# Patient Record
Sex: Female | Born: 1945
Health system: Southern US, Community
[De-identification: ages and names within clinical notes are randomized; demographics above are authoritative.]

## PROBLEM LIST (undated history)

## (undated) DIAGNOSIS — T7840XA Allergy, unspecified, initial encounter: Secondary | ICD-10-CM

## (undated) DIAGNOSIS — E785 Hyperlipidemia, unspecified: Secondary | ICD-10-CM

## (undated) DIAGNOSIS — M1990 Unspecified osteoarthritis, unspecified site: Secondary | ICD-10-CM

## (undated) DIAGNOSIS — R7303 Prediabetes: Secondary | ICD-10-CM

## (undated) DIAGNOSIS — I10 Essential (primary) hypertension: Secondary | ICD-10-CM

## (undated) HISTORY — DX: Hyperlipidemia, unspecified: E78.5

## (undated) HISTORY — DX: Prediabetes: R73.03

## (undated) HISTORY — DX: Allergy, unspecified, initial encounter: T78.40XA

## (undated) HISTORY — PX: NO PAST SURGERIES: SHX2092

## (undated) HISTORY — PX: JOINT REPLACEMENT: SHX530

## (undated) HISTORY — PX: POLYPECTOMY: SHX149

## (undated) HISTORY — PX: COLONOSCOPY: SHX174

## (undated) HISTORY — DX: Essential (primary) hypertension: I10

## (undated) HISTORY — DX: Unspecified osteoarthritis, unspecified site: M19.90

---

## 1997-12-05 ENCOUNTER — Other Ambulatory Visit: Admission: RE | Admit: 1997-12-05 | Discharge: 1997-12-05 | Payer: Self-pay | Admitting: Obstetrics & Gynecology

## 1998-08-17 ENCOUNTER — Encounter: Admission: RE | Admit: 1998-08-17 | Discharge: 1998-10-23 | Payer: Self-pay | Admitting: Family Medicine

## 1999-02-15 ENCOUNTER — Other Ambulatory Visit: Admission: RE | Admit: 1999-02-15 | Discharge: 1999-02-15 | Payer: Self-pay | Admitting: Obstetrics and Gynecology

## 1999-02-15 ENCOUNTER — Encounter (INDEPENDENT_AMBULATORY_CARE_PROVIDER_SITE_OTHER): Payer: Self-pay

## 2000-02-25 ENCOUNTER — Other Ambulatory Visit: Admission: RE | Admit: 2000-02-25 | Discharge: 2000-02-25 | Payer: Self-pay | Admitting: Obstetrics and Gynecology

## 2000-04-10 ENCOUNTER — Encounter (INDEPENDENT_AMBULATORY_CARE_PROVIDER_SITE_OTHER): Payer: Self-pay | Admitting: Specialist

## 2000-04-10 ENCOUNTER — Other Ambulatory Visit: Admission: RE | Admit: 2000-04-10 | Discharge: 2000-04-10 | Payer: Self-pay | Admitting: Internal Medicine

## 2001-02-27 ENCOUNTER — Other Ambulatory Visit: Admission: RE | Admit: 2001-02-27 | Discharge: 2001-02-27 | Payer: Self-pay

## 2002-03-15 ENCOUNTER — Other Ambulatory Visit: Admission: RE | Admit: 2002-03-15 | Discharge: 2002-03-15 | Payer: Self-pay | Admitting: Obstetrics and Gynecology

## 2003-04-13 ENCOUNTER — Other Ambulatory Visit: Admission: RE | Admit: 2003-04-13 | Discharge: 2003-04-13 | Payer: Self-pay | Admitting: Obstetrics and Gynecology

## 2004-06-13 ENCOUNTER — Other Ambulatory Visit: Admission: RE | Admit: 2004-06-13 | Discharge: 2004-06-13 | Payer: Self-pay | Admitting: Obstetrics and Gynecology

## 2004-06-26 ENCOUNTER — Ambulatory Visit: Payer: Self-pay | Admitting: Family Medicine

## 2004-07-31 ENCOUNTER — Ambulatory Visit: Payer: Self-pay | Admitting: Family Medicine

## 2004-08-07 ENCOUNTER — Ambulatory Visit: Payer: Self-pay | Admitting: Family Medicine

## 2005-04-05 ENCOUNTER — Ambulatory Visit: Payer: Self-pay | Admitting: Internal Medicine

## 2005-04-19 ENCOUNTER — Ambulatory Visit: Payer: Self-pay | Admitting: Internal Medicine

## 2005-06-26 ENCOUNTER — Ambulatory Visit: Payer: Self-pay | Admitting: Family Medicine

## 2005-07-02 ENCOUNTER — Other Ambulatory Visit: Admission: RE | Admit: 2005-07-02 | Discharge: 2005-07-02 | Payer: Self-pay | Admitting: Obstetrics and Gynecology

## 2005-07-04 ENCOUNTER — Ambulatory Visit: Payer: Self-pay | Admitting: Family Medicine

## 2006-03-24 ENCOUNTER — Ambulatory Visit: Payer: Self-pay | Admitting: Family Medicine

## 2006-06-19 ENCOUNTER — Ambulatory Visit: Payer: Self-pay | Admitting: Family Medicine

## 2006-06-19 LAB — CONVERTED CEMR LAB
ALT: 13 units/L (ref 0–40)
Albumin: 3.8 g/dL (ref 3.5–5.2)
Alkaline Phosphatase: 70 units/L (ref 39–117)
BUN: 16 mg/dL (ref 6–23)
Basophils Absolute: 0 10*3/uL (ref 0.0–0.1)
Basophils Relative: 0.8 % (ref 0.0–1.0)
CO2: 32 meq/L (ref 19–32)
Chol/HDL Ratio, serum: 3.9
GFR calc non Af Amer: 54 mL/min
Glomerular Filtration Rate, Af Am: 65 mL/min/{1.73_m2}
Glucose, Bld: 95 mg/dL (ref 70–99)
LDL DIRECT: 154.5 mg/dL
Monocytes Relative: 11.1 % — ABNORMAL HIGH (ref 3.0–11.0)
Platelets: 236 10*3/uL (ref 150–400)
Potassium: 4.3 meq/L (ref 3.5–5.1)
RBC: 4.13 M/uL (ref 3.87–5.11)
RDW: 13.2 % (ref 11.5–14.6)
Total Bilirubin: 1.3 mg/dL — ABNORMAL HIGH (ref 0.3–1.2)
Total Protein: 7.4 g/dL (ref 6.0–8.3)
Triglyceride fasting, serum: 88 mg/dL (ref 0–149)
WBC: 5.1 10*3/uL (ref 4.5–10.5)

## 2006-06-26 ENCOUNTER — Ambulatory Visit: Payer: Self-pay | Admitting: Family Medicine

## 2006-07-10 ENCOUNTER — Ambulatory Visit: Payer: Self-pay | Admitting: Family Medicine

## 2007-01-13 ENCOUNTER — Encounter: Payer: Self-pay | Admitting: Family Medicine

## 2007-08-19 ENCOUNTER — Ambulatory Visit: Payer: Self-pay | Admitting: Family Medicine

## 2007-08-19 LAB — CONVERTED CEMR LAB
Albumin: 4 g/dL (ref 3.5–5.2)
Basophils Absolute: 0 10*3/uL (ref 0.0–0.1)
Bilirubin Urine: NEGATIVE
CO2: 31 meq/L (ref 19–32)
Creatinine, Ser: 0.9 mg/dL (ref 0.4–1.2)
Direct LDL: 136.6 mg/dL
Glucose, Urine, Semiquant: NEGATIVE
HCT: 36.5 % (ref 36.0–46.0)
Hemoglobin: 12.1 g/dL (ref 12.0–15.0)
Lymphocytes Relative: 28 % (ref 12.0–46.0)
MCHC: 33.2 g/dL (ref 30.0–36.0)
Monocytes Absolute: 0.5 10*3/uL (ref 0.2–0.7)
Neutro Abs: 2.7 10*3/uL (ref 1.4–7.7)
Neutrophils Relative %: 56.5 % (ref 43.0–77.0)
Potassium: 4.4 meq/L (ref 3.5–5.1)
Protein, U semiquant: NEGATIVE
RDW: 12.7 % (ref 11.5–14.6)
Sodium: 142 meq/L (ref 135–145)
Specific Gravity, Urine: 1.015
TSH: 0.98 microintl units/mL (ref 0.35–5.50)
Total Bilirubin: 1 mg/dL (ref 0.3–1.2)
Total CHOL/HDL Ratio: 3.7
Total Protein: 7 g/dL (ref 6.0–8.3)
WBC Urine, dipstick: NEGATIVE
pH: 7

## 2007-09-10 ENCOUNTER — Ambulatory Visit: Payer: Self-pay | Admitting: Family Medicine

## 2007-09-10 DIAGNOSIS — I10 Essential (primary) hypertension: Secondary | ICD-10-CM | POA: Insufficient documentation

## 2008-10-28 ENCOUNTER — Ambulatory Visit: Payer: Self-pay | Admitting: Family Medicine

## 2008-10-28 LAB — CONVERTED CEMR LAB
ALT: 15 units/L (ref 0–35)
AST: 21 units/L (ref 0–37)
Albumin: 4 g/dL (ref 3.5–5.2)
Alkaline Phosphatase: 81 units/L (ref 39–117)
BUN: 19 mg/dL (ref 6–23)
Basophils Absolute: 0 10*3/uL (ref 0.0–0.1)
Basophils Relative: 0.7 % (ref 0.0–3.0)
Bilirubin, Direct: 0.1 mg/dL (ref 0.0–0.3)
CO2: 31 meq/L (ref 19–32)
Calcium: 9.6 mg/dL (ref 8.4–10.5)
Chloride: 108 meq/L (ref 96–112)
Cholesterol: 253 mg/dL — ABNORMAL HIGH (ref 0–200)
Creatinine, Ser: 0.9 mg/dL (ref 0.4–1.2)
Direct LDL: 146.5 mg/dL
Eosinophils Absolute: 0.2 10*3/uL (ref 0.0–0.7)
Eosinophils Relative: 3.6 % (ref 0.0–5.0)
GFR calc non Af Amer: 67.15 mL/min (ref >60)
Glucose, Bld: 96 mg/dL (ref 70–99)
HCT: 37.4 % (ref 36.0–46.0)
HDL: 50.9 mg/dL (ref >39.00)
Hemoglobin: 12.9 g/dL (ref 12.0–15.0)
Lymphocytes Relative: 30.3 % (ref 12.0–46.0)
Lymphs Abs: 1.3 10*3/uL (ref 0.7–4.0)
MCHC: 34.6 g/dL (ref 30.0–36.0)
MCV: 91.5 fL (ref 78.0–100.0)
Monocytes Absolute: 0.5 10*3/uL (ref 0.1–1.0)
Monocytes Relative: 11.9 % (ref 3.0–12.0)
Neutro Abs: 2.4 10*3/uL (ref 1.4–7.7)
Neutrophils Relative %: 53.5 % (ref 43.0–77.0)
Nitrite: NEGATIVE
Platelets: 222 10*3/uL (ref 150.0–400.0)
Potassium: 4 meq/L (ref 3.5–5.1)
RBC: 4.09 M/uL (ref 3.87–5.11)
RDW: 12.6 % (ref 11.5–14.6)
Sodium: 142 meq/L (ref 135–145)
Specific Gravity, Urine: 1.015
TSH: 1.01 microintl units/mL (ref 0.35–5.50)
Total Bilirubin: 1 mg/dL (ref 0.3–1.2)
Total CHOL/HDL Ratio: 5
Total Protein: 7.7 g/dL (ref 6.0–8.3)
Triglycerides: 203 mg/dL — ABNORMAL HIGH (ref 0.0–149.0)
VLDL: 40.6 mg/dL — ABNORMAL HIGH (ref 0.0–40.0)
WBC: 4.4 10*3/uL — ABNORMAL LOW (ref 4.5–10.5)

## 2008-11-17 ENCOUNTER — Ambulatory Visit: Payer: Self-pay | Admitting: Family Medicine

## 2008-11-17 DIAGNOSIS — I4949 Other premature depolarization: Secondary | ICD-10-CM

## 2008-11-27 DIAGNOSIS — B029 Zoster without complications: Secondary | ICD-10-CM | POA: Insufficient documentation

## 2008-11-27 HISTORY — DX: Zoster without complications: B02.9

## 2008-11-30 ENCOUNTER — Telehealth: Payer: Self-pay | Admitting: *Deleted

## 2008-11-30 ENCOUNTER — Ambulatory Visit: Payer: Self-pay | Admitting: Family Medicine

## 2008-12-02 ENCOUNTER — Ambulatory Visit: Payer: Self-pay | Admitting: Family Medicine

## 2009-03-03 ENCOUNTER — Encounter: Payer: Self-pay | Admitting: Family Medicine

## 2010-02-19 ENCOUNTER — Ambulatory Visit: Payer: Self-pay | Admitting: Family Medicine

## 2010-02-19 LAB — CONVERTED CEMR LAB
ALT: 13 units/L (ref 0–35)
Albumin: 3.9 g/dL (ref 3.5–5.2)
Basophils Relative: 0.6 % (ref 0.0–3.0)
Bilirubin Urine: NEGATIVE
CO2: 30 meq/L (ref 19–32)
Chloride: 102 meq/L (ref 96–112)
Cholesterol: 261 mg/dL — ABNORMAL HIGH (ref 0–200)
Direct LDL: 178.2 mg/dL
Eosinophils Absolute: 0.2 10*3/uL (ref 0.0–0.7)
Eosinophils Relative: 3.3 % (ref 0.0–5.0)
HCT: 37.3 % (ref 36.0–46.0)
Hemoglobin: 12.9 g/dL (ref 12.0–15.0)
MCHC: 34.4 g/dL (ref 30.0–36.0)
MCV: 92.6 fL (ref 78.0–100.0)
Monocytes Absolute: 0.7 10*3/uL (ref 0.1–1.0)
Neutro Abs: 2.6 10*3/uL (ref 1.4–7.7)
Nitrite: NEGATIVE
Potassium: 5 meq/L (ref 3.5–5.1)
RBC: 4.03 M/uL (ref 3.87–5.11)
Sodium: 140 meq/L (ref 135–145)
Total CHOL/HDL Ratio: 5
Total Protein: 6.9 g/dL (ref 6.0–8.3)
Urobilinogen, UA: 0.2
WBC: 4.9 10*3/uL (ref 4.5–10.5)

## 2010-02-26 ENCOUNTER — Encounter (INDEPENDENT_AMBULATORY_CARE_PROVIDER_SITE_OTHER): Payer: Self-pay | Admitting: *Deleted

## 2010-02-26 ENCOUNTER — Ambulatory Visit: Payer: Self-pay | Admitting: Family Medicine

## 2010-04-24 ENCOUNTER — Encounter: Payer: Self-pay | Admitting: Family Medicine

## 2010-04-27 ENCOUNTER — Encounter: Payer: Self-pay | Admitting: Family Medicine

## 2010-07-10 NOTE — Miscellaneous (Signed)
Summary: mammogram update   Clinical Lists Changes

## 2010-07-10 NOTE — Miscellaneous (Signed)
Summary: Waiver of Liability for Zostavax  Waiver of Liability for Zostavax   Imported By: Maryln Gottron 02/28/2010 10:45:23  _____________________________________________________________________  External Attachment:    Type:   Image     Comment:   External Document

## 2010-07-10 NOTE — Assessment & Plan Note (Signed)
Summary: CPX // RS   Vital Signs:  Patient profile:   65 year old female Menstrual status:  postmenopausal Height:      66.5 inches Weight:      178 pounds BMI:     28.40 Temp:     98.0 degrees F oral BP sitting:   180 / 90  (left arm) Cuff size:   regular  Vitals Entered By: Kern Reap CMA Duncan Dull) (February 26, 2010 9:08 AM) CC: cpx Is Patient Diabetic? No Pain Assessment Patient in pain? no        CC:  cpx.  History of Present Illness: Patricia Lee is a 65 year old, married female, nonsmoker, who comes in today for physical evaluation because of a history of underlying hypertension.  She takes Tenoretic 5025 dose one half tablet daily.  BP at home 130/80.  She gets routine eye care, dental care, BSE monthly, annual mammography, colonoscopy x 2 in GI normal,  tetanus this to 2007, Pneumovax 2010, patient requesting shingles.  Vaccine will be given today.  Her 3 issues were the concern about the shingles.  Vaccine.  Her blood pressure and her concern about family history of colon cancer.  She said to normal colonoscopies.  They recommend now 10-year screening.  We reviewed, why that's okay to be done every 10 years now  Preventive Screening-Counseling & Management  Alcohol-Tobacco     Smoking Status: quit     Year Quit: 20  Allergies: 1)  ! Codeine 2)  ! Prednisone 3)  ! Erythromycin  Past History:  Past medical, surgical, family and social histories (including risk factors) reviewed, and no changes noted (except as noted below).  Past Medical History: Reviewed history from 09/10/2007 and no changes required. childbirth x 2 Hypertension  Family History: Reviewed history from 09/10/2007 and no changes required. father died at 11 from colon cancer mother 61 died from stomach cancer and had bypass surgery.  Pernicious anemia, hypertension no sibs  Social History: Reviewed history from 09/10/2007 and no changes required. Retired Married Never Smoked Alcohol  use-no Drug use-no Regular exercise-yes Smoking Status:  quit  Review of Systems      See HPI       Flu Vaccine Consent Questions     Do you have a history of severe allergic reactions to this vaccine? no    Any prior history of allergic reactions to egg and/or gelatin? no    Do you have a sensitivity to the preservative Thimersol? no    Do you have a past history of Guillan-Barre Syndrome? no    Do you currently have an acute febrile illness? no    Have you ever had a severe reaction to latex? no    Vaccine information given and explained to patient? yes    Are you currently pregnant? no    Lot Number:[id number]   Exp Date:12/07/2009   Site Given  Left Deltoid IM   Physical Exam  General:  Well-developed,well-nourished,in no acute distress; alert,appropriate and cooperative throughout examination Head:  Normocephalic and atraumatic without obvious abnormalities. No apparent alopecia or balding. Eyes:  No corneal or conjunctival inflammation noted. EOMI. Perrla. Funduscopic exam benign, without hemorrhages, exudates or papilledema. Vision grossly normal. Ears:  External ear exam shows no significant lesions or deformities.  Otoscopic examination reveals clear canals, tympanic membranes are intact bilaterally without bulging, retraction, inflammation or discharge. Hearing is grossly normal bilaterally. Nose:  External nasal examination shows no deformity or inflammation. Nasal mucosa are pink and moist without  lesions or exudates. Mouth:  Oral mucosa and oropharynx without lesions or exudates.  Teeth in good repair. Neck:  No deformities, masses, or tenderness noted. Chest Wall:  No deformities, masses, or tenderness noted. Breasts:  No mass, nodules, thickening, tenderness, bulging, retraction, inflamation, nipple discharge or skin changes noted.   Lungs:  Normal respiratory effort, chest expands symmetrically. Lungs are clear to auscultation, no crackles or wheezes. Heart:  Normal  rate and regular rhythm. S1 and S2 normal without gallop, murmur, click, rub or other extra sounds. Abdomen:  Bowel sounds positive,abdomen soft and non-tender without masses, organomegaly or hernias noted. Msk:  No deformity or scoliosis noted of thoracic or lumbar spine.   Pulses:  R and L carotid,radial,femoral,dorsalis pedis and posterior tibial pulses are full and equal bilaterally Extremities:  No clubbing, cyanosis, edema, or deformity noted with normal full range of motion of all joints.   Neurologic:  No cranial nerve deficits noted. Station and gait are normal. Plantar reflexes are down-going bilaterally. DTRs are symmetrical throughout. Sensory, motor and coordinative functions appear intact. Skin:  Intact without suspicious lesions or rashes Cervical Nodes:  No lymphadenopathy noted Axillary Nodes:  No palpable lymphadenopathy Inguinal Nodes:  No significant adenopathy Psych:  Cognition and judgment appear intact. Alert and cooperative with normal attention span and concentration. No apparent delusions, illusions, hallucinations   Impression & Recommendations:  Problem # 1:  HYPERTENSION (ICD-401.9) Assessment Deteriorated  Her updated medication list for this problem includes:    Tenoretic 50 50-25 Mg Tabs (Atenolol-chlorthalidone) .Marland Kitchen... Take one half tablet daliy  Orders: Prescription Created Electronically [phone]) EKG w/ Interpretation (93000)  Problem # 2:  PHYSICAL EXAMINATION (ICD-V70.0) Assessment: Unchanged  Orders: Prescription Created Electronically [phone]) EKG w/ Interpretation (93000)  Complete Medication List: 1)  Tenoretic 50 50-25 Mg Tabs (Atenolol-chlorthalidone) .... Take one half tablet daliy 2)  Fish Oil Oil (Fish oil) .... Take one tab by mouth once daily 3)  Calcium 500 Mg Tabs (Calcium) .... Take two tabs once daily 4)  Vitamin D 1000 Unit Caps (Cholecalciferol) .... Take 2 tabs once daily 5)  Fiber Therapy 0.52 Gm Caps (Psyllium) .... Take one  tab by mouth once daily  Other Orders: Admin 1st Vaccine (10272) Flu Vaccine 63yrs + (53664) Zoster (Shingles) Vaccine Live (40347) Admin of Any Addtl Vaccine (42595)  Patient Instructions: 1)  Please schedule a follow-up appointment in 1 year. 2)  It is important that you exercise regularly at least 20 minutes 5 times a week. If you develop chest pain, have severe difficulty breathing, or feel very tired , stop exercising immediately and seek medical attention. 3)  Schedule your mammogram. 4)  Take calcium +Vitamin D daily. 5)  Take an Aspirin every day. 6)  check your blood pressure daily in the morning at home.  Return in one week with the data and the device Prescriptions: TENORETIC 50 50-25 MG  TABS (ATENOLOL-CHLORTHALIDONE) take one half tablet daliy  #100 x 4   Entered and Authorized by:   Roderick Pee MD   Signed by:   Roderick Pee MD on 02/26/2010   Method used:   Electronically to        [facility] [phone]* (retail)       [address]       [place], [place]  [address]       Ph: [phone]       Fax: [phone]   RxID:   [id number]    Immunizations  Administered:  Zostavax # 1:    Vaccine Type: Zostavax    Site: right deltoid    Mfr: Merck    Dose: 0.65    Route: [place]    Given by: Kern Reap CMA (AAMA)    Exp. Date: 01/26/2011    Lot #: [id number]    VIS given: 03/22/05 given February 26, 2010.    Physician counseled: yes

## 2010-07-10 NOTE — Miscellaneous (Signed)
Summary: bone density   Clinical Lists Changes  Observations: Added new observation of BONE DENSITY: normal (03/16/2010 12:13)      Preventive Care Screening  Bone Density:    Date:  03/16/2010    Results:  normal

## 2010-07-10 NOTE — Letter (Signed)
Summary: Colonoscopy Letter  [facility]  [address] [place], [place] [address]   Phone: [phone]  Fax: [phone]      February 26, 2010 MRN: [id number]   Patricia Lee [address] [place], [place]  [address]   Dear [name],   According to your medical record, it is time for you to schedule a Colonoscopy. The American Cancer Society recommends this procedure as a method to detect early colon cancer. Patients with a family history of colon cancer, or a personal history of colon polyps or inflammatory bowel disease are at increased risk.  This letter has beeen generated based on the recommendations made at the time of your procedure. If you feel that in your particular situation this may no longer apply, please contact our office.  Please call our office at [phone] to schedule this appointment or to update your records at your earliest convenience.  Thank you for cooperating with [place] to provide you with the very best care possible.   Sincerely,  Hedwig Morton. Juanda Chance, M.D.  [facility] [phone]

## 2010-12-03 ENCOUNTER — Encounter: Payer: Self-pay | Admitting: Internal Medicine

## 2010-12-27 ENCOUNTER — Ambulatory Visit (AMBULATORY_SURGERY_CENTER): Payer: Medicare Other | Admitting: *Deleted

## 2010-12-27 DIAGNOSIS — Z800 Family history of malignant neoplasm of digestive organs: Secondary | ICD-10-CM

## 2010-12-27 DIAGNOSIS — Z86010 Personal history of colonic polyps: Secondary | ICD-10-CM

## 2010-12-27 MED ORDER — PEG-KCL-NACL-NASULF-NA ASC-C 100 G PO SOLR: ORAL | Status: DC

## 2010-12-28 ENCOUNTER — Telehealth: Payer: Self-pay | Admitting: Internal Medicine

## 2010-12-28 NOTE — Telephone Encounter (Signed)
Moviprep called in to [facility] in [place].  Pt notified. Patricia Lee

## 2010-12-28 NOTE — Telephone Encounter (Signed)
Offered pt rebate for MoviPrep.  Will put in mail today. Patricia Lee

## 2011-01-10 ENCOUNTER — Encounter: Payer: Self-pay | Admitting: Internal Medicine

## 2011-01-10 ENCOUNTER — Ambulatory Visit (AMBULATORY_SURGERY_CENTER): Payer: Medicare Other | Admitting: Internal Medicine

## 2011-01-10 DIAGNOSIS — Z800 Family history of malignant neoplasm of digestive organs: Secondary | ICD-10-CM

## 2011-01-10 DIAGNOSIS — Z1211 Encounter for screening for malignant neoplasm of colon: Secondary | ICD-10-CM

## 2011-01-10 DIAGNOSIS — Z86010 Personal history of colonic polyps: Secondary | ICD-10-CM

## 2011-01-10 MED ORDER — SODIUM CHLORIDE 0.9 % IV SOLN: 500.0000 mL | INTRAVENOUS | Status: DC

## 2011-01-10 NOTE — Patient Instructions (Signed)
Discharge instructions given with verbal understanding. Handouts on diverticulosis and hemorrhoids given. Resume previous medications. 

## 2011-01-11 ENCOUNTER — Telehealth: Payer: Self-pay | Admitting: *Deleted

## 2011-01-11 NOTE — Telephone Encounter (Signed)

## 2011-06-19 ENCOUNTER — Encounter: Payer: Self-pay | Admitting: Family Medicine

## 2011-06-19 ENCOUNTER — Ambulatory Visit (INDEPENDENT_AMBULATORY_CARE_PROVIDER_SITE_OTHER): Payer: Medicare Other | Admitting: Family Medicine

## 2011-06-19 VITALS — BP 110/74 | Temp 98.2°F | Ht 66.5 in | Wt 182.0 lb

## 2011-06-19 DIAGNOSIS — I4949 Other premature depolarization: Secondary | ICD-10-CM

## 2011-06-19 DIAGNOSIS — I10 Essential (primary) hypertension: Secondary | ICD-10-CM

## 2011-06-19 DIAGNOSIS — Z0000 Encounter for general adult medical examination without abnormal findings: Secondary | ICD-10-CM

## 2011-06-19 LAB — HEPATIC FUNCTION PANEL
ALT: 14 U/L (ref 0–35)
Alkaline Phosphatase: 78 U/L (ref 39–117)
Bilirubin, Direct: 0 mg/dL (ref 0.0–0.3)
Total Protein: 7.6 g/dL (ref 6.0–8.3)

## 2011-06-19 LAB — POCT URINALYSIS DIPSTICK
Blood, UA: NEGATIVE
Glucose, UA: NEGATIVE
Nitrite, UA: NEGATIVE
Protein, UA: NEGATIVE
Urobilinogen, UA: 0.2

## 2011-06-19 LAB — BASIC METABOLIC PANEL
BUN: 21 mg/dL (ref 6–23)
Calcium: 9.5 mg/dL (ref 8.4–10.5)
Creatinine, Ser: 0.9 mg/dL (ref 0.4–1.2)
GFR: 69.25 mL/min (ref >60.00)
Glucose, Bld: 96 mg/dL (ref 70–99)
Sodium: 142 mEq/L (ref 135–145)

## 2011-06-19 LAB — CBC WITH DIFFERENTIAL/PLATELET
Basophils Absolute: 0 10*3/uL (ref 0.0–0.1)
Eosinophils Absolute: 0.1 10*3/uL (ref 0.0–0.7)
Hemoglobin: 12.6 g/dL (ref 12.0–15.0)
Lymphocytes Relative: 22.8 % (ref 12.0–46.0)
MCHC: 33.3 g/dL (ref 30.0–36.0)
Monocytes Absolute: 0.6 10*3/uL (ref 0.1–1.0)
Neutro Abs: 3.6 10*3/uL (ref 1.4–7.7)
Neutrophils Relative %: 64.3 % (ref 43.0–77.0)
RDW: 14.1 % (ref 11.5–14.6)

## 2011-06-19 MED ORDER — ATENOLOL-CHLORTHALIDONE 50-25 MG PO TABS: 1.0000 | ORAL_TABLET | Freq: Every day | ORAL | Status: DC

## 2011-06-19 NOTE — Progress Notes (Signed)
  Subjective:    Patient ID: Patricia Lee, female    DOB: Nov 17, 1945, 66 y.o.   MRN: [id number]  HPI   Patricia Lee s married female Nonsmoker comes in today for her first Medicare wellness examination because of a history of underlying hypertension.  She takes Tenormin 50 to 25 dose one half tab q.a.m. For high blood pressure.  BP 110/74.  She gets routine eye care, hearing normal, regular dental care, BSE monthly, and you mammography, colonoscopy, 2012 normal, seasonal flu shot 2012, Pneumovax 2010, tetanus, 2007, shingles.  Vaccine 2011.  Cognitive function, normal.  She walks on a daily basis, home health safety reviewed.  No lesion is identified, she does have guns in the house, however, the locked up.  She does not have a health care power-of-attorney, nor a living will.  It was recommended she do that this year.  She gets a pelvic examination from her gynecologist on a yearly basis, although she's had no GYN problems.    Review of Systems  Constitutional: Negative.   HENT: Negative.   Eyes: Negative.   Respiratory: Negative.   Cardiovascular: Negative.   Gastrointestinal: Negative.   Genitourinary: Negative.   Musculoskeletal: Negative.   Neurological: Negative.   Hematological: Negative.   Psychiatric/Behavioral: Negative.        Objective:   Physical Exam  Constitutional: She appears well-developed and well-nourished.  HENT:  Head: Normocephalic and atraumatic.  Right Ear: External ear normal.  Left Ear: External ear normal.  Nose: Nose normal.  Mouth/Throat: Oropharynx is clear and moist.  Eyes: EOM are normal. Pupils are equal, round, and reactive to light.  Neck: Normal range of motion. Neck supple. No thyromegaly present.  Cardiovascular: Normal rate, regular rhythm, normal heart sounds and intact distal pulses.  Exam reveals no gallop and no friction rub.   No murmur heard. Pulmonary/Chest: Effort normal and breath sounds normal.  Abdominal: Soft. Bowel sounds are  normal. She exhibits no distension and no mass. There is no tenderness. There is no rebound.  Genitourinary:       Bilateral breast exam normal  Musculoskeletal: Normal range of motion.  Lymphadenopathy:    She has no cervical adenopathy.  Neurological: She is alert. She has normal reflexes. No cranial nerve deficit. She exhibits normal muscle tone. Coordination normal.  Skin: Skin is warm and dry.       Total body skin exam normal.  She has numerous seborrheic keratoses  Psychiatric: She has a normal mood and affect. Her behavior is normal. Judgment and thought content normal.          Assessment & Plan:  Healthy female.  Hypertension.  Continue Tenoretic one half tab daily.  Return in one year, sooner for any problems

## 2011-06-19 NOTE — Patient Instructions (Signed)
Continue your current medications.  Remember to take a baby aspirin daily.  Continued your walking and swimming.  Return in one year, sooner if any problems

## 2011-07-11 ENCOUNTER — Telehealth: Payer: Self-pay | Admitting: Family Medicine

## 2011-07-11 NOTE — Telephone Encounter (Signed)
Pt need blood work result from 06-19-2011

## 2011-07-11 NOTE — Telephone Encounter (Signed)
Please call her about her labs.......... add Zocor 20 mg each bedtime followup fasting lipid panel in 2 months office visit one week after lab draw dispense 100  tabs 3 refills

## 2011-07-12 MED ORDER — SIMVASTATIN 20 MG PO TABS: 20.0000 mg | ORAL_TABLET | Freq: Every day | ORAL | Status: DC

## 2011-07-12 NOTE — Telephone Encounter (Signed)
Left message on machine for patient with lab results 

## 2012-04-23 ENCOUNTER — Telehealth: Payer: Self-pay | Admitting: Family Medicine

## 2012-04-23 ENCOUNTER — Ambulatory Visit (INDEPENDENT_AMBULATORY_CARE_PROVIDER_SITE_OTHER): Payer: Medicare Other | Admitting: Family Medicine

## 2012-04-23 ENCOUNTER — Encounter: Payer: Self-pay | Admitting: Family Medicine

## 2012-04-23 VITALS — BP 160/90 | Temp 97.9°F | Wt 180.0 lb

## 2012-04-23 DIAGNOSIS — N3090 Cystitis, unspecified without hematuria: Secondary | ICD-10-CM

## 2012-04-23 DIAGNOSIS — R300 Dysuria: Secondary | ICD-10-CM

## 2012-04-23 DIAGNOSIS — E785 Hyperlipidemia, unspecified: Secondary | ICD-10-CM

## 2012-04-23 LAB — POCT URINALYSIS DIPSTICK
Ketones, UA: NEGATIVE
Protein, UA: NEGATIVE
Urobilinogen, UA: 0.2

## 2012-04-23 MED ORDER — SULFAMETHOXAZOLE-TRIMETHOPRIM 800-160 MG PO TABS: 1.0000 | ORAL_TABLET | Freq: Two times a day (BID) | ORAL | Status: DC

## 2012-04-23 NOTE — Patient Instructions (Addendum)
Drink lots of water  Avoid hot tubs  Septra 1 twice daily

## 2012-04-23 NOTE — Progress Notes (Signed)
  Subjective:    Patient ID: Patricia Lee, female    DOB: 01-13-1946, 66 y.o.   MRN: [id number]  HPI  Patricia Lee is a 66 year old married female nonsmoker who comes in with a two-day history of dysuria and frequency  She's never had a urinary tract infection in the past  She recently went on a cruise and was in a hot tub every day  Review of Systems    general and UA tract review of systems otherwise negative no fever chills or back pain Objective:   Physical Exam  Well-developed well-nourished female no acute distress abdominal exam was negative urinalysis shows large blood moderate white cells      Assessment & Plan:  Urinary tract infection plan Septra DS twice a day  Hyperlipidemia,,,,,,,,,, we gave her prescription for Zocor last year because her LDL is in the 180 range. She never took it she's been trying to manage it with diet and exercise

## 2012-04-23 NOTE — Telephone Encounter (Signed)
Caller: [name]/Patient; Patient Name: Patricia Lee; PCP: Kelle Darting [facility]); Best Callback Phone Number: [phone] Onset: 04/21/12 started with urinary frequency and pressure. 04/23/12 Afebrile Urinary frequency, urgency, pressure/ pain at end of urination.  Urgent symptom of "Has one or more urinary tract symptom and has not been previously evaluated" per Urinary Symptoms - Female.  Appointment scheduled for 04/23/12 at 12:00 with Dr. Tawanna Cooler for evaluation.  Patient questioned as to whether or not she needs to go to lab prior to appointment for specimen collection of urine. OFFICE NOTE PLEASE FOLLOW UP WITH PATIENT AS TO WHETHER OR NOT SHE NEEDS TO GO TO LAB PRIOR TO APPOINTMENT.  PATIENT HAS ANOTHER APPT AT 2:00 TODAY THAT SHE CANNOT MISS.

## 2012-07-02 ENCOUNTER — Encounter: Payer: Medicare Other | Admitting: Family Medicine

## 2012-07-21 ENCOUNTER — Other Ambulatory Visit (INDEPENDENT_AMBULATORY_CARE_PROVIDER_SITE_OTHER): Payer: Medicare Other

## 2012-07-21 DIAGNOSIS — I10 Essential (primary) hypertension: Secondary | ICD-10-CM

## 2012-07-21 DIAGNOSIS — E785 Hyperlipidemia, unspecified: Secondary | ICD-10-CM

## 2012-07-21 DIAGNOSIS — Z0000 Encounter for general adult medical examination without abnormal findings: Secondary | ICD-10-CM

## 2012-07-21 LAB — CBC WITH DIFFERENTIAL/PLATELET
Basophils Relative: 0.7 % (ref 0.0–3.0)
Eosinophils Relative: 3.5 % (ref 0.0–5.0)
Hemoglobin: 12.4 g/dL (ref 12.0–15.0)
Lymphocytes Relative: 25 % (ref 12.0–46.0)
Monocytes Relative: 17.5 % — ABNORMAL HIGH (ref 3.0–12.0)
Neutro Abs: 2.9 10*3/uL (ref 1.4–7.7)
RBC: 4.16 Mil/uL (ref 3.87–5.11)

## 2012-07-21 LAB — LDL CHOLESTEROL, DIRECT: Direct LDL: 136.2 mg/dL

## 2012-07-21 LAB — BASIC METABOLIC PANEL
CO2: 29 mEq/L (ref 19–32)
Calcium: 9.4 mg/dL (ref 8.4–10.5)
GFR: 62.36 mL/min (ref >60.00)
Sodium: 136 mEq/L (ref 135–145)

## 2012-07-21 LAB — LIPID PANEL
Total CHOL/HDL Ratio: 4
Triglycerides: 129 mg/dL (ref 0.0–149.0)

## 2012-07-21 LAB — POCT URINALYSIS DIPSTICK
Blood, UA: NEGATIVE
Glucose, UA: NEGATIVE
Protein, UA: NEGATIVE
Spec Grav, UA: 1.015
Urobilinogen, UA: 0.2

## 2012-07-21 LAB — HEPATIC FUNCTION PANEL
AST: 19 U/L (ref 0–37)
Albumin: 3.7 g/dL (ref 3.5–5.2)
Alkaline Phosphatase: 80 U/L (ref 39–117)
Total Protein: 7.6 g/dL (ref 6.0–8.3)

## 2012-07-25 ENCOUNTER — Other Ambulatory Visit: Payer: Self-pay

## 2012-07-28 ENCOUNTER — Encounter: Payer: Self-pay | Admitting: Family Medicine

## 2012-07-28 ENCOUNTER — Ambulatory Visit (INDEPENDENT_AMBULATORY_CARE_PROVIDER_SITE_OTHER): Payer: Medicare Other | Admitting: Family Medicine

## 2012-07-28 VITALS — BP 140/90 | Temp 98.6°F | Ht 66.0 in | Wt 180.0 lb

## 2012-07-28 DIAGNOSIS — E785 Hyperlipidemia, unspecified: Secondary | ICD-10-CM

## 2012-07-28 DIAGNOSIS — I4949 Other premature depolarization: Secondary | ICD-10-CM

## 2012-07-28 DIAGNOSIS — I10 Essential (primary) hypertension: Secondary | ICD-10-CM

## 2012-07-28 DIAGNOSIS — Z85828 Personal history of other malignant neoplasm of skin: Secondary | ICD-10-CM

## 2012-07-28 MED ORDER — ATENOLOL-CHLORTHALIDONE 50-25 MG PO TABS: ORAL_TABLET | ORAL | Status: DC

## 2012-07-28 NOTE — Patient Instructions (Signed)
Continue the Tenoretic daily  Motrin 400 twice daily with food and for your a sore thumb  Continue your diet and exercise program  Return in one year sooner if any problems  Remember to do a thorough breast and skin exam a monthly

## 2012-07-28 NOTE — Progress Notes (Signed)
  Subjective:    Patient ID: Patricia Lee, female    DOB: 01-07-1946, 67 y.o.   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 a couple years ago by me  She states she feels well and has no complaints  She's going to the [facility] 3 times weekly and the days that she doesn't she walks.she never took the Zocor she's normalized her lipids with diet exercise  She gets routine eye care, dental care, BSE monthly, and you mammography, colonoscopy 2013,  Cognitive function normal she exercises on a regular basis home health safety reviewed no issues identified, no guns in the house, she does have a health care power of attorney and living well  Mood good no history of depression   Review of Systems  Constitutional: Negative.   HENT: Negative.   Eyes: Negative.   Respiratory: Negative.   Cardiovascular: Negative.   Gastrointestinal: Negative.   Genitourinary: Negative.   Musculoskeletal: Negative.   Neurological: Negative.   Psychiatric/Behavioral: Negative.        Objective:   Physical Exam  Constitutional: She appears well-developed and well-nourished.  HENT:  Head: Normocephalic and atraumatic.  Right Ear: External ear normal.  Left Ear: External ear normal.  Nose: Nose normal.  Mouth/Throat: Oropharynx is clear and moist.  Eyes: EOM are normal. Pupils are equal, round, and reactive to light.  Neck: Normal range of motion. Neck supple. No thyromegaly present.  Cardiovascular: Normal rate, regular rhythm, normal heart sounds and intact distal pulses.  Exam reveals no gallop and no friction rub.   No murmur heard. Pulmonary/Chest: Effort normal and breath sounds normal.  Abdominal: Soft. Bowel sounds are normal. She exhibits no distension and no mass. There is no tenderness. There is  no rebound.  Genitourinary:  Bilateral breast exam normal Pap smear every 3 years  Musculoskeletal: Normal range of motion.  Lymphadenopathy:    She has no cervical adenopathy.  Neurological: She is alert. She has normal reflexes. No cranial nerve deficit. She exhibits normal muscle tone. Coordination normal.  Skin: Skin is warm and dry.  Psychiatric: She has a normal mood and affect. Her behavior is normal. Judgment and thought content normal.          Assessment & Plan:  Healthy female  Hypertension at goal continue current medication  Osteoarthritis right thumb Motrin 400 twice a day  Hyperlipidemia continue ,,,,,,,continue diet exercise he's normalized her lipids with that and never took the medication  History of skin cancer continue sunscreens

## 2013-01-13 ENCOUNTER — Other Ambulatory Visit: Payer: Self-pay

## 2013-04-15 ENCOUNTER — Other Ambulatory Visit: Payer: Self-pay

## 2013-08-12 ENCOUNTER — Other Ambulatory Visit (INDEPENDENT_AMBULATORY_CARE_PROVIDER_SITE_OTHER): Payer: Medicare Other

## 2013-08-12 DIAGNOSIS — E785 Hyperlipidemia, unspecified: Secondary | ICD-10-CM

## 2013-08-12 DIAGNOSIS — I10 Essential (primary) hypertension: Secondary | ICD-10-CM

## 2013-08-12 DIAGNOSIS — Z0000 Encounter for general adult medical examination without abnormal findings: Secondary | ICD-10-CM

## 2013-08-12 LAB — HEPATIC FUNCTION PANEL
ALBUMIN: 3.2 g/dL — AB (ref 3.5–5.2)
ALK PHOS: 66 U/L (ref 39–117)
ALT: 12 U/L (ref 0–35)
AST: 17 U/L (ref 0–37)
Bilirubin, Direct: 0.1 mg/dL (ref 0.0–0.3)
TOTAL PROTEIN: 7.2 g/dL (ref 6.0–8.3)
Total Bilirubin: 0.7 mg/dL (ref 0.3–1.2)

## 2013-08-12 LAB — CBC WITH DIFFERENTIAL/PLATELET
BASOS ABS: 0 10*3/uL (ref 0.0–0.1)
BASOS PCT: 0.3 % (ref 0.0–3.0)
EOS PCT: 3.8 % (ref 0.0–5.0)
Eosinophils Absolute: 0.2 10*3/uL (ref 0.0–0.7)
HEMATOCRIT: 34 % — AB (ref 36.0–46.0)
HEMOGLOBIN: 11.1 g/dL — AB (ref 12.0–15.0)
LYMPHS ABS: 1.5 10*3/uL (ref 0.7–4.0)
LYMPHS PCT: 27.7 % (ref 12.0–46.0)
MCHC: 32.8 g/dL (ref 30.0–36.0)
MCV: 88.9 fl (ref 78.0–100.0)
MONOS PCT: 8.8 % (ref 3.0–12.0)
Monocytes Absolute: 0.5 10*3/uL (ref 0.1–1.0)
NEUTROS ABS: 3.3 10*3/uL (ref 1.4–7.7)
Neutrophils Relative %: 59.4 % (ref 43.0–77.0)
Platelets: 372 10*3/uL (ref 150.0–400.0)
RBC: 3.82 Mil/uL — AB (ref 3.87–5.11)
RDW: 14.6 % (ref 11.5–14.6)
WBC: 5.6 10*3/uL (ref 4.5–10.5)

## 2013-08-12 LAB — LIPID PANEL
CHOL/HDL RATIO: 4
Cholesterol: 199 mg/dL (ref 0–200)
HDL: 56.8 mg/dL (ref >39.00)
LDL CALC: 125 mg/dL — AB (ref 0–99)
Triglycerides: 85 mg/dL (ref 0.0–149.0)
VLDL: 17 mg/dL (ref 0.0–40.0)

## 2013-08-12 LAB — POCT URINALYSIS DIPSTICK
Bilirubin, UA: NEGATIVE
Blood, UA: NEGATIVE
Glucose, UA: NEGATIVE
Ketones, UA: NEGATIVE
NITRITE UA: NEGATIVE
PH UA: 5.5
PROTEIN UA: NEGATIVE
Spec Grav, UA: 1.025
UROBILINOGEN UA: 0.2

## 2013-08-12 LAB — BASIC METABOLIC PANEL
BUN: 15 mg/dL (ref 6–23)
CO2: 26 mEq/L (ref 19–32)
Calcium: 9.3 mg/dL (ref 8.4–10.5)
Chloride: 105 mEq/L (ref 96–112)
Creatinine, Ser: 0.8 mg/dL (ref 0.4–1.2)
GFR: 71.64 mL/min (ref >60.00)
GLUCOSE: 84 mg/dL (ref 70–99)
POTASSIUM: 4.7 meq/L (ref 3.5–5.1)
SODIUM: 138 meq/L (ref 135–145)

## 2013-08-12 LAB — TSH: TSH: 1.59 u[IU]/mL (ref 0.35–5.50)

## 2013-08-19 ENCOUNTER — Ambulatory Visit (INDEPENDENT_AMBULATORY_CARE_PROVIDER_SITE_OTHER): Payer: Medicare Other | Admitting: Family Medicine

## 2013-08-19 ENCOUNTER — Encounter: Payer: Self-pay | Admitting: Family Medicine

## 2013-08-19 VITALS — BP 158/84 | HR 78 | Temp 98.6°F | Resp 20 | Ht 66.0 in | Wt 174.0 lb

## 2013-08-19 DIAGNOSIS — I4949 Other premature depolarization: Secondary | ICD-10-CM

## 2013-08-19 DIAGNOSIS — Z23 Encounter for immunization: Secondary | ICD-10-CM

## 2013-08-19 DIAGNOSIS — I10 Essential (primary) hypertension: Secondary | ICD-10-CM

## 2013-08-19 DIAGNOSIS — Z85828 Personal history of other malignant neoplasm of skin: Secondary | ICD-10-CM

## 2013-08-19 DIAGNOSIS — E785 Hyperlipidemia, unspecified: Secondary | ICD-10-CM

## 2013-08-19 MED ORDER — LOSARTAN POTASSIUM 50 MG PO TABS: 50.0000 mg | ORAL_TABLET | Freq: Every day | ORAL | Status: DC

## 2013-08-19 NOTE — Addendum Note (Signed)
Addended by: Marian Sorrow on: 08/19/2013 04:52 PM   Modules accepted: Orders

## 2013-08-19 NOTE — Patient Instructions (Addendum)
Cozaar 50 mg........ one half tab daily in the morning  Check your blood pressure daily in the morning  Return in one month for followup  Remember to walk 30 minutes daily

## 2013-08-19 NOTE — Progress Notes (Signed)
Pre-visit discussion using our clinic review tool. No additional management support is needed unless otherwise documented below in the visit note.  

## 2013-08-19 NOTE — Progress Notes (Signed)
   Subjective:    Patient ID: Patricia Lee, female    DOB: 1945/12/11, 68 y.o.   MRN: [id number]  HPI Patricia Lee is a 68 year old recently widowed female,,,,,,, her husband Dominica Severin died in the fall following sinus surgery. He was on Coumadin because of A. fib. The Coumadin was discontinued he did well however postop he flipped to clots. He was readmitted started on IV heparin and discharged on Lovenox. The next day had a massive stroke and subsequently died,,,,,,,,, who comes in today for a Medicare wellness examination  She takes Motrin 400 mg daily for joint pain. She's been quartering her Tenoretic because of side effects. BP 150/84.  She gets routine eye care, dental care, BSE monthly, and you mammography, colonoscopy over 5 years because her father had colon cancer. Cognitive function normal she walks on a regular basis home health safety reviewed no issues identified, no guns in the house, she does have a health care power of attorney and living well  Vaccinations up-to-date with Pneumovax 13   Review of Systems  Constitutional: Negative.   HENT: Negative.   Eyes: Negative.   Respiratory: Negative.   Cardiovascular: Negative.   Gastrointestinal: Negative.   Genitourinary: Negative.   Musculoskeletal: Negative.   Neurological: Negative.   Psychiatric/Behavioral: Negative.        Objective:   Physical Exam  Nursing note and vitals reviewed. Constitutional: She appears well-developed and well-nourished.  HENT:  Head: Normocephalic and atraumatic.  Right Ear: External ear normal.  Left Ear: External ear normal.  Nose: Nose normal.  Mouth/Throat: Oropharynx is clear and moist.  Eyes: EOM are normal. Pupils are equal, round, and reactive to light.  Neck: Normal range of motion. Neck supple. No thyromegaly present.  Cardiovascular: Normal rate, regular rhythm, normal heart sounds and intact distal pulses.  Exam reveals no gallop and no friction rub.   No murmur heard. Pulmonary/Chest:  Effort normal and breath sounds normal.  Abdominal: Soft. Bowel sounds are normal. She exhibits no distension and no mass. There is no tenderness. There is no rebound.  Genitourinary:  Bilateral breast exam shows multiple small fibrocystic changes. She's aware of these changes and does BSE monthly.  She went through menopause uneventfully Pap smears have been normal she's on a 3-5 years cycle of check ups  Musculoskeletal: Normal range of motion.  Lymphadenopathy:    She has no cervical adenopathy.  Neurological: She is alert. She has normal reflexes. No cranial nerve deficit. She exhibits normal muscle tone. Coordination normal.  Skin: Skin is warm and dry.  Total body skin exam normal  Psychiatric: She has a normal mood and affect. Her behavior is normal. Judgment and thought content normal.          Assessment & Plan:

## 2013-08-20 ENCOUNTER — Telehealth: Payer: Self-pay | Admitting: Family Medicine

## 2013-08-20 NOTE — Telephone Encounter (Signed)
Relevant patient education assigned to patient using Emmi. ° °

## 2013-09-20 ENCOUNTER — Encounter: Payer: Self-pay | Admitting: Family Medicine

## 2013-09-20 ENCOUNTER — Ambulatory Visit (INDEPENDENT_AMBULATORY_CARE_PROVIDER_SITE_OTHER): Payer: Medicare Other | Admitting: Family Medicine

## 2013-09-20 VITALS — BP 140/80 | Temp 98.8°F | Wt 174.0 lb

## 2013-09-20 DIAGNOSIS — I10 Essential (primary) hypertension: Secondary | ICD-10-CM

## 2013-09-20 NOTE — Patient Instructions (Signed)
Continue medication daily  Walk 30 minutes daily and avoid salt  Check your BP weekly  Followup in March 2015 for your annual physical exam

## 2013-09-20 NOTE — Progress Notes (Signed)
Pre visit review using our clinic review tool, if applicable. No additional management support is needed unless otherwise documented below in the visit note. 

## 2013-09-20 NOTE — Progress Notes (Signed)
   Subjective:    Patient ID: Renato Shin, female    DOB: [birth year], 68 y.o.   MRN: [id number]  HPI  Rital is a 68 year old nonsmoking female ,,,,,,, recently widowed her husband died after surgery from a clot ,,,,,, who comes in today for followup of hypertension her BP on losartan 50 mg daily for 140/80  Her blood pressures at home are indeed a little bit lower  No side effects from medication    Review of Systems     review of systems otherwise negative Objective:   Physical Exam  well-developed well-nourished female no acute distress vital signs stable she's afebrile BP 140/80       Assessment & Plan:  Hypertension adult continue current therapy followup in March 2015

## 2014-03-25 ENCOUNTER — Ambulatory Visit (INDEPENDENT_AMBULATORY_CARE_PROVIDER_SITE_OTHER): Payer: Medicare Other | Admitting: Family Medicine

## 2014-03-25 ENCOUNTER — Telehealth: Payer: Self-pay | Admitting: Family Medicine

## 2014-03-25 ENCOUNTER — Encounter: Payer: Self-pay | Admitting: Family Medicine

## 2014-03-25 VITALS — BP 169/103 | HR 88 | Temp 98.5°F | Ht 66.0 in | Wt 178.0 lb

## 2014-03-25 DIAGNOSIS — M549 Dorsalgia, unspecified: Secondary | ICD-10-CM

## 2014-03-25 DIAGNOSIS — G629 Polyneuropathy, unspecified: Secondary | ICD-10-CM

## 2014-03-25 LAB — POCT URINALYSIS DIPSTICK
BILIRUBIN UA: NEGATIVE
Glucose, UA: NEGATIVE
Ketones, UA: NEGATIVE
NITRITE UA: NEGATIVE
Protein, UA: NEGATIVE
RBC UA: NEGATIVE
Spec Grav, UA: <=1.005
UROBILINOGEN UA: 0.2
pH, UA: 6

## 2014-03-25 MED ORDER — GABAPENTIN 300 MG PO CAPS
300.0000 mg | ORAL_CAPSULE | Freq: Three times a day (TID) | ORAL | Status: DC
Start: 2014-03-25 — End: 2014-08-23

## 2014-03-25 NOTE — Progress Notes (Signed)
   Subjective:    Patient ID: Patricia Lee, female    DOB: May 13, 1946, 68 y.o.   MRN: [id number]  HPI Here for a possible reaction to the flu shot she received on 03-22-14. She has had these for years and has done well. However the day after her shot she developed burning type pains all over her body, especially in the back. This has been on both sides of the body and even involves the arms and legs. She also has some intermittent numbness and tingling in the legs. No weakness. No fever or any infectious symptoms. Using Ibuprofen with no relief. Hot or cold both make her feel worse.    Review of Systems  Constitutional: Negative.   Respiratory: Negative.   Cardiovascular: Negative.   Musculoskeletal: Positive for myalgias.  Neurological: Positive for numbness. Negative for dizziness, tremors, seizures, syncope, facial asymmetry, speech difficulty, weakness, light-headedness and headaches.       Objective:   Physical Exam  Constitutional: She appears well-developed and well-nourished. No distress.  Cardiovascular: Normal rate, regular rhythm, normal heart sounds and intact distal pulses.   Pulmonary/Chest: Effort normal and breath sounds normal.  Musculoskeletal:  Mildly tender throughout the entire back, full ROM of spine, the extremities are normal           Assessment & Plan:  These symptoms are quite unusual but they do seem be neurologic in nature. Most likely this does represent a reaction to the flu shot. She will continue to use Ibuprofen prn and we will add Gabapentin 300 mg tid. Recheck next week

## 2014-03-25 NOTE — Addendum Note (Signed)
Addended by: Aggie Hacker A on: 03/25/2014 02:03 PM   Modules accepted: Orders

## 2014-03-25 NOTE — Progress Notes (Signed)
Pre visit review using our clinic review tool, if applicable. No additional management support is needed unless otherwise documented below in the visit note. 

## 2014-03-25 NOTE — Telephone Encounter (Signed)
Patient Information:  Caller Name: Floyd  Phone: [phone]  Patient: Patricia Lee, Patricia Lee  Gender: Female  DOB: 25-Oct-1945  Age: 68 Years  PCP: Stevie Kern [facility])  Office Follow Up:  Does the office need to follow up with this patient?: No  Instructions For The Office: N/A  RN Note:  Pt has an appt at 11am. She was transferred to the triage to make sure that this was an appropriate time.  Symptoms  Reason For Call & Symptoms: Pt received a flu shot on Tuesday 03/22/14 and by Tuesday evening she had back pain. By Wed am (03/23/14) it had settled on the right back kidney area and then settled in her spine. Pt states it is intermittent. No fever. She is convinced this is from the flu shot. She describes the pain as severe. Has a hx of multiple reactions to medications "my body doesn't handle medication very well". The pain is minimized when she is up walking which is what she is doing. This am she feels she is shaking a little as if she has fever but she is afebrile. Pt has an appt scheduled at 11 am with Dr. Sarajane Jews. RN advised pt maintain that appt time.   Reviewed Health History In EMR: Yes  Reviewed Medications In EMR: Yes  Reviewed Allergies In EMR: Yes  Reviewed Surgeries / Procedures: Yes  Date of Onset of Symptoms: 03/22/2014  Treatments Tried: Pt took Tylenol 1000mg  this am at 07:30 (she has been taking this QID).  Treatments Tried Worked: No  Guideline(s) Used:  Back Pain  Disposition Per Guideline:   Go to Office Now  Reason For Disposition Reached:   Severe back pain  Advice Given:  N/A  Patient Will Follow Care Advice:  YES  Appointment Scheduled:  03/25/2014 11:00:00 Appointment Scheduled Provider:  Alysia Penna [facility])

## 2014-04-02 ENCOUNTER — Encounter: Payer: Self-pay | Admitting: Family Medicine

## 2014-04-04 MED ORDER — HYDROCHLOROTHIAZIDE 25 MG PO TABS: 25.0000 mg | ORAL_TABLET | Freq: Every day | ORAL | Status: DC

## 2014-07-07 DIAGNOSIS — Z1231 Encounter for screening mammogram for malignant neoplasm of breast: Secondary | ICD-10-CM | POA: Diagnosis not present

## 2014-07-07 LAB — HM MAMMOGRAPHY

## 2014-07-14 ENCOUNTER — Encounter: Payer: Self-pay | Admitting: Family Medicine

## 2014-07-26 ENCOUNTER — Other Ambulatory Visit: Payer: Self-pay | Admitting: Family Medicine

## 2014-08-16 ENCOUNTER — Other Ambulatory Visit (INDEPENDENT_AMBULATORY_CARE_PROVIDER_SITE_OTHER): Payer: Medicare Other

## 2014-08-16 DIAGNOSIS — I10 Essential (primary) hypertension: Secondary | ICD-10-CM | POA: Diagnosis not present

## 2014-08-16 LAB — CBC WITH DIFFERENTIAL/PLATELET
BASOS ABS: 0 10*3/uL (ref 0.0–0.1)
Basophils Relative: 0.6 % (ref 0.0–3.0)
EOS ABS: 0.2 10*3/uL (ref 0.0–0.7)
Eosinophils Relative: 3.4 % (ref 0.0–5.0)
HCT: 35 % — ABNORMAL LOW (ref 36.0–46.0)
Hemoglobin: 11.8 g/dL — ABNORMAL LOW (ref 12.0–15.0)
LYMPHS ABS: 1.3 10*3/uL (ref 0.7–4.0)
Lymphocytes Relative: 27.1 % (ref 12.0–46.0)
MCHC: 33.6 g/dL (ref 30.0–36.0)
MCV: 88.8 fl (ref 78.0–100.0)
MONO ABS: 0.5 10*3/uL (ref 0.1–1.0)
Monocytes Relative: 11.2 % (ref 3.0–12.0)
NEUTROS PCT: 57.7 % (ref 43.0–77.0)
Neutro Abs: 2.8 10*3/uL (ref 1.4–7.7)
PLATELETS: 259 10*3/uL (ref 150.0–400.0)
RBC: 3.95 Mil/uL (ref 3.87–5.11)
RDW: 14.1 % (ref 11.5–15.5)
WBC: 4.8 10*3/uL (ref 4.0–10.5)

## 2014-08-16 LAB — LIPID PANEL
Cholesterol: 208 mg/dL — ABNORMAL HIGH (ref 0–200)
HDL: 59.6 mg/dL (ref >39.00)
LDL Cholesterol: 124 mg/dL — ABNORMAL HIGH (ref 0–99)
NonHDL: 148.4
TRIGLYCERIDES: 120 mg/dL (ref 0.0–149.0)
Total CHOL/HDL Ratio: 3
VLDL: 24 mg/dL (ref 0.0–40.0)

## 2014-08-16 LAB — BASIC METABOLIC PANEL
BUN: 25 mg/dL — ABNORMAL HIGH (ref 6–23)
CALCIUM: 9.6 mg/dL (ref 8.4–10.5)
CO2: 31 mEq/L (ref 19–32)
Chloride: 102 mEq/L (ref 96–112)
Creatinine, Ser: 1.06 mg/dL (ref 0.40–1.20)
GFR: 54.61 mL/min — ABNORMAL LOW (ref >60.00)
Glucose, Bld: 90 mg/dL (ref 70–99)
POTASSIUM: 4.6 meq/L (ref 3.5–5.1)
SODIUM: 137 meq/L (ref 135–145)

## 2014-08-16 LAB — POCT URINALYSIS DIPSTICK
Bilirubin, UA: NEGATIVE
Blood, UA: NEGATIVE
GLUCOSE UA: NEGATIVE
Ketones, UA: NEGATIVE
NITRITE UA: NEGATIVE
PROTEIN UA: NEGATIVE
Spec Grav, UA: 1.01
Urobilinogen, UA: 0.2
pH, UA: 5.5

## 2014-08-16 LAB — HEPATIC FUNCTION PANEL
ALK PHOS: 83 U/L (ref 39–117)
ALT: 10 U/L (ref 0–35)
AST: 16 U/L (ref 0–37)
Albumin: 4 g/dL (ref 3.5–5.2)
Bilirubin, Direct: 0.2 mg/dL (ref 0.0–0.3)
TOTAL PROTEIN: 7.5 g/dL (ref 6.0–8.3)
Total Bilirubin: 0.7 mg/dL (ref 0.2–1.2)

## 2014-08-16 LAB — TSH: TSH: 1.51 u[IU]/mL (ref 0.35–4.50)

## 2014-08-18 ENCOUNTER — Other Ambulatory Visit: Payer: Self-pay | Admitting: Family Medicine

## 2014-08-23 ENCOUNTER — Ambulatory Visit (INDEPENDENT_AMBULATORY_CARE_PROVIDER_SITE_OTHER): Payer: Medicare Other | Admitting: Family Medicine

## 2014-08-23 ENCOUNTER — Encounter: Payer: Self-pay | Admitting: Family Medicine

## 2014-08-23 VITALS — BP 140/90 | Temp 97.8°F | Ht 66.5 in | Wt 179.0 lb

## 2014-08-23 DIAGNOSIS — E785 Hyperlipidemia, unspecified: Secondary | ICD-10-CM

## 2014-08-23 DIAGNOSIS — Z0000 Encounter for general adult medical examination without abnormal findings: Secondary | ICD-10-CM | POA: Diagnosis not present

## 2014-08-23 DIAGNOSIS — I10 Essential (primary) hypertension: Secondary | ICD-10-CM | POA: Diagnosis not present

## 2014-08-23 MED ORDER — HYDROCHLOROTHIAZIDE 25 MG PO TABS: 25.0000 mg | ORAL_TABLET | Freq: Every day | ORAL | Status: DC

## 2014-08-23 MED ORDER — LOSARTAN POTASSIUM 50 MG PO TABS: 50.0000 mg | ORAL_TABLET | Freq: Every day | ORAL | Status: DC

## 2014-08-23 NOTE — Progress Notes (Signed)
Pre visit review using our clinic review tool, if applicable. No additional management support is needed unless otherwise documented below in the visit note. 

## 2014-08-23 NOTE — Progress Notes (Signed)
   Subjective:    Patient ID: Patricia Lee, female    DOB: 06/23/1945, 69 y.o.   MRN: [id number]  HPI [name] is a 69 year old female nonsmoker who comes in today for general physical examination because of a history of hypertension   Her blood pressure is 140/90 on hydrochlorothiazide 25 mg daily and losartan 50 mg daily.   She tells me after she had the flu shot last fall within 24 hours she had some back pain. She came in and saw Dr. Sarajane Jews. He told her was related to the shot. He gave her Neurontin went away.   She gets routine eye care, dental care, BSE monthly, annual mammography, colonoscopy 2012   Vaccinations up-to-date  Cognitive function normal she exercises on a daily basis home health safety reviewed no issues identified, no guns in the house, she does have a healthcare power of attorney and living will.    Review of Systems  Constitutional: Negative.   HENT: Negative.   Eyes: Negative.   Respiratory: Negative.   Cardiovascular: Negative.   Gastrointestinal: Negative.   Endocrine: Negative.   Genitourinary: Negative.   Musculoskeletal: Negative.   Skin: Negative.   Allergic/Immunologic: Negative.   Neurological: Negative.   Hematological: Negative.   Psychiatric/Behavioral: Negative.        Objective:   Physical Exam  Constitutional: She appears well-developed and well-nourished.  HENT:  Head: Normocephalic and atraumatic.  Right Ear: External ear normal.  Left Ear: External ear normal.  Nose: Nose normal.  Mouth/Throat: Oropharynx is clear and moist.  Eyes: EOM are normal. Pupils are equal, round, and reactive to light.  Neck: Normal range of motion. Neck supple. No JVD present. No tracheal deviation present. No thyromegaly present.  Cardiovascular: Normal rate, regular rhythm, normal heart sounds and intact distal pulses.  Exam reveals no gallop and no friction rub.   No murmur heard. Pulmonary/Chest: Effort normal and breath sounds normal. No stridor. No  respiratory distress. She has no wheezes. She has no rales. She exhibits no tenderness.  Abdominal: Soft. Bowel sounds are normal. She exhibits no distension and no mass. There is no tenderness. There is no rebound and no guarding.  Genitourinary:   Bilateral breast exam normal   Pelvic and Pap done by GYN every 3 years  Musculoskeletal: Normal range of motion.  Lymphadenopathy:    She has no cervical adenopathy.  Neurological: She is alert. She has normal reflexes. No cranial nerve deficit. She exhibits normal muscle tone. Coordination normal.  Skin: Skin is warm and dry. No rash noted. No erythema. No pallor.  Psychiatric: She has a normal mood and affect. Her behavior is normal. Judgment and thought content normal.  Nursing note and vitals reviewed.         Assessment & Plan:   healthy female  Hypertension ago continue current therapy   Venous insufficiency left leg....... No salt diet.... Thigh-high stocking

## 2014-08-23 NOTE — Patient Instructions (Signed)
Cozaar 50 mg,,,,,,,, 1 daily in the morning  Hydrocort thiazide 25 mg,,,,, 1 daily in the morning,,,,,,,,,,, you may double up on Monday Wednesday Friday to help with the swelling of your left leg  Forelegs thigh-high stockings for your left leg,,,,,,,, [facility]   Return in one year sooner if any problem

## 2014-09-14 ENCOUNTER — Other Ambulatory Visit: Payer: Medicare Other

## 2014-09-21 ENCOUNTER — Encounter: Payer: Medicare Other | Admitting: Family Medicine

## 2014-12-05 ENCOUNTER — Other Ambulatory Visit: Payer: Self-pay

## 2015-04-28 ENCOUNTER — Encounter: Payer: Self-pay | Admitting: Internal Medicine

## 2015-05-09 DIAGNOSIS — L814 Other melanin hyperpigmentation: Secondary | ICD-10-CM | POA: Diagnosis not present

## 2015-05-09 DIAGNOSIS — L821 Other seborrheic keratosis: Secondary | ICD-10-CM | POA: Diagnosis not present

## 2015-05-30 ENCOUNTER — Encounter: Payer: Self-pay | Admitting: Family Medicine

## 2015-05-30 ENCOUNTER — Ambulatory Visit (INDEPENDENT_AMBULATORY_CARE_PROVIDER_SITE_OTHER): Payer: Medicare Other | Admitting: Family Medicine

## 2015-05-30 VITALS — BP 134/78 | HR 75 | Temp 97.7°F | Ht 66.5 in | Wt 183.0 lb

## 2015-05-30 DIAGNOSIS — R300 Dysuria: Secondary | ICD-10-CM | POA: Diagnosis not present

## 2015-05-30 DIAGNOSIS — N390 Urinary tract infection, site not specified: Secondary | ICD-10-CM

## 2015-05-30 LAB — POCT URINALYSIS DIPSTICK
Bilirubin, UA: NEGATIVE
Glucose, UA: NEGATIVE
Ketones, UA: NEGATIVE
NITRITE UA: NEGATIVE
PH UA: 7
Protein, UA: NEGATIVE
RBC UA: NEGATIVE
Spec Grav, UA: 1.015
UROBILINOGEN UA: 0.2

## 2015-05-30 MED ORDER — CIPROFLOXACIN HCL 500 MG PO TABS: 500.0000 mg | ORAL_TABLET | Freq: Two times a day (BID) | ORAL | Status: DC

## 2015-05-30 NOTE — Progress Notes (Signed)
   Subjective:    Patient ID: Patricia Lee, female    DOB: 10-31-1945, 69 y.o.   MRN: [id number]  HPI Here for 10 days of urinary urgency and burning. Sometimes the urine has a foul odor. No fever or back pain. She drinks plenty of water.    Review of Systems  Constitutional: Negative.   Gastrointestinal: Negative.   Genitourinary: Positive for dysuria, urgency and frequency. Negative for hematuria, flank pain and pelvic pain.       Objective:   Physical Exam  Constitutional: She appears well-developed and well-nourished.  Cardiovascular: Normal rate, regular rhythm, normal heart sounds and intact distal pulses.   Pulmonary/Chest: Effort normal and breath sounds normal.  Abdominal: Soft. Bowel sounds are normal. She exhibits no distension and no mass. There is no tenderness. There is no rebound and no guarding.          Assessment & Plan:  UTI, treat with Cipro. Culture the sample.

## 2015-05-30 NOTE — Progress Notes (Signed)
Pre visit review using our clinic review tool, if applicable. No additional management support is needed unless otherwise documented below in the visit note. 

## 2015-06-02 LAB — URINE CULTURE: Colony Count: >=100000

## 2015-06-27 ENCOUNTER — Telehealth: Payer: Self-pay | Admitting: Family Medicine

## 2015-06-27 DIAGNOSIS — H539 Unspecified visual disturbance: Secondary | ICD-10-CM

## 2015-06-27 NOTE — Telephone Encounter (Signed)
Pt has an appt on 07-03-15 with dr Rober Minion and needs a referral for eye exam. Dr Julien Girt phone # [phone] and fax [phone]. Pt has [employer] medicare ins

## 2015-06-27 NOTE — Telephone Encounter (Signed)
Referral placed.

## 2015-07-03 DIAGNOSIS — H527 Unspecified disorder of refraction: Secondary | ICD-10-CM | POA: Diagnosis not present

## 2015-07-03 DIAGNOSIS — H43393 Other vitreous opacities, bilateral: Secondary | ICD-10-CM | POA: Diagnosis not present

## 2015-07-04 DIAGNOSIS — Z01419 Encounter for gynecological examination (general) (routine) without abnormal findings: Secondary | ICD-10-CM | POA: Diagnosis not present

## 2015-07-04 DIAGNOSIS — Z130 Encounter for screening for diseases of the blood and blood-forming organs and certain disorders involving the immune mechanism: Secondary | ICD-10-CM | POA: Diagnosis not present

## 2015-07-04 DIAGNOSIS — Z1389 Encounter for screening for other disorder: Secondary | ICD-10-CM | POA: Diagnosis not present

## 2015-07-04 DIAGNOSIS — Z124 Encounter for screening for malignant neoplasm of cervix: Secondary | ICD-10-CM | POA: Diagnosis not present

## 2015-07-04 LAB — HM PAP SMEAR: HM PAP: NEGATIVE

## 2015-07-10 DIAGNOSIS — Z1231 Encounter for screening mammogram for malignant neoplasm of breast: Secondary | ICD-10-CM | POA: Diagnosis not present

## 2015-07-10 LAB — HM MAMMOGRAPHY

## 2015-07-11 ENCOUNTER — Encounter: Payer: Self-pay | Admitting: Family Medicine

## 2015-07-12 ENCOUNTER — Encounter: Payer: Self-pay | Admitting: Family Medicine

## 2015-08-22 ENCOUNTER — Other Ambulatory Visit: Payer: Medicare Other

## 2015-08-29 ENCOUNTER — Encounter: Payer: Medicare Other | Admitting: Family Medicine

## 2015-10-12 ENCOUNTER — Other Ambulatory Visit (INDEPENDENT_AMBULATORY_CARE_PROVIDER_SITE_OTHER): Payer: Medicare Other

## 2015-10-12 DIAGNOSIS — Z0000 Encounter for general adult medical examination without abnormal findings: Secondary | ICD-10-CM | POA: Diagnosis not present

## 2015-10-12 LAB — CBC WITH DIFFERENTIAL/PLATELET
BASOS ABS: 0 10*3/uL (ref 0.0–0.1)
Basophils Relative: 0.7 % (ref 0.0–3.0)
EOS ABS: 0.1 10*3/uL (ref 0.0–0.7)
Eosinophils Relative: 2.5 % (ref 0.0–5.0)
HEMATOCRIT: 37.2 % (ref 36.0–46.0)
Hemoglobin: 12.5 g/dL (ref 12.0–15.0)
LYMPHS PCT: 22.1 % (ref 12.0–46.0)
Lymphs Abs: 1 10*3/uL (ref 0.7–4.0)
MCHC: 33.6 g/dL (ref 30.0–36.0)
MCV: 89.2 fl (ref 78.0–100.0)
MONOS PCT: 9.8 % (ref 3.0–12.0)
Monocytes Absolute: 0.5 10*3/uL (ref 0.1–1.0)
NEUTROS ABS: 3.1 10*3/uL (ref 1.4–7.7)
Neutrophils Relative %: 64.9 % (ref 43.0–77.0)
PLATELETS: 256 10*3/uL (ref 150.0–400.0)
RBC: 4.17 Mil/uL (ref 3.87–5.11)
RDW: 14.4 % (ref 11.5–15.5)
WBC: 4.7 10*3/uL (ref 4.0–10.5)

## 2015-10-12 LAB — POC URINALSYSI DIPSTICK (AUTOMATED)
BILIRUBIN UA: NEGATIVE
GLUCOSE UA: NEGATIVE
Ketones, UA: NEGATIVE
Nitrite, UA: NEGATIVE
Protein, UA: NEGATIVE
RBC UA: NEGATIVE
SPEC GRAV UA: 1.01
UROBILINOGEN UA: 0.2
pH, UA: 6

## 2015-10-12 LAB — LIPID PANEL
CHOL/HDL RATIO: 4
Cholesterol: 247 mg/dL — ABNORMAL HIGH (ref 0–200)
HDL: 60.8 mg/dL (ref >39.00)
LDL Cholesterol: 154 mg/dL — ABNORMAL HIGH (ref 0–99)
NONHDL: 186.56
Triglycerides: 165 mg/dL — ABNORMAL HIGH (ref 0.0–149.0)
VLDL: 33 mg/dL (ref 0.0–40.0)

## 2015-10-12 LAB — BASIC METABOLIC PANEL
BUN: 28 mg/dL — ABNORMAL HIGH (ref 6–23)
CALCIUM: 9.9 mg/dL (ref 8.4–10.5)
CO2: 28 mEq/L (ref 19–32)
CREATININE: 1.12 mg/dL (ref 0.40–1.20)
Chloride: 103 mEq/L (ref 96–112)
GFR: 51.08 mL/min — AB (ref >60.00)
Glucose, Bld: 103 mg/dL — ABNORMAL HIGH (ref 70–99)
Potassium: 4.8 mEq/L (ref 3.5–5.1)
Sodium: 139 mEq/L (ref 135–145)

## 2015-10-12 LAB — HEPATIC FUNCTION PANEL
ALK PHOS: 77 U/L (ref 39–117)
ALT: 10 U/L (ref 0–35)
AST: 15 U/L (ref 0–37)
Albumin: 4.2 g/dL (ref 3.5–5.2)
BILIRUBIN DIRECT: 0.1 mg/dL (ref 0.0–0.3)
TOTAL PROTEIN: 7.2 g/dL (ref 6.0–8.3)
Total Bilirubin: 0.8 mg/dL (ref 0.2–1.2)

## 2015-10-12 LAB — TSH: TSH: 1.59 u[IU]/mL (ref 0.35–4.50)

## 2015-10-17 ENCOUNTER — Ambulatory Visit (INDEPENDENT_AMBULATORY_CARE_PROVIDER_SITE_OTHER): Payer: Medicare Other | Admitting: Family Medicine

## 2015-10-17 ENCOUNTER — Encounter: Payer: Self-pay | Admitting: Family Medicine

## 2015-10-17 VITALS — BP 120/84 | Temp 98.2°F | Ht 66.5 in | Wt 179.0 lb

## 2015-10-17 DIAGNOSIS — Z0000 Encounter for general adult medical examination without abnormal findings: Secondary | ICD-10-CM | POA: Diagnosis not present

## 2015-10-17 DIAGNOSIS — I10 Essential (primary) hypertension: Secondary | ICD-10-CM

## 2015-10-17 DIAGNOSIS — M25551 Pain in right hip: Secondary | ICD-10-CM

## 2015-10-17 DIAGNOSIS — Z85828 Personal history of other malignant neoplasm of skin: Secondary | ICD-10-CM | POA: Diagnosis not present

## 2015-10-17 MED ORDER — HYDROCHLOROTHIAZIDE 25 MG PO TABS: 25.0000 mg | ORAL_TABLET | Freq: Every day | ORAL | Status: DC

## 2015-10-17 MED ORDER — AMLODIPINE BESYLATE 5 MG PO TABS: 5.0000 mg | ORAL_TABLET | Freq: Every day | ORAL | Status: DC

## 2015-10-17 MED ORDER — LOSARTAN POTASSIUM 50 MG PO TABS: 50.0000 mg | ORAL_TABLET | Freq: Every day | ORAL | Status: DC

## 2015-10-17 NOTE — Progress Notes (Signed)
Pre visit review using our clinic review tool, if applicable. No additional management support is needed unless otherwise documented below in the visit note. 

## 2015-10-17 NOTE — Patient Instructions (Signed)
Continue the Motrin 200-400 mg twice daily with food  Continue the hydrochlorothiazide  Stop the Cozaar  Start Norvasc 5 mg.......... one tablet daily in the morning  Check your blood pressure daily in the morning to be sure it stays normal.........Marland Kitchen 140/90 or less  Call [facility] and asked to see Dr. Paralee Cancel for evaluation of your right hip  Return in one year sooner if any problems,,,,,,,,, Tommi Rumps or Almyra Free 2 new adult nurse practitioner to Dr. Martinique

## 2015-10-17 NOTE — Progress Notes (Signed)
Subjective:    Patient ID: Patricia Lee, female    DOB: 1946/02/22, 70 y.o.   MRN: [id number]  HPI Patricia Lee is a 70 year old widowed female nonsmoker,,,,,,,,, her husband Patricia Lee died 3 years ago from a stroke,,,,, who comes in today for general physical examination  She has a history of underlying hypertension on Hydrocort thiazide 25 mg daily and Cozaar 50 mg daily BP 124/84. She's complaining of leg pain. She looked up on the Internet and found Cozaar can cause leg pain. She would like to try another product. We discussed various options. She declined a beta blocker. Because she's heard bad things about it. I recommend we try some Norvasc and continue hydrochlorothiazide. Her potassium levels normal  She gets routine eye care, dental care, mammogram yearly colonoscopy 2012 she's due for repeat next September. Positive family history of colon cancer. Pelvic and Pap December 2016 by GYN normal therefore not repeated  Her BMI is 29.20 which is prediabetes. She has actually lost 4 pounds since December.  She's having difficulty with her right hip. Started about a year ago she can walk a quarter to half a mile and has to stop because of hip pain. Left hip is normal. She takes Motrin 200 mg twice a day but would like an evaluation. I recommended ..mother   Vaccinations up-to-date  Social history she is a widow she lives in [place]. She goes a while 3-4 days a week and does her walking.  Cognitive function normal she walks daily home health safety reviewed no issues identified, no guns in the house, she does have a healthcare power of attorney and living well. She has 2 sons   Review of Systems  Constitutional: Negative.   HENT: Negative.   Eyes: Negative.   Respiratory: Negative.   Cardiovascular: Negative.   Gastrointestinal: Negative.   Endocrine: Negative.   Genitourinary: Negative.   Musculoskeletal: Negative.   Skin: Negative.   Allergic/Immunologic: Negative.   Neurological:  Negative.   Hematological: Negative.   Psychiatric/Behavioral: Negative.        Objective:   Physical Exam  Constitutional: She appears well-developed and well-nourished.  HENT:  Head: Normocephalic and atraumatic.  Right Ear: External ear normal.  Left Ear: External ear normal.  Nose: Nose normal.  Mouth/Throat: Oropharynx is clear and moist.  Eyes: EOM are normal. Pupils are equal, round, and reactive to light.  Neck: Normal range of motion. Neck supple. No JVD present. No tracheal deviation present. No thyromegaly present.  Cardiovascular: Normal rate, regular rhythm, normal heart sounds and intact distal pulses.  Exam reveals no gallop and no friction rub.   No murmur heard. No carotid nor aortic bruits peripheral pulses 2+ and symmetrical  Pulmonary/Chest: Effort normal and breath sounds normal. No stridor. No respiratory distress. She has no wheezes. She has no rales. She exhibits no tenderness.  Abdominal: Soft. Bowel sounds are normal. She exhibits no distension and no mass. There is no tenderness. There is no rebound and no guarding.  Genitourinary:  Bilateral breast exam normal  Pelvic and rectal view GYN therefore not repeated  Musculoskeletal: Normal range of motion.  Lymphadenopathy:    She has no cervical adenopathy.  Neurological: She is alert. She has normal reflexes. No cranial nerve deficit. She exhibits normal muscle tone. Coordination normal.  Skin: Skin is warm and dry. No rash noted. No erythema. No pallor.  Total body skin exam normal except for scars from previous lesions that were removed. She has numerous seborrheic  keratoses  She has numerous cystic type lesions on her face recommend she see a dermatologist and consider chemical peel  Psychiatric: She has a normal mood and affect. Her behavior is normal. Judgment and thought content normal.  Nursing note and vitals reviewed.         Assessment & Plan:  Healthy female  Hypertension at goal  question side effects from the Cozaar............ leg pain........ switch to Norvasc 5 mg daily....... continue the diuretic........ BP check daily......... call if blood pressure does not stay normal 140/90 or less would be my goal for her  Degenerative joint disease right hip,,,,,,, or throat consult with Dr. Farrel Conners  History of skin cancer,,,,,,,,, normal skin exam today,  Slightly overweight..........Marland Kitchen recommend continue weight loss carbohydrate free diet

## 2015-11-22 ENCOUNTER — Encounter: Payer: Self-pay | Admitting: Gastroenterology

## 2015-12-06 DIAGNOSIS — M1611 Unilateral primary osteoarthritis, right hip: Secondary | ICD-10-CM | POA: Diagnosis not present

## 2015-12-06 DIAGNOSIS — M25551 Pain in right hip: Secondary | ICD-10-CM | POA: Diagnosis not present

## 2015-12-18 ENCOUNTER — Encounter: Payer: Self-pay | Admitting: Gastroenterology

## 2016-02-13 ENCOUNTER — Ambulatory Visit (AMBULATORY_SURGERY_CENTER): Payer: Self-pay | Admitting: *Deleted

## 2016-02-13 VITALS — Ht 67.0 in | Wt 194.0 lb

## 2016-02-13 DIAGNOSIS — Z800 Family history of malignant neoplasm of digestive organs: Secondary | ICD-10-CM

## 2016-02-13 MED ORDER — NA SULFATE-K SULFATE-MG SULF 17.5-3.13-1.6 GM/177ML PO SOLN: 1.0000 | Freq: Once | ORAL | 0 refills | Status: AC

## 2016-02-13 NOTE — Progress Notes (Signed)
No egg or soy allergy known to patient  No issues with past sedation with any surgeries  or procedures, no intubation problems  No diet pills per patient No home 02 use per patient  No blood thinners per patient  Pt denies issues with constipation  No A fib or A flutter   

## 2016-02-14 ENCOUNTER — Encounter: Payer: Self-pay | Admitting: Gastroenterology

## 2016-02-26 ENCOUNTER — Encounter: Payer: Self-pay | Admitting: Gastroenterology

## 2016-02-26 ENCOUNTER — Ambulatory Visit (AMBULATORY_SURGERY_CENTER): Payer: Medicare Other | Admitting: Gastroenterology

## 2016-02-26 VITALS — BP 144/59 | HR 66 | Temp 98.4°F | Resp 9 | Ht 67.0 in | Wt 194.0 lb

## 2016-02-26 DIAGNOSIS — Z86010 Personal history of colonic polyps: Secondary | ICD-10-CM

## 2016-02-26 DIAGNOSIS — Z800 Family history of malignant neoplasm of digestive organs: Secondary | ICD-10-CM | POA: Diagnosis present

## 2016-02-26 DIAGNOSIS — Z1211 Encounter for screening for malignant neoplasm of colon: Secondary | ICD-10-CM | POA: Diagnosis not present

## 2016-02-26 MED ORDER — SODIUM CHLORIDE 0.9 % IV SOLN: 500.0000 mL | INTRAVENOUS | Status: DC

## 2016-02-26 NOTE — Progress Notes (Signed)
Report given to PACU RN, vss 

## 2016-02-26 NOTE — Patient Instructions (Signed)
Impression/recommendations:  Diverticulosis (handout given) High Fiber Diet (handout given) Hemorrhoids (handout given)  YOU HAD AN ENDOSCOPIC PROCEDURE TODAY AT THE [facility]:   Refer to the procedure report that was given to you for any specific questions about what was found during the examination.  If the procedure report does not answer your questions, please call your gastroenterologist to clarify.  If you requested that your care partner not be given the details of your procedure findings, then the procedure report has been included in a sealed envelope for you to review at your convenience later.  YOU SHOULD EXPECT: Some feelings of bloating in the abdomen. Passage of more gas than usual.  Walking can help get rid of the air that was put into your GI tract during the procedure and reduce the bloating. If you had a lower endoscopy (such as a colonoscopy or flexible sigmoidoscopy) you may notice spotting of blood in your stool or on the toilet paper. If you underwent a bowel prep for your procedure, you may not have a normal bowel movement for a few days.  Please Note:  You might notice some irritation and congestion in your nose or some drainage.  This is from the oxygen used during your procedure.  There is no need for concern and it should clear up in a day or so.  SYMPTOMS TO REPORT IMMEDIATELY:   Following lower endoscopy (colonoscopy or flexible sigmoidoscopy):  Excessive amounts of blood in the stool  Significant tenderness or worsening of abdominal pains  Swelling of the abdomen that is new, acute  Fever of 100F or higher  For urgent or emergent issues, a gastroenterologist can be reached at any hour by calling [phone].   DIET:  We do recommend a small meal at first, but then you may proceed to your regular diet.  Drink plenty of fluids but you should avoid alcoholic beverages for 24 hours.  ACTIVITY:  You should plan to take it easy for the rest of  today and you should NOT DRIVE or use heavy machinery until tomorrow (because of the sedation medicines used during the test).    FOLLOW UP: Our staff will call the number listed on your records the next business day following your procedure to check on you and address any questions or concerns that you may have regarding the information given to you following your procedure. If we do not reach you, we will leave a message.  However, if you are feeling well and you are not experiencing any problems, there is no need to return our call.  We will assume that you have returned to your regular daily activities without incident.  If any biopsies were taken you will be contacted by phone or by letter within the next 1-3 weeks.  Please call us at [phone] if you have not heard about the biopsies in 3 weeks.    SIGNATURES/CONFIDENTIALITY: You and/or your care partner have signed paperwork which will be entered into your electronic medical record.  These signatures attest to the fact that that the information above on your After Visit Summary has been reviewed and is understood.  Full responsibility of the confidentiality of this discharge information lies with you and/or your care-partner. 

## 2016-02-26 NOTE — Op Note (Signed)
[facility] Patient Name: Patricia Lee Procedure Date: 02/26/2016 9:31 AM MRN: [id number] Endoscopist: Mauri Pole , MD Age: 70 Referring MD:  Date of Birth: [birth year] Gender: Female Account #: [id number] Procedure:                Colonoscopy Indications:              High risk colon cancer surveillance: Personal                            history of colonic polyps, Family history of colon                            cancer in a first-degree relative Medicines:                Monitored Anesthesia Care Procedure:                Pre-Anesthesia Assessment:                           - Prior to the procedure, a History and Physical                            was performed, and patient medications and                            allergies were reviewed. The patient's tolerance of                            previous anesthesia was also reviewed. The risks                            and benefits of the procedure and the sedation                            options and risks were discussed with the patient.                            All questions were answered, and informed consent                            was obtained. Prior Anticoagulants: The patient has                            taken no previous anticoagulant or antiplatelet                            agents. ASA Grade Assessment: II - A patient with                            mild systemic disease. After reviewing the risks                            and benefits, the patient was deemed in  satisfactory condition to undergo the procedure.                           After obtaining informed consent, the colonoscope                            was passed under direct vision. Throughout the                            procedure, the patient's blood pressure, pulse, and                            oxygen saturations were monitored continuously. The                            Model CF-HQ190L  [id number]) scope was introduced                            through the anus and advanced to the the terminal                            ileum, with identification of the appendiceal                            orifice and IC valve. The Model PCF-H190DL                            [id number]) scope was introduced through the and                            advanced to the. The colonoscopy was technically                            difficult and complex due to restricted mobility of                            the colon. Successful completion of the procedure                            was aided by withdrawing the scope and replacing                            with the pediatric colonoscope. The patient                            tolerated the procedure well. The quality of the                            bowel preparation was good. The ileocecal valve,                            appendiceal orifice, and rectum were photographed. Scope In: 9:42:56 AM Scope Out: 10:02:20 AM Scope Withdrawal Time: 0 hours 6 minutes 54 seconds  Total Procedure Duration: 0 hours 19 minutes 24 seconds  Findings:                 The perianal and digital rectal examinations were                            normal.                           Multiple small and large-mouthed diverticula were                            found in the sigmoid colon. There was narrowing of                            the colon in association with the diverticular                            opening. There was evidence of diverticular spasm.                            Peri-diverticular erythema was seen.                           Non-bleeding internal hemorrhoids were found during                            retroflexion. The hemorrhoids were large.                           The exam was otherwise without abnormality. Complications:            No immediate complications. Estimated Blood Loss:     Estimated blood loss: none. Impression:                - Severe diverticulosis in the sigmoid colon. There                            was narrowing of the colon in association with the                            diverticular opening. There was evidence of                            diverticular spasm. Peri-diverticular erythema was                            seen.                           - Non-bleeding internal hemorrhoids.                           - The examination was otherwise normal.                           - No specimens collected. Recommendation:           -  Patient has a contact number available for                            emergencies. The signs and symptoms of potential                            delayed complications were discussed with the                            patient. Return to normal activities tomorrow.                            Written discharge instructions were provided to the                            patient.                           - Resume previous diet.                           - Continue present medications.                           - Repeat colonoscopy in 5 years for surveillance.                           - Return to GI clinic PRN. Mauri Pole, MD 02/26/2016 10:13:27 AM This report has been signed electronically.

## 2016-02-27 ENCOUNTER — Telehealth: Payer: Self-pay

## 2016-02-27 NOTE — Telephone Encounter (Signed)
  Follow up Call-  Call back number 02/26/2016  Post procedure Call Back phone  # [phone]  Permission to leave phone message Yes  Some recent data might be hidden     Patient questions:  Do you have a fever, pain , or abdominal swelling? No. Pain Score  0 *  Have you tolerated food without any problems? Yes.    Have you been able to return to your normal activities? Yes.    Do you have any questions about your discharge instructions: Diet   Yes.   Medications  No. Follow up visit  No.  Do you have questions or concerns about your Care? No.  Actions: * If pain score is 4 or above: No action needed, pain <4.

## 2016-02-28 ENCOUNTER — Ambulatory Visit (INDEPENDENT_AMBULATORY_CARE_PROVIDER_SITE_OTHER): Payer: Medicare Other | Admitting: Family Medicine

## 2016-02-28 ENCOUNTER — Encounter: Payer: Self-pay | Admitting: Family Medicine

## 2016-02-28 VITALS — BP 172/82 | HR 67 | Temp 98.2°F | Wt 178.8 lb

## 2016-02-28 DIAGNOSIS — I4949 Other premature depolarization: Secondary | ICD-10-CM

## 2016-02-28 DIAGNOSIS — Z23 Encounter for immunization: Secondary | ICD-10-CM

## 2016-02-28 DIAGNOSIS — Z85828 Personal history of other malignant neoplasm of skin: Secondary | ICD-10-CM

## 2016-02-28 DIAGNOSIS — I10 Essential (primary) hypertension: Secondary | ICD-10-CM | POA: Diagnosis not present

## 2016-02-28 NOTE — Progress Notes (Signed)
Pre visit review using our clinic review tool, if applicable. No additional management support is needed unless otherwise documented below in the visit note. 

## 2016-02-28 NOTE — Progress Notes (Signed)
Patricia Lee is a 70 year old widowed female nonsmoker who comes in today for preop physical examination total right hip replacement  I saw her in May 2017 for physical examination. That time she is having severe difficulty walking. I referred her to Dr. Tommy Medal for evaluation. There are evaluation solely total degeneration of the right hip. She is scheduled for October 31 for right hip THA. She's here today for preoperative evaluation  Physical exam in May was normal except for hypertension. She's on Norvasc 2.5 mg daily. She takes her blood pressure around 10 AM after she takes her medicine at 7:30 AM. However sometimes her blood pressure is elevated. When she came in here is 172/82. Repeat by me was 160/80.  Review of systems otherwise negative  She declines a flu shot she said she had a reaction to 12 years ago manifested by severe back pain. Tetanus is due today  Physical examination vital signs stable she's afebrile except for slight elevation of blood pressure.  Healthy female  #1 hypertension.......... increase Norvasc to 2.5 mg twice a day BP check every morning for 2 weeks fax me the data in 2 weeks. We want to be sure blood pressures normal prior to surgery  #2 degenerative joint disease right hip/////////// okay for surgery October 31 by Dr. Tommy Medal

## 2016-02-28 NOTE — Patient Instructions (Signed)
Norvasc 5 mg................ one half tablet with breakfast,,,,,,,,,,, one half tablet with your evening female  Check your blood pressure daily in the morning  Blood pressure goal 135/85 or less............Marland Kitchen email me your data in 2 weeks so we can be sure your blood pressures normal prior to surgery  Because you're having a surgical procedure and your do for a tetanus booster we will give you tetanus booster today

## 2016-03-12 ENCOUNTER — Encounter: Payer: Self-pay | Admitting: Family Medicine

## 2016-03-15 NOTE — Telephone Encounter (Signed)
Pts Bp was very high and she state that Dr Sherren Mocha had changed her Rx to 1/2 in the morning and 1/2 in the afternoon and her target Bp Dr. Sherren Mocha had state he would like for it to be is 135 before her surgery.  Avg 140/72 after taking the medication for 2 weeks.  Surgery 04/09/16 for hip replacement.

## 2016-03-20 NOTE — H&P (Signed)
TOTAL HIP ADMISSION H&P  Patient is admitted for right total hip arthroplasty.  Subjective:  Chief Complaint: Right hip primary OA / pain  HPI: Patricia Lee, 70 y.o. female, has a history of pain and functional disability in the right hip(s) due to arthritis and patient has failed non-surgical conservative treatments for greater than 12 weeks to include NSAID's and/or analgesics and activity modification.  Onset of symptoms was gradual starting ~2 years ago with gradually worsening course since that time.The patient noted no past surgery on the right hip(s).  Patient currently rates pain in the right hip at 7 out of 10 with activity. Patient has night pain, worsening of pain with activity and weight bearing, trendelenberg gait, pain that interfers with activities of daily living and pain with passive range of motion. Patient has evidence of periarticular osteophytes and joint space narrowing by imaging studies. This condition presents safety issues increasing the risk of falls.  There is no current active infection.  Risks, benefits and expectations were discussed with the patient.  Risks including but not limited to the risk of anesthesia, blood clots, nerve damage, blood vessel damage, failure of the prosthesis, infection and up to and including death.  Patient understand the risks, benefits and expectations and wishes to proceed with surgery.    PCP: Joycelyn Man, MD  D/C Plans:      Home with HHPT  Post-op Meds:       No Rx given  Tranexamic Acid:      To be given - IV   Decadron:      Is to be given  FYI:     ASA  Norco    Patient Active Problem List   Diagnosis Date Noted  . Routine general medical examination at a health care facility 10/17/2015  . History of skin cancer 07/28/2012  . HERPES ZOSTER 11/27/2008  . PREMATURE VENTRICULAR CONTRACTIONS 11/17/2008  . Essential hypertension 09/10/2007   Past Medical History:  Diagnosis Date  . Allergy    spring seasonal  .  Arthritis    hip   . Hypertension     Past Surgical History:  Procedure Laterality Date  . COLONOSCOPY    . POLYPECTOMY      No prescriptions prior to admission.   Allergies  Allergen Reactions  . Codeine Other (See Comments)    Tachycardia and insomnia  . Erythromycin Other (See Comments)    Tachycardia and insomnia  . Influenza Vaccines Other (See Comments)    Nerve reaction similar to shingles- all nerves in back on fire   . Prednisone Other (See Comments)    Tachycardia and insomnia  . Tetracyclines & Related Other (See Comments)    Tachycardia/insomnia    Social History  Substance Use Topics  . Smoking status: Former Research scientist (life sciences)  . Smokeless tobacco: Never Used  . Alcohol use 3.6 oz/week    6 Glasses of wine per week    Family History  Problem Relation Age of Onset  . Colon cancer Father   . Stomach cancer Mother   . Colon polyps Neg Hx   . Rectal cancer Neg Hx      Review of Systems  Constitutional: Negative.   HENT: Negative.   Eyes: Negative.   Respiratory: Negative.   Cardiovascular: Negative.   Gastrointestinal: Negative.   Genitourinary: Negative.   Musculoskeletal: Positive for joint pain.  Skin: Negative.   Neurological: Negative.   Endo/Heme/Allergies: Positive for environmental allergies.  Psychiatric/Behavioral: Negative.  Objective:  Physical Exam  Constitutional: She is oriented to person, place, and time. She appears well-developed.  HENT:  Head: Normocephalic.  Eyes: Pupils are equal, round, and reactive to light.  Neck: Neck supple. No JVD present. No tracheal deviation present. No thyromegaly present.  Cardiovascular: Normal rate, regular rhythm, normal heart sounds and intact distal pulses.   Respiratory: Effort normal and breath sounds normal. No respiratory distress. She has no wheezes.  GI: Soft. There is no tenderness. There is no guarding.  Musculoskeletal:       Right hip: She exhibits decreased range of motion, decreased  strength, tenderness and bony tenderness. She exhibits no swelling, no deformity and no laceration.  Lymphadenopathy:    She has no cervical adenopathy.  Neurological: She is alert and oriented to person, place, and time.  Skin: Skin is warm and dry.  Psychiatric: She has a normal mood and affect.      Labs:  Estimated body mass index is 28 kg/m as calculated from the following:   Height as of 02/26/16: 5\' 7"  (1.702 m).   Weight as of 02/28/16: 81.1 kg (178 lb 12.8 oz).   Imaging Review Plain radiographs demonstrate severe degenerative joint disease of the right hip(s). The bone quality appears to be good for age and reported activity level.  Assessment/Plan:  End stage arthritis, right hip(s)  The patient history, physical examination, clinical judgement of the provider and imaging studies are consistent with end stage degenerative joint disease of the right hip(s) and total hip arthroplasty is deemed medically necessary. The treatment options including medical management, injection therapy, arthroscopy and arthroplasty were discussed at length. The risks and benefits of total hip arthroplasty were presented and reviewed. The risks due to aseptic loosening, infection, stiffness, dislocation/subluxation,  thromboembolic complications and other imponderables were discussed.  The patient acknowledged the explanation, agreed to proceed with the plan and consent was signed. Patient is being admitted for inpatient treatment for surgery, pain control, PT, OT, prophylactic antibiotics, VTE prophylaxis, progressive ambulation and ADL's and discharge planning.The patient is planning to be discharged home with home health services.     West Pugh Akiah Bauch   PA-C  03/20/2016, 12:16 PM

## 2016-04-03 ENCOUNTER — Encounter (HOSPITAL_COMMUNITY): Payer: Self-pay

## 2016-04-03 ENCOUNTER — Encounter (HOSPITAL_COMMUNITY)
Admission: RE | Admit: 2016-04-03 | Discharge: 2016-04-03 | Disposition: A | Discharge status: Home / Self Care | Payer: Medicare Other | Source: Ambulatory Visit | Attending: Orthopedic Surgery | Admitting: Orthopedic Surgery

## 2016-04-03 DIAGNOSIS — Z01812 Encounter for preprocedural laboratory examination: Secondary | ICD-10-CM | POA: Insufficient documentation

## 2016-04-03 LAB — BASIC METABOLIC PANEL
Anion gap: 9 (ref 5–15)
BUN: 23 mg/dL — AB (ref 6–20)
CALCIUM: 9.6 mg/dL (ref 8.9–10.3)
CO2: 26 mmol/L (ref 22–32)
Chloride: 103 mmol/L (ref 101–111)
Creatinine, Ser: 1.06 mg/dL — ABNORMAL HIGH (ref 0.44–1.00)
GFR calc Af Amer: >60 mL/min (ref >60)
GFR, EST NON AFRICAN AMERICAN: 52 mL/min — AB (ref >60)
GLUCOSE: 95 mg/dL (ref 65–99)
POTASSIUM: 4.8 mmol/L (ref 3.5–5.1)
Sodium: 138 mmol/L (ref 135–145)

## 2016-04-03 LAB — CBC
HEMATOCRIT: 37.4 % (ref 36.0–46.0)
HEMOGLOBIN: 12.5 g/dL (ref 12.0–15.0)
MCH: 29.3 pg (ref 26.0–34.0)
MCHC: 33.4 g/dL (ref 30.0–36.0)
MCV: 87.6 fL (ref 78.0–100.0)
Platelets: 271 10*3/uL (ref 150–400)
RBC: 4.27 MIL/uL (ref 3.87–5.11)
RDW: 14.5 % (ref 11.5–15.5)
WBC: 7.5 10*3/uL (ref 4.0–10.5)

## 2016-04-03 LAB — SURGICAL PCR SCREEN
MRSA, PCR: NEGATIVE
STAPHYLOCOCCUS AUREUS: NEGATIVE

## 2016-04-03 LAB — ABO/RH: ABO/RH(D): O NEG

## 2016-04-03 NOTE — Patient Instructions (Addendum)
Patricia Lee  04/03/2016   Your procedure is scheduled on: 04/09/16  Report to [facility] Main  Entrance take [facility]  elevators to 3rd floor to  [place] at  0500 AM.  Call this number if you have problems the morning of surgery [phone]   Remember: ONLY 1 PERSON MAY GO WITH YOU TO SHORT STAY TO GET  READY MORNING OF YOUR SURGERY.  Do not eat food or drink liquids :After Midnight.     Take these medicines the morning of surgery with A SIP OF WATER: Amlodipine                                You may not have any metal on your body including hair pins and              piercings  Do not wear jewelry, make-up, lotions, powders or perfumes, deodorant             Do not wear nail polish.  Do not shave  48 hours prior to surgery.              Men may shave face and neck.   Do not bring valuables to the hospital. [place].  Contacts, dentures or bridgework may not be worn into surgery.  Leave suitcase in the car. After surgery it may be brought to your room.                Please read over the following fact sheets you were given: _____________________________________________________________________             [facility] - Preparing for Surgery Before surgery, you can play an important role.  Because skin is not sterile, your skin needs to be as free of germs as possible.  You can reduce the number of germs on your skin by washing with CHG (chlorahexidine gluconate) soap before surgery.  CHG is an antiseptic cleaner which kills germs and bonds with the skin to continue killing germs even after washing. Please DO NOT use if you have an allergy to CHG or antibacterial soaps.  If your skin becomes reddened/irritated stop using the CHG and inform your nurse when you arrive at Short Stay. Do not shave (including legs and underarms) for at least 48 hours prior to the first CHG shower.  You may shave your  face/neck. Please follow these instructions carefully:  1.  Shower with CHG Soap the night before surgery and the  morning of Surgery.  2.  If you choose to wash your hair, wash your hair first as usual with your  normal  shampoo.  3.  After you shampoo, rinse your hair and body thoroughly to remove the  shampoo.                           4.  Use CHG as you would any other liquid soap.  You can apply chg directly  to the skin and wash                       Gently with a scrungie or clean washcloth.  5.  Apply the CHG Soap to  your body ONLY FROM THE NECK DOWN.   Do not use on face/ open                           Wound or open sores. Avoid contact with eyes, ears mouth and genitals (private parts).                       Wash face,  Genitals (private parts) with your normal soap.             6.  Wash thoroughly, paying special attention to the area where your surgery  will be performed.  7.  Thoroughly rinse your body with warm water from the neck down.  8.  DO NOT shower/wash with your normal soap after using and rinsing off  the CHG Soap.                9.  Pat yourself dry with a clean towel.            10.  Wear clean pajamas.            11.  Place clean sheets on your bed the night of your first shower and do not  sleep with pets. Day of Surgery : Do not apply any lotions/deodorants the morning of surgery.  Please wear clean clothes to the hospital/surgery center.  FAILURE TO FOLLOW THESE INSTRUCTIONS MAY RESULT IN THE CANCELLATION OF YOUR SURGERY PATIENT SIGNATURE_________________________________  NURSE SIGNATURE__________________________________  ________________________________________________________________________   Adam Phenix  An incentive spirometer is a tool that can help keep your lungs clear and active. This tool measures how well you are filling your lungs with each breath. Taking long deep breaths may help reverse or decrease the chance of developing breathing  (pulmonary) problems (especially infection) following:  A long period of time when you are unable to move or be active. BEFORE THE PROCEDURE   If the spirometer includes an indicator to show your best effort, your nurse or respiratory therapist will set it to a desired goal.  If possible, sit up straight or lean slightly forward. Try not to slouch.  Hold the incentive spirometer in an upright position. INSTRUCTIONS FOR USE  1. Sit on the edge of your bed if possible, or sit up as far as you can in bed or on a chair. 2. Hold the incentive spirometer in an upright position. 3. Breathe out normally. 4. Place the mouthpiece in your mouth and seal your lips tightly around it. 5. Breathe in slowly and as deeply as possible, raising the piston or the ball toward the top of the column. 6. Hold your breath for 3-5 seconds or for as long as possible. Allow the piston or ball to fall to the bottom of the column. 7. Remove the mouthpiece from your mouth and breathe out normally. 8. Rest for a few seconds and repeat Steps 1 through 7 at least 10 times every 1-2 hours when you are awake. Take your time and take a few normal breaths between deep breaths. 9. The spirometer may include an indicator to show your best effort. Use the indicator as a goal to work toward during each repetition. 10. After each set of 10 deep breaths, practice coughing to be sure your lungs are clear. If you have an incision (the cut made at the time of surgery), support your incision when coughing by placing a pillow or rolled up towels firmly against  it. Once you are able to get out of bed, walk around indoors and cough well. You may stop using the incentive spirometer when instructed by your caregiver.  RISKS AND COMPLICATIONS  Take your time so you do not get dizzy or light-headed.  If you are in pain, you may need to take or ask for pain medication before doing incentive spirometry. It is harder to take a deep breath if you  are having pain. AFTER USE  Rest and breathe slowly and easily.  It can be helpful to keep track of a log of your progress. Your caregiver can provide you with a simple table to help with this. If you are using the spirometer at home, follow these instructions: [place] IF:   You are having difficultly using the spirometer.  You have trouble using the spirometer as often as instructed.  Your pain medication is not giving enough relief while using the spirometer.  You develop fever of 100.5 F (38.1 C) or higher. SEEK IMMEDIATE MEDICAL CARE IF:   You cough up bloody sputum that had not been present before.  You develop fever of 102 F (38.9 C) or greater.  You develop worsening pain at or near the incision site. MAKE SURE YOU:   Understand these instructions.  Will watch your condition.  Will get help right away if you are not doing well or get worse. Document Released: 10/07/2006 Document Revised: 08/19/2011 Document Reviewed: 12/08/2006 ExitCare Patient Information 2014 ExitCare, [place].   ________________________________________________________________________  WHAT IS A BLOOD TRANSFUSION? Blood Transfusion Information  A transfusion is the replacement of blood or some of its parts. Blood is made up of multiple cells which provide different functions.  Red blood cells carry oxygen and are used for blood loss replacement.  White blood cells fight against infection.  Platelets control bleeding.  Plasma helps clot blood.  Other blood products are available for specialized needs, such as hemophilia or other clotting disorders. BEFORE THE TRANSFUSION  Who gives blood for transfusions?   Healthy volunteers who are fully evaluated to make sure their blood is safe. This is blood bank blood. Transfusion therapy is the safest it has ever been in the practice of medicine. Before blood is taken from a donor, a complete history is taken to make sure that person has  no history of diseases nor engages in risky social behavior (examples are intravenous drug use or sexual activity with multiple partners). The donor's travel history is screened to minimize risk of transmitting infections, such as malaria. The donated blood is tested for signs of infectious diseases, such as HIV and hepatitis. The blood is then tested to be sure it is compatible with you in order to minimize the chance of a transfusion reaction. If you or a relative donates blood, this is often done in anticipation of surgery and is not appropriate for emergency situations. It takes many days to process the donated blood. RISKS AND COMPLICATIONS Although transfusion therapy is very safe and saves many lives, the main dangers of transfusion include:   Getting an infectious disease.  Developing a transfusion reaction. This is an allergic reaction to something in the blood you were given. Every precaution is taken to prevent this. The decision to have a blood transfusion has been considered carefully by your caregiver before blood is given. Blood is not given unless the benefits outweigh the risks. AFTER THE TRANSFUSION  Right after receiving a blood transfusion, you will usually feel much better and more  energetic. This is especially true if your red blood cells have gotten low (anemic). The transfusion raises the level of the red blood cells which carry oxygen, and this usually causes an energy increase.  The nurse administering the transfusion will monitor you carefully for complications. HOME CARE INSTRUCTIONS  No special instructions are needed after a transfusion. You may find your energy is better. Speak with your caregiver about any limitations on activity for underlying diseases you may have. SEEK MEDICAL CARE IF:   Your condition is not improving after your transfusion.  You develop redness or irritation at the intravenous (IV) site. SEEK IMMEDIATE MEDICAL CARE IF:  Any of the following  symptoms occur over the next 12 hours:  Shaking chills.  You have a temperature by mouth above 102 F (38.9 C), not controlled by medicine.  Chest, back, or muscle pain.  People around you feel you are not acting correctly or are confused.  Shortness of breath or difficulty breathing.  Dizziness and fainting.  You get a rash or develop hives.  You have a decrease in urine output.  Your urine turns a dark color or changes to pink, red, or brown. Any of the following symptoms occur over the next 10 days:  You have a temperature by mouth above 102 F (38.9 C), not controlled by medicine.  Shortness of breath.  Weakness after normal activity.  The white part of the eye turns yellow (jaundice).  You have a decrease in the amount of urine or are urinating less often.  Your urine turns a dark color or changes to pink, red, or brown. Document Released: 05/24/2000 Document Revised: 08/19/2011 Document Reviewed: 01/11/2008 [facility] Patient Information 2014 [place], [place].  _______________________________________________________________________

## 2016-04-09 ENCOUNTER — Inpatient Hospital Stay (HOSPITAL_COMMUNITY)
Admission: RE | Admit: 2016-04-09 | Discharge: 2016-04-10 | DRG: 470 | Disposition: A | Discharge status: Home Health | Payer: Medicare Other | Source: Ambulatory Visit | Attending: Orthopedic Surgery | Admitting: Orthopedic Surgery

## 2016-04-09 ENCOUNTER — Inpatient Hospital Stay (HOSPITAL_COMMUNITY): Payer: Medicare Other | Admitting: Anesthesiology

## 2016-04-09 ENCOUNTER — Encounter (HOSPITAL_COMMUNITY): Payer: Self-pay | Admitting: Anesthesiology

## 2016-04-09 ENCOUNTER — Encounter (HOSPITAL_COMMUNITY)
Admission: RE | Disposition: A | Discharge status: Home Health | Payer: Self-pay | Source: Ambulatory Visit | Attending: Orthopedic Surgery

## 2016-04-09 ENCOUNTER — Inpatient Hospital Stay (HOSPITAL_COMMUNITY): Payer: Medicare Other

## 2016-04-09 DIAGNOSIS — E663 Overweight: Secondary | ICD-10-CM | POA: Diagnosis present

## 2016-04-09 DIAGNOSIS — Z87891 Personal history of nicotine dependence: Secondary | ICD-10-CM

## 2016-04-09 DIAGNOSIS — M1611 Unilateral primary osteoarthritis, right hip: Principal | ICD-10-CM | POA: Diagnosis present

## 2016-04-09 DIAGNOSIS — M25561 Pain in right knee: Secondary | ICD-10-CM | POA: Diagnosis present

## 2016-04-09 DIAGNOSIS — Z96649 Presence of unspecified artificial hip joint: Secondary | ICD-10-CM

## 2016-04-09 DIAGNOSIS — Z471 Aftercare following joint replacement surgery: Secondary | ICD-10-CM | POA: Diagnosis not present

## 2016-04-09 DIAGNOSIS — I10 Essential (primary) hypertension: Secondary | ICD-10-CM | POA: Diagnosis present

## 2016-04-09 DIAGNOSIS — Z85828 Personal history of other malignant neoplasm of skin: Secondary | ICD-10-CM | POA: Diagnosis not present

## 2016-04-09 DIAGNOSIS — Z96641 Presence of right artificial hip joint: Secondary | ICD-10-CM | POA: Diagnosis not present

## 2016-04-09 HISTORY — PX: TOTAL HIP ARTHROPLASTY: SHX124

## 2016-04-09 LAB — TYPE AND SCREEN
ABO/RH(D): O NEG
ANTIBODY SCREEN: NEGATIVE

## 2016-04-09 SURGERY — ARTHROPLASTY, HIP, TOTAL, ANTERIOR APPROACH
Anesthesia: Spinal | Site: Hip | Laterality: Right

## 2016-04-09 MED ORDER — PROMETHAZINE HCL 25 MG/ML IJ SOLN: 6.2500 mg | INTRAMUSCULAR | Status: DC | PRN

## 2016-04-09 MED ORDER — HYDROCODONE-ACETAMINOPHEN 7.5-325 MG PO TABS
1.0000 | ORAL_TABLET | ORAL | Status: DC
  Administered 2016-04-09: 2 via ORAL
  Administered 2016-04-09 (×3): 1 via ORAL
  Administered 2016-04-10 (×4): 2 via ORAL
  Filled 2016-04-09 (×3): qty 2
  Filled 2016-04-09: qty 1
  Filled 2016-04-09: qty 2
  Filled 2016-04-09 (×2): qty 1
  Filled 2016-04-09: qty 2

## 2016-04-09 MED ORDER — ONDANSETRON HCL 4 MG PO TABS: 4.0000 mg | ORAL_TABLET | Freq: Four times a day (QID) | ORAL | Status: DC | PRN

## 2016-04-09 MED ORDER — PHENYLEPHRINE HCL 10 MG/ML IJ SOLN
INTRAMUSCULAR | Status: DC | PRN
  Administered 2016-04-09 (×4): 80 ug via INTRAVENOUS

## 2016-04-09 MED ORDER — DEXAMETHASONE SODIUM PHOSPHATE 10 MG/ML IJ SOLN
INTRAMUSCULAR | Status: AC
  Filled 2016-04-09: qty 1

## 2016-04-09 MED ORDER — HYDROMORPHONE HCL 1 MG/ML IJ SOLN
0.2500 mg | INTRAMUSCULAR | Status: DC | PRN
  Administered 2016-04-09 (×2): 0.5 mg via INTRAVENOUS

## 2016-04-09 MED ORDER — HYDROMORPHONE HCL 1 MG/ML IJ SOLN: 0.5000 mg | INTRAMUSCULAR | Status: DC | PRN

## 2016-04-09 MED ORDER — AMLODIPINE BESYLATE 5 MG PO TABS
2.5000 mg | ORAL_TABLET | Freq: Two times a day (BID) | ORAL | Status: DC
  Administered 2016-04-09: 2.5 mg via ORAL
  Filled 2016-04-09: qty 1

## 2016-04-09 MED ORDER — PROPOFOL 500 MG/50ML IV EMUL
INTRAVENOUS | Status: DC | PRN
  Administered 2016-04-09: 75 ug/kg/min via INTRAVENOUS

## 2016-04-09 MED ORDER — TRANEXAMIC ACID 1000 MG/10ML IV SOLN
1000.0000 mg | INTRAVENOUS | Status: AC
  Administered 2016-04-09: 1000 mg via INTRAVENOUS
  Filled 2016-04-09: qty 1100

## 2016-04-09 MED ORDER — PHENOL 1.4 % MT LIQD
1.0000 | OROMUCOSAL | Status: DC | PRN
  Filled 2016-04-09: qty 177

## 2016-04-09 MED ORDER — CELECOXIB 200 MG PO CAPS
200.0000 mg | ORAL_CAPSULE | Freq: Two times a day (BID) | ORAL | Status: DC
  Administered 2016-04-09: 200 mg via ORAL
  Filled 2016-04-09: qty 1

## 2016-04-09 MED ORDER — HYDROMORPHONE HCL 1 MG/ML IJ SOLN
INTRAMUSCULAR | Status: AC
  Filled 2016-04-09: qty 1

## 2016-04-09 MED ORDER — SODIUM CHLORIDE 0.9 % IR SOLN
Status: DC | PRN
  Administered 2016-04-09: 1000 mL

## 2016-04-09 MED ORDER — DOCUSATE SODIUM 100 MG PO CAPS
100.0000 mg | ORAL_CAPSULE | Freq: Two times a day (BID) | ORAL | Status: DC
  Administered 2016-04-09 – 2016-04-10 (×2): 100 mg via ORAL
  Filled 2016-04-09 (×2): qty 1

## 2016-04-09 MED ORDER — PROPOFOL 10 MG/ML IV BOLUS
INTRAVENOUS | Status: AC
  Filled 2016-04-09: qty 60

## 2016-04-09 MED ORDER — FENTANYL CITRATE (PF) 100 MCG/2ML IJ SOLN
INTRAMUSCULAR | Status: AC
  Filled 2016-04-09: qty 2

## 2016-04-09 MED ORDER — CEFAZOLIN SODIUM-DEXTROSE 2-4 GM/100ML-% IV SOLN
INTRAVENOUS | Status: AC
  Filled 2016-04-09: qty 100

## 2016-04-09 MED ORDER — MAGNESIUM CITRATE PO SOLN: 1.0000 | Freq: Once | ORAL | Status: DC | PRN

## 2016-04-09 MED ORDER — BUPIVACAINE HCL (PF) 0.5 % IJ SOLN
INTRAMUSCULAR | Status: AC
  Filled 2016-04-09: qty 30

## 2016-04-09 MED ORDER — CHLORHEXIDINE GLUCONATE 4 % EX LIQD: 60.0000 mL | Freq: Once | CUTANEOUS | Status: DC

## 2016-04-09 MED ORDER — LACTATED RINGERS IV SOLN
INTRAVENOUS | Status: DC | PRN
  Administered 2016-04-09 (×3): via INTRAVENOUS

## 2016-04-09 MED ORDER — EPHEDRINE SULFATE 50 MG/ML IJ SOLN
INTRAMUSCULAR | Status: DC | PRN
  Administered 2016-04-09: 5 mg via INTRAVENOUS
  Administered 2016-04-09: 10 mg via INTRAVENOUS
  Administered 2016-04-09 (×3): 5 mg via INTRAVENOUS

## 2016-04-09 MED ORDER — PROPOFOL 10 MG/ML IV BOLUS
INTRAVENOUS | Status: DC | PRN
  Administered 2016-04-09: 30 mg via INTRAVENOUS

## 2016-04-09 MED ORDER — BUPIVACAINE IN DEXTROSE 0.75-8.25 % IT SOLN
INTRATHECAL | Status: DC | PRN
  Administered 2016-04-09: 2 mL via INTRATHECAL

## 2016-04-09 MED ORDER — FENTANYL CITRATE (PF) 100 MCG/2ML IJ SOLN
INTRAMUSCULAR | Status: DC | PRN
  Administered 2016-04-09: 25 ug via INTRAVENOUS
  Administered 2016-04-09: 50 ug via INTRAVENOUS
  Administered 2016-04-09: 25 ug via INTRAVENOUS

## 2016-04-09 MED ORDER — CEFAZOLIN SODIUM-DEXTROSE 2-4 GM/100ML-% IV SOLN
2.0000 g | INTRAVENOUS | Status: AC
  Administered 2016-04-09: 2 g via INTRAVENOUS

## 2016-04-09 MED ORDER — EPHEDRINE 5 MG/ML INJ
INTRAVENOUS | Status: AC
  Filled 2016-04-09: qty 10

## 2016-04-09 MED ORDER — CEFAZOLIN SODIUM-DEXTROSE 2-4 GM/100ML-% IV SOLN
2.0000 g | Freq: Four times a day (QID) | INTRAVENOUS | Status: AC
  Administered 2016-04-09 (×2): 2 g via INTRAVENOUS
  Filled 2016-04-09 (×2): qty 100

## 2016-04-09 MED ORDER — POLYETHYLENE GLYCOL 3350 17 G PO PACK
17.0000 g | PACK | Freq: Two times a day (BID) | ORAL | Status: DC
  Administered 2016-04-09 – 2016-04-10 (×2): 17 g via ORAL
  Filled 2016-04-09 (×2): qty 1

## 2016-04-09 MED ORDER — ONDANSETRON HCL 4 MG/2ML IJ SOLN: 4.0000 mg | Freq: Four times a day (QID) | INTRAMUSCULAR | Status: DC | PRN

## 2016-04-09 MED ORDER — METHOCARBAMOL 500 MG PO TABS
500.0000 mg | ORAL_TABLET | Freq: Four times a day (QID) | ORAL | Status: DC | PRN
  Administered 2016-04-09: 500 mg via ORAL
  Filled 2016-04-09: qty 1

## 2016-04-09 MED ORDER — ONDANSETRON HCL 4 MG/2ML IJ SOLN
INTRAMUSCULAR | Status: AC
  Filled 2016-04-09: qty 2

## 2016-04-09 MED ORDER — MIDAZOLAM HCL 2 MG/2ML IJ SOLN
INTRAMUSCULAR | Status: AC
  Filled 2016-04-09: qty 2

## 2016-04-09 MED ORDER — PHENYLEPHRINE 40 MCG/ML (10ML) SYRINGE FOR IV PUSH (FOR BLOOD PRESSURE SUPPORT)
PREFILLED_SYRINGE | INTRAVENOUS | Status: AC
  Filled 2016-04-09: qty 10

## 2016-04-09 MED ORDER — DIPHENHYDRAMINE HCL 25 MG PO CAPS: 25.0000 mg | ORAL_CAPSULE | Freq: Four times a day (QID) | ORAL | Status: DC | PRN

## 2016-04-09 MED ORDER — DEXAMETHASONE SODIUM PHOSPHATE 10 MG/ML IJ SOLN
10.0000 mg | Freq: Once | INTRAMUSCULAR | Status: AC
  Administered 2016-04-09: 10 mg via INTRAVENOUS

## 2016-04-09 MED ORDER — METOCLOPRAMIDE HCL 5 MG PO TABS: 5.0000 mg | ORAL_TABLET | Freq: Three times a day (TID) | ORAL | Status: DC | PRN

## 2016-04-09 MED ORDER — METHOCARBAMOL 1000 MG/10ML IJ SOLN
500.0000 mg | Freq: Four times a day (QID) | INTRAMUSCULAR | Status: DC | PRN
  Administered 2016-04-09: 500 mg via INTRAVENOUS
  Filled 2016-04-09: qty 550
  Filled 2016-04-09: qty 5

## 2016-04-09 MED ORDER — ASPIRIN 81 MG PO CHEW
81.0000 mg | CHEWABLE_TABLET | Freq: Two times a day (BID) | ORAL | Status: DC
  Administered 2016-04-09 – 2016-04-10 (×2): 81 mg via ORAL
  Filled 2016-04-09 (×2): qty 1

## 2016-04-09 MED ORDER — HYDROCHLOROTHIAZIDE 25 MG PO TABS: 25.0000 mg | ORAL_TABLET | Freq: Every day | ORAL | Status: DC

## 2016-04-09 MED ORDER — FERROUS SULFATE 325 (65 FE) MG PO TABS
325.0000 mg | ORAL_TABLET | Freq: Three times a day (TID) | ORAL | Status: DC
  Administered 2016-04-09 – 2016-04-10 (×3): 325 mg via ORAL
  Filled 2016-04-09 (×3): qty 1

## 2016-04-09 MED ORDER — STERILE WATER FOR IRRIGATION IR SOLN
Status: DC | PRN
  Administered 2016-04-09: 3000 mL

## 2016-04-09 MED ORDER — ALUM & MAG HYDROXIDE-SIMETH 200-200-20 MG/5ML PO SUSP: 30.0000 mL | ORAL | Status: DC | PRN

## 2016-04-09 MED ORDER — ONDANSETRON HCL 4 MG/2ML IJ SOLN
INTRAMUSCULAR | Status: DC | PRN
  Administered 2016-04-09: 4 mg via INTRAVENOUS

## 2016-04-09 MED ORDER — SODIUM CHLORIDE 0.9 % IV SOLN
INTRAVENOUS | Status: DC
  Administered 2016-04-09: 20:00:00 via INTRAVENOUS

## 2016-04-09 MED ORDER — MENTHOL 3 MG MT LOZG: 1.0000 | LOZENGE | OROMUCOSAL | Status: DC | PRN

## 2016-04-09 MED ORDER — METOCLOPRAMIDE HCL 5 MG/ML IJ SOLN: 5.0000 mg | Freq: Three times a day (TID) | INTRAMUSCULAR | Status: DC | PRN

## 2016-04-09 MED ORDER — MIDAZOLAM HCL 5 MG/5ML IJ SOLN
INTRAMUSCULAR | Status: DC | PRN
  Administered 2016-04-09: 2 mg via INTRAVENOUS

## 2016-04-09 MED ORDER — BISACODYL 10 MG RE SUPP: 10.0000 mg | Freq: Every day | RECTAL | Status: DC | PRN

## 2016-04-09 SURGICAL SUPPLY — 37 items
ADH SKN CLS APL DERMABOND .7 (GAUZE/BANDAGES/DRESSINGS) ×1
BAG DECANTER FOR FLEXI CONT (MISCELLANEOUS) IMPLANT
BAG SPEC THK2 15X12 ZIP CLS (MISCELLANEOUS)
BAG ZIPLOCK 12X15 (MISCELLANEOUS) IMPLANT
CAPT HIP TOTAL 2 ×2 IMPLANT
CLOTH BEACON ORANGE TIMEOUT ST (SAFETY) ×3 IMPLANT
COVER PERINEAL POST (MISCELLANEOUS) ×3 IMPLANT
DERMABOND ADVANCED (GAUZE/BANDAGES/DRESSINGS) ×2
DERMABOND ADVANCED .7 DNX12 (GAUZE/BANDAGES/DRESSINGS) ×1 IMPLANT
DRAPE STERI IOBAN 125X83 (DRAPES) ×3 IMPLANT
DRAPE U-SHAPE 47X51 STRL (DRAPES) ×6 IMPLANT
DRESSING AQUACEL AG SP 3.5X10 (GAUZE/BANDAGES/DRESSINGS) ×1 IMPLANT
DRSG AQUACEL AG SP 3.5X10 (GAUZE/BANDAGES/DRESSINGS) ×3
DURAPREP 26ML APPLICATOR (WOUND CARE) ×3 IMPLANT
ELECT REM PT RETURN 15FT ADLT (MISCELLANEOUS) IMPLANT
ELECT REM PT RETURN 9FT ADLT (ELECTROSURGICAL) ×3
ELECTRODE REM PT RTRN 9FT ADLT (ELECTROSURGICAL) ×1 IMPLANT
GLOVE BIOGEL M STRL SZ7.5 (GLOVE) IMPLANT
GLOVE BIOGEL PI IND STRL 7.5 (GLOVE) ×1 IMPLANT
GLOVE BIOGEL PI IND STRL 8.5 (GLOVE) ×1 IMPLANT
GLOVE BIOGEL PI INDICATOR 7.5 (GLOVE) ×14
GLOVE BIOGEL PI INDICATOR 8.5 (GLOVE) ×2
GLOVE ECLIPSE 8.0 STRL XLNG CF (GLOVE) ×6 IMPLANT
GLOVE ORTHO TXT STRL SZ7.5 (GLOVE) ×3 IMPLANT
GOWN STRL REUS W/TWL LRG LVL3 (GOWN DISPOSABLE) ×7 IMPLANT
GOWN STRL REUS W/TWL XL LVL3 (GOWN DISPOSABLE) ×3 IMPLANT
HOLDER FOLEY CATH W/STRAP (MISCELLANEOUS) ×3 IMPLANT
PACK ANTERIOR HIP CUSTOM (KITS) ×3 IMPLANT
SAW OSC TIP CART 19.5X105X1.3 (SAW) ×3 IMPLANT
SUT MNCRL AB 4-0 PS2 18 (SUTURE) ×3 IMPLANT
SUT VIC AB 1 CT1 36 (SUTURE) ×9 IMPLANT
SUT VIC AB 2-0 CT1 27 (SUTURE) ×6
SUT VIC AB 2-0 CT1 TAPERPNT 27 (SUTURE) ×2 IMPLANT
SUT VLOC 180 0 24IN GS25 (SUTURE) ×3 IMPLANT
TRAY FOLEY W/METER SILVER 16FR (SET/KITS/TRAYS/PACK) IMPLANT
WATER STERILE IRR 1500ML POUR (IV SOLUTION) ×3 IMPLANT
YANKAUER SUCT BULB TIP 10FT TU (MISCELLANEOUS) IMPLANT

## 2016-04-09 NOTE — Addendum Note (Signed)
Addendum  created 04/09/16 1248 by Franne Grip, MD   Anesthesia Attestations filed

## 2016-04-09 NOTE — Evaluation (Signed)
Physical Therapy Evaluation Patient Details Name: Patricia Lee MRN: [id number] DOB: April 10, 1946 Today's Date: 04/09/2016   History of Present Illness  70 yo female s/p R THA-direct anterior 04/09/16  Clinical Impression  On eval, pt required Min assist for mobility. She walked ~30 feet with a RW. Pain rated 8/10 with activity. Pt had the most difficulty with transitions (supine to sit, stand to sit). Will follow and progress activity as tolerated.     Follow Up Recommendations Home health PT    Equipment Recommendations  None recommended by PT    Recommendations for Other Services       Precautions / Restrictions Precautions Precautions: Fall Restrictions Weight Bearing Restrictions: No RLE Weight Bearing: Weight bearing as tolerated      Mobility  Bed Mobility Overal bed mobility: Needs Assistance Bed Mobility: Supine to Sit     Supine to sit: Min assist     General bed mobility comments: Assist for R LE. Pt had difficulty and discomfort with sitting EOB.   Transfers Overall transfer level: Needs assistance Equipment used: Rolling walker (2 wheeled) Transfers: Sit to/from Stand Sit to Stand: Min assist;From elevated surface         General transfer comment: Assist to rise, stabilize, control descent. VCs safety, technique, hand/LE placement  Ambulation/Gait Ambulation/Gait assistance: Min guard Ambulation Distance (Feet): 30 Feet Assistive device: Rolling walker (2 wheeled) Gait Pattern/deviations: Step-to pattern;Decreased stride length;Antalgic     General Gait Details: close guard for safety. followed with recliner.   Stairs            Wheelchair Mobility    Modified Rankin (Stroke Patients Only)       Balance                                             Pertinent Vitals/Pain Pain Assessment: 0-10 Pain Score: 8  Pain Location: R hip/thigh with transitions Pain Descriptors / Indicators: Grimacing;Guarding Pain  Intervention(s): Limited activity within patient's tolerance;Repositioned;Ice applied    Home Living Family/patient expects to be discharged to:: Private residence Living Arrangements: Alone Available Help at Discharge: Family;Friend(s) Type of Home: House Home Access: Stairs to enter   [employer] of Steps: 1 Home Layout: One level Home Equipment: [place] - 2 wheels;Cane - single point      Prior Function Level of Independence: Independent               Hand Dominance        Extremity/Trunk Assessment   Upper Extremity Assessment: Defer to OT evaluation           Lower Extremity Assessment: Generalized weakness      Cervical / Trunk Assessment: Normal  Communication   Communication: No difficulties  Cognition Arousal/Alertness: Awake/alert Behavior During Therapy: WFL for tasks assessed/performed Overall Cognitive Status: Within Functional Limits for tasks assessed                      General Comments      Exercises     Assessment/Plan    PT Assessment Patient needs continued PT services  PT Problem List Decreased strength;Decreased mobility;Decreased range of motion;Decreased activity tolerance;Decreased balance;Pain;Decreased knowledge of use of DME          PT Treatment Interventions DME instruction;Therapeutic activities;Gait training;Therapeutic exercise;Patient/family education;Functional mobility training;Balance training;Stair training    PT Goals (Current goals  can be found in the Care Plan section)  Acute Rehab PT Goals Patient Stated Goal: less pain PT Goal Formulation: With patient Time For Goal Achievement: 04/16/16 Potential to Achieve Goals: Good    Frequency 7X/week   Barriers to discharge        Co-evaluation               End of Session Equipment Utilized During Treatment: Gait belt Activity Tolerance: Patient limited by pain Patient left: in chair;with call bell/phone within reach;with  family/visitor present           Time: [id number] PT Time Calculation (min) (ACUTE ONLY): 20 min   Charges:   PT Evaluation $PT Eval Low Complexity: 1 Procedure     PT G Codes:        Weston Anna, MPT Pager: [phone]

## 2016-04-09 NOTE — Transfer of Care (Signed)
Immediate Anesthesia Transfer of Care Note  Patient: Patricia Lee  Procedure(s) Performed: Procedure(s): RIGHT TOTAL HIP ARTHROPLASTY ANTERIOR APPROACH (Right)  Patient Location: PACU  Anesthesia Type:Spinal  Level of Consciousness:  sedated, patient cooperative and responds to stimulation  Airway & Oxygen Therapy:Patient Spontanous Breathing and Patient connected to face mask oxgen  Post-op Assessment:  Report given to PACU RN and Post -op Vital signs reviewed and stable  Post vital signs:  Reviewed and stable  Last Vitals:  Vitals:   04/09/16 0523  BP: (!) 160/71  Pulse: 69  Resp: 16  Temp: [id number] C    Complications: No apparent anesthesia complications

## 2016-04-09 NOTE — Care Management Note (Signed)
Case Management Note  Patient Details  Name: Patricia Lee MRN: [id number] Date of Birth: November 25, 1945  Subjective/Objective:                  RIGHT TOTAL HIP ARTHROPLASTY ANTERIOR APPROACH (Right) Action/Plan: Discharge planning Expected Discharge Date:  04/10/16               Expected Discharge Plan:  [place]  In-House Referral:     Discharge planning Services  CM Consult  Post Acute Care Choice:  Home Health Choice offered to:  Patient  DME Arranged:  3-N-1 DME Agency:  [place]:  PT [place] Agency:  [facility] (formerly [facility])  Status of Service:  Completed, signed off  If discussed at [employer] of Stay Meetings, dates discussed:    Additional Comments: CM met with pt in room to offer choice of home health agency.  Pt chooses [facility] to render HHPT.  Referral called to [facility] rep, Stanton Kidney.  CM notified [place] DME rep, Jermaine to please deliver the 3n1 to room prior to discharge.  NO other CM needs were communicated. Dellie Catholic, RN 04/09/2016, 1:41 PM

## 2016-04-09 NOTE — Anesthesia Preprocedure Evaluation (Addendum)
Anesthesia Evaluation  Patient identified by MRN, date of birth, ID band Patient awake    Reviewed: Allergy & Precautions, NPO status , Patient's Chart, lab work & pertinent test results  Airway Mallampati: II  TM Distance: >3 FB Neck ROM: Full    Dental no notable dental hx.    Pulmonary former smoker,    Pulmonary exam normal breath sounds clear to auscultation       Cardiovascular hypertension, Pt. on medications Normal cardiovascular exam Rhythm:Regular Rate:Normal     Neuro/Psych negative neurological ROS  negative psych ROS   GI/Hepatic negative GI ROS, Neg liver ROS,   Endo/Other  negative endocrine ROS  Renal/GU negative Renal ROS  negative genitourinary   Musculoskeletal negative musculoskeletal ROS (+)   Abdominal   Peds negative pediatric ROS (+)  Hematology negative hematology ROS (+)   Anesthesia Other Findings   Reproductive/Obstetrics negative OB ROS                             Anesthesia Physical Anesthesia Plan  ASA: II  Anesthesia Plan: Spinal   Post-op Pain Management:    Induction: Intravenous  Airway Management Planned:   Additional Equipment:   Intra-op Plan:   Post-operative Plan:   Informed Consent: I have reviewed the patients History and Physical, chart, labs and discussed the procedure including the risks, benefits and alternatives for the proposed anesthesia with the patient or authorized representative who has indicated his/her understanding and acceptance.   Dental advisory given  Plan Discussed with: CRNA  Anesthesia Plan Comments: (Discussed risks and benefits of and differences between spinal and general. Discussed risks of spinal including headache, backache, failure, bleeding and hematoma, infection, and nerve damage. Patient consents to spinal. Questions answered. Platelet count acceptable.)       Anesthesia Quick Evaluation

## 2016-04-09 NOTE — Op Note (Signed)
NAME:  Patricia Lee                ACCOUNT NO.: [id number]      MEDICAL RECORD NO.: [id number]      FACILITY:  [facility]      PHYSICIAN:  Paralee Cancel D  DATE OF BIRTH:  06-05-1946     DATE OF PROCEDURE:  04/09/2016                                 OPERATIVE REPORT         PREOPERATIVE DIAGNOSIS: Right  hip osteoarthritis.      POSTOPERATIVE DIAGNOSIS:  Right hip osteoarthritis.      PROCEDURE:  Right total hip replacement through an anterior approach   utilizing DePuy THR system, component size 93mm pinnacle cup, a size 32+4 neutral   Altrex liner, a size 4 Hi Tri Lock stem with a 32+1 delta ceramic   ball.      SURGEON:  Pietro Cassis. Alvan Dame, M.D.      ASSISTANT:  Molli Barrows, PA-C     ANESTHESIA:  Spinal.      SPECIMENS:  None.      COMPLICATIONS:  None.      BLOOD LOSS:  250 cc     DRAINS:  None.      INDICATION OF THE PROCEDURE:  Patricia Lee is a 70 y.o. female who had   presented to office for evaluation of right hip pain.  Radiographs revealed   progressive degenerative changes with bone-on-bone   articulation to the  hip joint.  The patient had painful limited range of   motion significantly affecting their overall quality of life.  The patient was failing to    respond to conservative measures, and at this point was ready   to proceed with more definitive measures.  The patient has noted progressive   degenerative changes in his hip, progressive problems and dysfunction   with regarding the hip prior to surgery.  Consent was obtained for   benefit of pain relief.  Specific risk of infection, DVT, component   failure, dislocation, need for revision surgery, as well discussion of   the anterior versus posterior approach were reviewed.  Consent was   obtained for benefit of anterior pain relief through an anterior   approach.      PROCEDURE IN DETAIL:  The patient was brought to operative theater.   Once adequate anesthesia, preoperative  antibiotics, 2gm of Ancef, 1 gm of Tranexamic Acid, and 10 mg of Decadron administered.   The patient was positioned supine on the OSI Hanna table.  Once adequate   padding of boney process was carried out, we had predraped out the hip, and  used fluoroscopy to confirm orientation of the pelvis and position.      The right hip was then prepped and draped from proximal iliac crest to   mid thigh with shower curtain technique.      Time-out was performed identifying the patient, planned procedure, and   extremity.     An incision was then made 2 cm distal and lateral to the   anterior superior iliac spine extending over the orientation of the   tensor fascia lata muscle and sharp dissection was carried down to the   fascia of the muscle and protractor placed in the soft tissues.      The fascia was  then incised.  The muscle belly was identified and swept   laterally and retractor placed along the superior neck.  Following   cauterization of the circumflex vessels and removing some pericapsular   fat, a second cobra retractor was placed on the inferior neck.  A third   retractor was placed on the anterior acetabulum after elevating the   anterior rectus.  A L-capsulotomy was along the line of the   superior neck to the trochanteric fossa, then extended proximally and   distally.  Tag sutures were placed and the retractors were then placed   intracapsular.  We then identified the trochanteric fossa and   orientation of my neck cut, confirmed this radiographically   and then made a neck osteotomy with the femur on traction.  The femoral   head was removed without difficulty or complication.  Traction was let   off and retractors were placed posterior and anterior around the   acetabulum.      The labrum and foveal tissue were debrided.  I began reaming with a 22mm   reamer and reamed up to 29mm reamer with good bony bed preparation and a 45mm   cup was chosen.  The final 19mm Pinnacle cup  was then impacted under fluoroscopy  to confirm the depth of penetration and orientation with respect to   abduction.  A screw was placed followed by the hole eliminator.  The final   32+4 neutral Altrex liner was impacted with good visualized rim fit.  The cup was positioned anatomically within the acetabular portion of the pelvis.      At this point, the femur was rolled at 80 degrees.  Further capsule was   released off the inferior aspect of the femoral neck.  I then   released the superior capsule proximally.  The hook was placed laterally   along the femur and elevated manually and held in position with the bed   hook.  The leg was then extended and adducted with the leg rolled to 100   degrees of external rotation.  Once the proximal femur was fully   exposed, I used a box osteotome to set orientation.  I then began   broaching with the starting chili pepper broach and passed this by hand and then broached up to 4.  With the 4 broach in place I chose a high offset neck and did trial reductions.  The offset was appropriate, leg lengths   appeared to be equal, confirmed radiographically.   Given these findings, I went ahead and dislocated the hip, repositioned all   retractors and positioned the right hip in the extended and abducted position.  The final 4 Hi Tri Lock stem was   chosen and it was impacted down to the level of neck cut.  Based on this   and the trial reduction, a 32+1 delta ceramic ball was chosen and   impacted onto a clean and dry trunnion, and the hip was reduced.  The   hip had been irrigated throughout the case again at this point.  I did   reapproximate the superior capsular leaflet to the anterior leaflet   using #1 Vicryl.  The fascia of the   tensor fascia lata muscle was then reapproximated using #1 Vicryl and #0 V-lock sutures.  The   remaining wound was closed with 2-0 Vicryl and running 4-0 Monocryl.   The hip was cleaned, dried, and dressed sterilely using  Dermabond and   Aquacel  dressing.  She was then brought   to recovery room in stable condition tolerating the procedure well.    Molli Barrows, PA-C was present for the entirety of the case involved from   preoperative positioning, perioperative retractor management, general   facilitation of the case, as well as primary wound closure as assistant.            Pietro Cassis Alvan Dame, M.D.        04/09/2016 8:26 AM

## 2016-04-09 NOTE — Anesthesia Postprocedure Evaluation (Signed)
Anesthesia Post Note  Patient: Patricia Lee  Procedure(s) Performed: Procedure(s) (LRB): RIGHT TOTAL HIP ARTHROPLASTY ANTERIOR APPROACH (Right)  Patient location during evaluation: PACU Anesthesia Type: Spinal Level of consciousness: oriented and awake and alert Pain management: pain level controlled Vital Signs Assessment: post-procedure vital signs reviewed and stable Respiratory status: spontaneous breathing, respiratory function stable and patient connected to nasal cannula oxygen Cardiovascular status: blood pressure returned to baseline and stable Postop Assessment: no headache, no backache, spinal receding and patient able to bend at knees Anesthetic complications: no    Last Vitals:  Vitals:   04/09/16 1136 04/09/16 1225  BP: 137/60 134/60  Pulse: 86 82  Resp: 18 17  Temp: 36.4 C 36.4 C    Last Pain:  Vitals:   04/09/16 1225  TempSrc: Oral                 Arya Luttrull J

## 2016-04-09 NOTE — Anesthesia Procedure Notes (Signed)
Spinal  Patient location during procedure: OR Start time: 04/09/2016 7:08 AM End time: 04/09/2016 7:18 AM Staffing Resident/CRNA: Lance Galas Performed: resident/CRNA  Preanesthetic Checklist Completed: patient identified, site marked, surgical consent, pre-op evaluation, timeout performed, IV checked, risks and benefits discussed and monitors and equipment checked Spinal Block Patient position: sitting Prep: DuraPrep Patient monitoring: heart rate, cardiac monitor, continuous pulse ox and blood pressure Approach: midline Location: L3-4 Injection technique: single-shot Needle Needle type: Spinocan  Needle gauge: 22 G Needle length: 9 cm Needle insertion depth: 6 cm Assessment Sensory level: T6 Additional Notes -heme, -para, VSS.  Lot and expiration date OK.  Pt tolerated well.

## 2016-04-09 NOTE — Interval H&P Note (Signed)
History and Physical Interval Note:  04/09/2016 6:55 AM  Patricia Lee  has presented today for surgery, with the diagnosis of right hip osteoarthritis  The various methods of treatment have been discussed with the patient and family. After consideration of risks, benefits and other options for treatment, the patient has consented to  Procedure(s): RIGHT TOTAL HIP ARTHROPLASTY ANTERIOR APPROACH (Right) as a surgical intervention .  The patient's history has been reviewed, patient examined, no change in status, stable for surgery.  I have reviewed the patient's chart and labs.  Questions were answered to the patient's satisfaction.     Mauri Pole

## 2016-04-10 DIAGNOSIS — E663 Overweight: Secondary | ICD-10-CM | POA: Diagnosis present

## 2016-04-10 LAB — BASIC METABOLIC PANEL
Anion gap: 4 — ABNORMAL LOW (ref 5–15)
BUN: 20 mg/dL (ref 6–20)
CALCIUM: 8.9 mg/dL (ref 8.9–10.3)
CO2: 27 mmol/L (ref 22–32)
CREATININE: 1.01 mg/dL — AB (ref 0.44–1.00)
Chloride: 109 mmol/L (ref 101–111)
GFR calc Af Amer: >60 mL/min (ref >60)
GFR, EST NON AFRICAN AMERICAN: 55 mL/min — AB (ref >60)
GLUCOSE: 136 mg/dL — AB (ref 65–99)
POTASSIUM: 4.2 mmol/L (ref 3.5–5.1)
SODIUM: 140 mmol/L (ref 135–145)

## 2016-04-10 LAB — CBC
HCT: 31.2 % — ABNORMAL LOW (ref 36.0–46.0)
Hemoglobin: 10.2 g/dL — ABNORMAL LOW (ref 12.0–15.0)
MCH: 29.6 pg (ref 26.0–34.0)
MCHC: 32.7 g/dL (ref 30.0–36.0)
MCV: 90.4 fL (ref 78.0–100.0)
PLATELETS: 231 10*3/uL (ref 150–400)
RBC: 3.45 MIL/uL — AB (ref 3.87–5.11)
RDW: 14.8 % (ref 11.5–15.5)
WBC: 10 10*3/uL (ref 4.0–10.5)

## 2016-04-10 MED ORDER — POLYETHYLENE GLYCOL 3350 17 G PO PACK: 17.0000 g | PACK | Freq: Two times a day (BID) | ORAL | 0 refills | Status: DC

## 2016-04-10 MED ORDER — FERROUS SULFATE 325 (65 FE) MG PO TABS: 325.0000 mg | ORAL_TABLET | Freq: Three times a day (TID) | ORAL | 3 refills | Status: DC

## 2016-04-10 MED ORDER — METHOCARBAMOL 500 MG PO TABS: 500.0000 mg | ORAL_TABLET | Freq: Four times a day (QID) | ORAL | 0 refills | Status: DC | PRN

## 2016-04-10 MED ORDER — DOCUSATE SODIUM 100 MG PO CAPS: 100.0000 mg | ORAL_CAPSULE | Freq: Two times a day (BID) | ORAL | 0 refills | Status: DC

## 2016-04-10 MED ORDER — HYDROCODONE-ACETAMINOPHEN 7.5-325 MG PO TABS: 1.0000 | ORAL_TABLET | ORAL | 0 refills | Status: DC | PRN

## 2016-04-10 MED ORDER — ASPIRIN 81 MG PO CHEW: 81.0000 mg | CHEWABLE_TABLET | Freq: Two times a day (BID) | ORAL | 0 refills | Status: AC

## 2016-04-10 NOTE — Discharge Instructions (Signed)

## 2016-04-10 NOTE — Progress Notes (Signed)
Physical Therapy Treatment Patient Details Name: Patricia Lee MRN: [id number] DOB: 1945/12/25 Today's Date: 04/10/2016    History of Present Illness 70 yo female s/p R THA-direct anterior 04/09/16    PT Comments    Progressing with mobility. Pain rated 5/10 with activity. Reviewed/practiced exercises, gait training, and stair training. Verbally reviewed car transfer. All education completed. Ready to d/c from PT standpoint.   Follow Up Recommendations  Home health PT     Equipment Recommendations  None recommended by PT    Recommendations for Other Services       Precautions / Restrictions Precautions Precautions: Fall Restrictions Weight Bearing Restrictions: No RLE Weight Bearing: Weight bearing as tolerated    Mobility  Bed Mobility         Supine to sit: Min assist     General bed mobility comments: oob in recliner  Transfers Overall transfer level: Needs assistance Equipment used: Rolling walker (2 wheeled) Transfers: Sit to/from Stand Sit to Stand: Min guard         General transfer comment: close guard for safety. VCs hand/LE placement  Ambulation/Gait Ambulation/Gait assistance: Min guard Ambulation Distance (Feet): 100 Feet Assistive device: Rolling walker (2 wheeled) Gait Pattern/deviations: Step-to pattern;Antalgic;Decreased step length - right     General Gait Details: close guard for safety.    Stairs Stairs: Yes Stairs assistance: Min guard Stair Management: Step to pattern;Forwards;With walker Number of Stairs: 1 General stair comments: close guard for safety. VCs safety, technique, sequence. Family present to observe as well  Wheelchair Mobility    Modified Rankin (Stroke Patients Only)       Balance                                    Cognition Arousal/Alertness: Awake/alert Behavior During Therapy: WFL for tasks assessed/performed Overall Cognitive Status: Within Functional Limits for tasks assessed                       Exercises Total Joint Exercises Heel Slides: AAROM;Right;10 reps;Supine Hip ABduction/ADduction: AROM;Right;10 reps;Standing Long Arc Quad: AROM;Right;10 reps;Seated Knee Flexion: AROM;Right;10 reps;Standing Marching in Standing: AROM;Both;10 reps;Standing (decreased ROM R) General Exercises - Lower Extremity Heel Raises: AROM;Both;10 reps;Standing    General Comments        Pertinent Vitals/Pain Pain Assessment: 0-10 Pain Score: 5  Pain Location: R hip/thigh Pain Descriptors / Indicators: Sore Pain Intervention(s): Monitored during session;Repositioned;Ice applied    Home Living Family/patient expects to be discharged to:: Private residence Living Arrangements: Alone Available Help at Discharge: Family;Friend(s)         Home Equipment: Gilford Rile - 2 wheels;Cane - single point;Shower seat - built in      Prior Function Level of Independence: Independent          PT Goals (current goals can now be found in the care plan section) Acute Rehab PT Goals Patient Stated Goal: return to independence Progress towards PT goals: Progressing toward goals    Frequency    7X/week      PT Plan Current plan remains appropriate    Co-evaluation             End of Session Equipment Utilized During Treatment: Gait belt Activity Tolerance: Patient tolerated treatment well Patient left: in chair;with call bell/phone within reach;with family/visitor present     Time: [id number] PT Time Calculation (min) (ACUTE ONLY): 18 min  Charges:  $Gait  Training: 8-22 mins                    G Codes:      Weston Anna, MPT Pager: [phone]

## 2016-04-10 NOTE — Progress Notes (Signed)
Pt to d/c home with [facility] for PT. AVS reviewed and "My Chart" discussed with pt. Pt capable of verbalizing medications, signs and symptoms of infection, and follow-up appointments. Remains hemodynamically stable. No signs and symptoms of distress. Educated pt to return to ER in the case of SOB, dizziness, or chest pain.

## 2016-04-10 NOTE — Progress Notes (Signed)
     Subjective: 1 Day Post-Op Procedure(s) (LRB): RIGHT TOTAL HIP ARTHROPLASTY ANTERIOR APPROACH (Right)   Patient reports pain as mild, pain controlled. No events throughout the night. Ready to work with PT.  Ready to be discharged home, if she does well with PT.  Objective:   VITALS:   Vitals:   04/10/16 0148 04/10/16 0555  BP: 127/62 (!) 124/52  Pulse: 79 71  Resp: 16 16  Temp: 98.5 F (36.9 C) 98.4 F (36.9 C)    Dorsiflexion/Plantar flexion intact Incision: dressing C/D/I No cellulitis present Compartment soft  LABS  Recent Labs  04/10/16 0433  HGB 10.2*  HCT 31.2*  WBC 10.0  PLT 231     Recent Labs  04/10/16 0433  NA 140  K 4.2  BUN 20  CREATININE 1.01*  GLUCOSE 136*     Assessment/Plan: 1 Day Post-Op Procedure(s) (LRB): RIGHT TOTAL HIP ARTHROPLASTY ANTERIOR APPROACH (Right) Foley cath d/c'ed Advance diet Up with therapy D/C IV fluids Discharge home with home health Follow up in 2 weeks at [facility]. Follow up with OLIN,Layla Kesling D in 2 weeks.  Contact information:  [facility] [place] [address], [place] [id number]    Overweight (BMI 25-29.9) Estimated body mass index is 27.88 kg/m as calculated from the following:   Height as of this encounter: 5\' 7"  (1.702 m).   Weight as of this encounter: 80.7 kg (178 lb). Patient also counseled that weight may inhibit the healing process Patient counseled that losing weight will help with future health issues      West Pugh. Neelam Tiggs   PAC  04/10/2016, 9:21 AM

## 2016-04-10 NOTE — Evaluation (Signed)
Occupational Therapy Evaluation Patient Details Name: Patricia Lee MRN: [id number] DOB: 1946/05/09 Today's Date: 04/10/2016    History of Present Illness 70 yo female s/p R THA-direct anterior 04/09/16   Clinical Impression   Pt was admitted for the above sx. All education was completed. No further OT is needed at this time    Follow Up Recommendations  No OT follow up;Supervision/Assistance - 24 hour    Equipment Recommendations  3 in 1 bedside comode    Recommendations for Other Services       Precautions / Restrictions Precautions Precautions: Fall Restrictions Weight Bearing Restrictions: No RLE Weight Bearing: Weight bearing as tolerated      Mobility Bed Mobility         Supine to sit: Min assist     General bed mobility comments: Assist for R LE. Pt had difficulty and discomfort with sitting EOB.   Transfers   Equipment used: Rolling walker (2 wheeled)   Sit to Stand: Min guard         General transfer comment: for safety; cues for UE/LE placement    Balance                                            ADL Overall ADL's : Needs assistance/impaired     Grooming: Supervision/safety;Standing       Lower Body Bathing: Minimal assistance;Sit to/from stand       Lower Body Dressing: Maximal assistance;Sit to/from stand   Toilet Transfer: Min guard;Ambulation;BSC;RW   Toileting- Water quality scientist and Hygiene: Min guard;Sit to/from stand   Tub/ Shower Transfer: Walk-in shower;Min guard;Ambulation     General ADL Comments: Pt can perform UB adls with set up. She has a Secondary school teacher she can use for adls; she will also have assistance as needed.  She will benefit from 3:1; she doesn't have anything to push up from     [place]      Pertinent Vitals/Pain Pain Score: 5  Pain Location: R hip Pain Descriptors / Indicators: Sore Pain Intervention(s): Limited activity within patient's  tolerance;Monitored during session;Premedicated before session;Repositioned;Ice applied     Hand Dominance     Extremity/Trunk Assessment Upper Extremity Assessment Upper Extremity Assessment: Overall WFL for tasks assessed           Communication Communication Communication: No difficulties   Cognition Arousal/Alertness: Awake/alert Behavior During Therapy: WFL for tasks assessed/performed Overall Cognitive Status: Within Functional Limits for tasks assessed                     General Comments       Exercises       Shoulder Instructions      Home Living Family/patient expects to be discharged to:: Private residence Living Arrangements: Alone Available Help at Discharge: Family;Friend(s)               Bathroom Shower/Tub: Walk-in Corporate treasurer Toilet: Handicapped height     Home Equipment: Environmental consultant - 2 wheels;Cane - single point;Shower seat - built in          Prior Functioning/Environment Level of Independence: Independent                 OT Problem List:     OT Treatment/Interventions:      OT Goals(Current goals  can be found in the care plan section) Acute Rehab OT Goals Patient Stated Goal: return to independence OT Goal Formulation: All assessment and education complete, DC therapy  OT Frequency:     Barriers to D/C:            Co-evaluation              End of Session    Activity Tolerance: Patient tolerated treatment well Patient left: in bed;with call bell/phone within reach;with family/visitor present   Time: [id number] OT Time Calculation (min): 20 min Charges:  OT General Charges $OT Visit: 1 Procedure OT Evaluation $OT Eval Low Complexity: 1 Procedure G-Codes:    Patricia Lee April 24, 2016, 9:59 AM   Patricia Lee, OTR/L [phone] 04-24-2016

## 2016-04-11 DIAGNOSIS — Z9181 History of falling: Secondary | ICD-10-CM | POA: Diagnosis not present

## 2016-04-11 DIAGNOSIS — I10 Essential (primary) hypertension: Secondary | ICD-10-CM | POA: Diagnosis not present

## 2016-04-11 DIAGNOSIS — Z471 Aftercare following joint replacement surgery: Secondary | ICD-10-CM | POA: Diagnosis not present

## 2016-04-11 DIAGNOSIS — Z96641 Presence of right artificial hip joint: Secondary | ICD-10-CM | POA: Diagnosis not present

## 2016-04-11 DIAGNOSIS — Z7982 Long term (current) use of aspirin: Secondary | ICD-10-CM | POA: Diagnosis not present

## 2016-04-11 DIAGNOSIS — Z85828 Personal history of other malignant neoplasm of skin: Secondary | ICD-10-CM | POA: Diagnosis not present

## 2016-04-16 DIAGNOSIS — Z85828 Personal history of other malignant neoplasm of skin: Secondary | ICD-10-CM | POA: Diagnosis not present

## 2016-04-16 DIAGNOSIS — Z471 Aftercare following joint replacement surgery: Secondary | ICD-10-CM | POA: Diagnosis not present

## 2016-04-16 DIAGNOSIS — Z96641 Presence of right artificial hip joint: Secondary | ICD-10-CM | POA: Diagnosis not present

## 2016-04-16 DIAGNOSIS — Z9181 History of falling: Secondary | ICD-10-CM | POA: Diagnosis not present

## 2016-04-16 DIAGNOSIS — Z7982 Long term (current) use of aspirin: Secondary | ICD-10-CM | POA: Diagnosis not present

## 2016-04-16 DIAGNOSIS — I10 Essential (primary) hypertension: Secondary | ICD-10-CM | POA: Diagnosis not present

## 2016-04-17 DIAGNOSIS — Z7982 Long term (current) use of aspirin: Secondary | ICD-10-CM | POA: Diagnosis not present

## 2016-04-17 DIAGNOSIS — Z471 Aftercare following joint replacement surgery: Secondary | ICD-10-CM | POA: Diagnosis not present

## 2016-04-17 DIAGNOSIS — Z85828 Personal history of other malignant neoplasm of skin: Secondary | ICD-10-CM | POA: Diagnosis not present

## 2016-04-17 DIAGNOSIS — I10 Essential (primary) hypertension: Secondary | ICD-10-CM | POA: Diagnosis not present

## 2016-04-17 DIAGNOSIS — Z9181 History of falling: Secondary | ICD-10-CM | POA: Diagnosis not present

## 2016-04-17 DIAGNOSIS — Z96641 Presence of right artificial hip joint: Secondary | ICD-10-CM | POA: Diagnosis not present

## 2016-04-17 NOTE — Discharge Summary (Signed)
Physician Discharge Summary  Patient ID: HEAHTER BILEK MRN: [id number] DOB/AGE: 13-Sep-1945 70 y.o.  Admit date: 04/09/2016 Discharge date: 04/10/2016   Procedures:  Procedure(s) (LRB): RIGHT TOTAL HIP ARTHROPLASTY ANTERIOR APPROACH (Right)  Attending Physician:  Dr. Paralee Cancel   Admission Diagnoses:   Right hip primary OA / pain  Discharge Diagnoses:  Principal Problem:   S/P right THA, AA Active Problems:   Overweight (BMI 25.0-29.9)  Past Medical History:  Diagnosis Date  . Allergy    spring seasonal  . Arthritis    hip   . Hypertension     HPI:    Patricia Lee, 70 y.o. female, has a history of pain and functional disability in the right hip(s) due to arthritis and patient has failed non-surgical conservative treatments for greater than 12 weeks to include NSAID's and/or analgesics and activity modification.  Onset of symptoms was gradual starting ~2 years ago with gradually worsening course since that time.The patient noted no past surgery on the right hip(s).  Patient currently rates pain in the right hip at 7 out of 10 with activity. Patient has night pain, worsening of pain with activity and weight bearing, trendelenberg gait, pain that interfers with activities of daily living and pain with passive range of motion. Patient has evidence of periarticular osteophytes and joint space narrowing by imaging studies. This condition presents safety issues increasing the risk of falls. There is no current active infection.  Risks, benefits and expectations were discussed with the patient.  Risks including but not limited to the risk of anesthesia, blood clots, nerve damage, blood vessel damage, failure of the prosthesis, infection and up to and including death.  Patient understand the risks, benefits and expectations and wishes to proceed with surgery.   PCP: Joycelyn Man, MD   Discharged Condition: good  Hospital Course:  Patient underwent the above stated procedure on  04/09/2016. Patient tolerated the procedure well and brought to the recovery room in good condition and subsequently to the floor.  POD #1 BP: 124/52 ; Pulse: 71 ; Temp: 98.4 F (36.9 C) ; Resp: 16 Patient reports pain as mild, pain controlled. No events throughout the night. Ready to work with PT.  Ready to be discharged home. Dorsiflexion/plantar flexion intact, incision: dressing C/D/I, no cellulitis present and compartment soft.   LABS  Basename    HGB     10.2  HCT     31.2    Discharge Exam: General appearance: alert, cooperative and no distress Extremities: Homans sign is negative, no sign of DVT, no edema, redness or tenderness in the calves or thighs and no ulcers, gangrene or trophic changes  Disposition: Home with follow up in 2 weeks   Follow-up Information    [facility] .   Specialty:  Home Health Services Why:  home health physical therapy Contact information: [address] [place] [address] [phone]        Inc[facility] - [facility] .   Why:  3n1 (over the commode seat) Contact information: [address] [place] [address] [phone]        OLIN,Tenita Cue D, MD. Schedule an appointment as soon as possible for a visit in 2 week(s).   Specialty:  Orthopedic Surgery Contact information: [address] [place] [address] [phone]           Discharge Instructions    Call MD / Call 911    Complete by:  As directed    If you experience chest pain or shortness of breath, CALL 911 and be transported to the hospital emergency room.  If you develope a fever above 101 F, pus (white drainage) or increased drainage or redness at the wound, or calf pain, call your surgeon's office.   Change dressing    Complete by:  As directed    Maintain surgical dressing until follow up in the clinic. If the edges start to pull up, may reinforce with tape. If the dressing is no longer working, may remove and cover  with gauze and tape, but must keep the area dry and clean.  Call with any questions or concerns.   Constipation Prevention    Complete by:  As directed    Drink plenty of fluids.  Prune juice may be helpful.  You may use a stool softener, such as Colace (over the counter) 100 mg twice a day.  Use MiraLax (over the counter) for constipation as needed.   Diet - low sodium heart healthy    Complete by:  As directed    Discharge instructions    Complete by:  As directed    Maintain surgical dressing until follow up in the clinic. If the edges start to pull up, may reinforce with tape. If the dressing is no longer working, may remove and cover with gauze and tape, but must keep the area dry and clean.  Follow up in 2 weeks at [facility]. Call with any questions or concerns.   Increase activity slowly as tolerated    Complete by:  As directed    Weight bearing as tolerated with assist device (walker, cane, etc) as directed, use it as long as suggested by your surgeon or therapist, typically at least 4-6 weeks.   TED hose    Complete by:  As directed    Use stockings (TED hose) for 2 weeks on both leg(s).  You may remove them at night for sleeping.        Medication List    STOP taking these medications   ibuprofen 200 MG tablet Commonly known as:  ADVIL,MOTRIN     TAKE these medications   amLODipine 5 MG tablet Commonly known as:  NORVASC Take 1 tablet (5 mg total) by mouth daily. What changed:  how much to take  when to take this   aspirin 81 MG chewable tablet Chew 1 tablet (81 mg total) by mouth 2 (two) times daily. Take for 4 weeks.   CONCENTRATED FIBER PO Take 3 tablets by mouth daily.   [employer]S FISH OIL 1200 MG Caps Take 1,200 mg by mouth daily.   docusate sodium 100 MG capsule Commonly known as:  COLACE Take 1 capsule (100 mg total) by mouth 2 (two) times daily.   ferrous sulfate 325 (65 FE) MG tablet Take 1 tablet (325 mg total) by mouth 3 (three) times  daily after meals.   hydrochlorothiazide 25 MG tablet Commonly known as:  HYDRODIURIL Take 1 tablet (25 mg total) by mouth daily.   HYDROcodone-acetaminophen 7.5-325 MG tablet Commonly known as:  NORCO Take 1-2 tablets by mouth every 4 (four) hours as needed for moderate pain.   methocarbamol 500 MG tablet Commonly known as:  ROBAXIN Take 1 tablet (500 mg total) by mouth every 6 (six) hours as needed for muscle spasms.   multivitamin with minerals tablet Take 1 tablet by mouth daily.   polyethylene glycol packet Commonly known as:  MIRALAX / GLYCOLAX Take  17 g by mouth 2 (two) times daily.   SOOTHE 0.6-0.6 % Soln Generic drug:  Propylene Glycol-Glycerin Apply 2 drops to eye daily as needed (dry eyes).   Turmeric 500 MG Caps Take 500 mg by mouth daily.        Signed: [place] Pugh. Kaitlynne Wenz   PA-C  04/17/2016, 9:47 AM

## 2016-04-19 DIAGNOSIS — Z96641 Presence of right artificial hip joint: Secondary | ICD-10-CM | POA: Diagnosis not present

## 2016-04-19 DIAGNOSIS — Z471 Aftercare following joint replacement surgery: Secondary | ICD-10-CM | POA: Diagnosis not present

## 2016-04-19 DIAGNOSIS — Z7982 Long term (current) use of aspirin: Secondary | ICD-10-CM | POA: Diagnosis not present

## 2016-04-19 DIAGNOSIS — I10 Essential (primary) hypertension: Secondary | ICD-10-CM | POA: Diagnosis not present

## 2016-04-19 DIAGNOSIS — Z85828 Personal history of other malignant neoplasm of skin: Secondary | ICD-10-CM | POA: Diagnosis not present

## 2016-04-19 DIAGNOSIS — Z9181 History of falling: Secondary | ICD-10-CM | POA: Diagnosis not present

## 2016-04-23 DIAGNOSIS — Z7982 Long term (current) use of aspirin: Secondary | ICD-10-CM | POA: Diagnosis not present

## 2016-04-23 DIAGNOSIS — Z96641 Presence of right artificial hip joint: Secondary | ICD-10-CM | POA: Diagnosis not present

## 2016-04-23 DIAGNOSIS — Z9181 History of falling: Secondary | ICD-10-CM | POA: Diagnosis not present

## 2016-04-23 DIAGNOSIS — Z471 Aftercare following joint replacement surgery: Secondary | ICD-10-CM | POA: Diagnosis not present

## 2016-04-23 DIAGNOSIS — I10 Essential (primary) hypertension: Secondary | ICD-10-CM | POA: Diagnosis not present

## 2016-04-23 DIAGNOSIS — Z85828 Personal history of other malignant neoplasm of skin: Secondary | ICD-10-CM | POA: Diagnosis not present

## 2016-04-25 DIAGNOSIS — Z96641 Presence of right artificial hip joint: Secondary | ICD-10-CM | POA: Diagnosis not present

## 2016-04-25 DIAGNOSIS — Z471 Aftercare following joint replacement surgery: Secondary | ICD-10-CM | POA: Diagnosis not present

## 2016-04-25 DIAGNOSIS — Z85828 Personal history of other malignant neoplasm of skin: Secondary | ICD-10-CM | POA: Diagnosis not present

## 2016-04-25 DIAGNOSIS — I10 Essential (primary) hypertension: Secondary | ICD-10-CM | POA: Diagnosis not present

## 2016-04-25 DIAGNOSIS — Z7982 Long term (current) use of aspirin: Secondary | ICD-10-CM | POA: Diagnosis not present

## 2016-04-25 DIAGNOSIS — Z9181 History of falling: Secondary | ICD-10-CM | POA: Diagnosis not present

## 2016-04-26 DIAGNOSIS — I10 Essential (primary) hypertension: Secondary | ICD-10-CM | POA: Diagnosis not present

## 2016-04-26 DIAGNOSIS — Z96641 Presence of right artificial hip joint: Secondary | ICD-10-CM | POA: Diagnosis not present

## 2016-04-26 DIAGNOSIS — Z471 Aftercare following joint replacement surgery: Secondary | ICD-10-CM | POA: Diagnosis not present

## 2016-04-26 DIAGNOSIS — Z85828 Personal history of other malignant neoplasm of skin: Secondary | ICD-10-CM | POA: Diagnosis not present

## 2016-04-26 DIAGNOSIS — Z9181 History of falling: Secondary | ICD-10-CM | POA: Diagnosis not present

## 2016-04-26 DIAGNOSIS — Z7982 Long term (current) use of aspirin: Secondary | ICD-10-CM | POA: Diagnosis not present

## 2016-05-23 DIAGNOSIS — Z471 Aftercare following joint replacement surgery: Secondary | ICD-10-CM | POA: Diagnosis not present

## 2016-05-23 DIAGNOSIS — Z96641 Presence of right artificial hip joint: Secondary | ICD-10-CM | POA: Diagnosis not present

## 2016-08-01 DIAGNOSIS — Z1231 Encounter for screening mammogram for malignant neoplasm of breast: Secondary | ICD-10-CM | POA: Diagnosis not present

## 2016-08-01 LAB — HM MAMMOGRAPHY

## 2016-08-05 DIAGNOSIS — N6489 Other specified disorders of breast: Secondary | ICD-10-CM | POA: Diagnosis not present

## 2016-08-05 DIAGNOSIS — R922 Inconclusive mammogram: Secondary | ICD-10-CM | POA: Diagnosis not present

## 2016-08-09 ENCOUNTER — Encounter: Payer: Self-pay | Admitting: Family Medicine

## 2016-09-05 ENCOUNTER — Telehealth: Payer: Self-pay | Admitting: Family Medicine

## 2016-09-05 NOTE — Telephone Encounter (Signed)
Patient request to transfer care from Dr. Stevie Kern to [facility].

## 2016-09-05 NOTE — Telephone Encounter (Signed)
OK with me.

## 2016-09-29 ENCOUNTER — Encounter: Payer: Self-pay | Admitting: Family Medicine

## 2016-10-16 NOTE — Telephone Encounter (Signed)
Okay 

## 2016-10-16 NOTE — Telephone Encounter (Signed)
Pt has been scheduled.  °

## 2016-10-21 ENCOUNTER — Encounter: Payer: Self-pay | Admitting: Family

## 2016-10-21 ENCOUNTER — Ambulatory Visit (INDEPENDENT_AMBULATORY_CARE_PROVIDER_SITE_OTHER): Payer: Medicare Other | Admitting: Family

## 2016-10-21 VITALS — BP 173/74 | HR 71 | Temp 98.1°F | Resp 16 | Ht 66.5 in | Wt 181.2 lb

## 2016-10-21 DIAGNOSIS — I10 Essential (primary) hypertension: Secondary | ICD-10-CM

## 2016-10-21 DIAGNOSIS — E785 Hyperlipidemia, unspecified: Secondary | ICD-10-CM

## 2016-10-21 HISTORY — DX: Hyperlipidemia, unspecified: E78.5

## 2016-10-21 MED ORDER — AMLODIPINE BESYLATE 10 MG PO TABS: ORAL_TABLET | ORAL | 3 refills | Status: DC

## 2016-10-21 NOTE — Assessment & Plan Note (Signed)
Discussed increased cv risk based on her BP/lipids etc.  Current calculated 10 yr risk is 25%.  Discussed possibility of statin to reduce her risk. She declines at this time but will give it some thought. repeat lipids today.

## 2016-10-21 NOTE — Assessment & Plan Note (Signed)
Uncontrolled. Will increase amlodipine from 2.5mg  bid to 5mg  bid. Continue hctz.

## 2016-10-21 NOTE — Progress Notes (Signed)
Subjective:    Patient ID: Patricia Lee, female    DOB: 11/11/1945, 71 y.o.   MRN: [id number]  HPI  Patricia Lee is a 71 yr old female who presents today to establish care.  She was previously followed by Dr. Stevie Kern at our [place] office.    pmhx includes Hypertension.  Current BP meds include amlodipine, hctz  Hyperlipidemia- She is not currently maintained on a statin.  Lab Results  Component Value Date   CHOL 247 (H) 10/12/2015   HDL 60.80 10/12/2015   LDLCALC 154 (H) 10/12/2015   LDLDIRECT 136.2 07/21/2012   TRIG 165.0 (H) 10/12/2015   CHOLHDL 4 10/12/2015   BP Readings from Last 3 Encounters:  10/21/16 (!) 173/74  04/10/16 (!) 132/54  04/03/16 (!) 151/88    Review of Systems    see HPI  Past Medical History:  Diagnosis Date  . Allergy    spring seasonal  . Arthritis    hip   . Hypertension      Social History   Social History  . Marital status: Widowed    Spouse name: N/A  . Number of children: N/A  . Years of education: N/A   Occupational History  . Not on file.   Social History Main Topics  . Smoking status: Former Research scientist (life sciences)  . Smokeless tobacco: Never Used  . Alcohol use 3.6 oz/week    6 Glasses of wine per week  . Drug use: No  . Sexual activity: Not on file   Other Topics Concern  . Not on file   Social History Narrative  . No narrative on file    Past Surgical History:  Procedure Laterality Date  . COLONOSCOPY    . NO PAST SURGERIES    . POLYPECTOMY    . 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 Colon cancer Father   . Stomach cancer Mother   . Colon polyps Neg Hx   . Rectal cancer Neg Hx     Allergies  Allergen Reactions  . Codeine Other (See Comments)    Tachycardia and insomnia  . Erythromycin Other (See Comments)    Tachycardia and insomnia  . Influenza Vaccines Other (See Comments)    Nerve reaction similar to shingles- all nerves in back on fire   . Prednisone Other (See Comments)    Tachycardia and insomnia  . Tetracyclines & Related Other (See Comments)    Tachycardia/insomnia    Current Outpatient Prescriptions on File Prior to Visit  Medication Sig Dispense Refill  . amLODipine (NORVASC) 5 MG tablet Take 1 tablet (5 mg total) by mouth daily. (Patient taking differently: Take 2.5 mg by mouth 2 (two) times daily. ) 90 tablet 4  . Calcium Polycarbophil (CONCENTRATED FIBER PO) Take 3 tablets by mouth daily.    . hydrochlorothiazide (HYDRODIURIL) 25 MG tablet Take 1 tablet (25 mg total) by mouth daily. 100 tablet 4  . Multiple Vitamins-Minerals (MULTIVITAMIN WITH MINERALS) tablet Take 1 tablet by mouth daily.      . Omega-3 Fatty Acids (CVS FISH OIL) 1200 MG CAPS Take 1,200 mg by mouth daily.    Marland Kitchen Propylene Glycol-Glycerin (SOOTHE) 0.6-0.6 % SOLN Apply 2 drops to eye daily as needed (dry eyes).    . Turmeric 500 MG CAPS Take 500  mg by mouth daily.     No current facility-administered medications on file prior to visit.     BP (!) 173/74 (BP Location: Right Arm, Cuff Size: Normal)   Pulse 71   Temp 98.1 F (36.7 C) (Oral)   Resp 16   Ht 5' 6.5" (1.689 m)   Wt 181 lb 3.2 oz (82.2 kg)   SpO2 100%   BMI 28.81 kg/m    Objective:   Physical Exam  Constitutional: She is oriented to person, place, and time. She appears well-developed and well-nourished.  HENT:  Head: Normocephalic and atraumatic.  Cardiovascular: Normal rate, regular rhythm and normal heart sounds.   No murmur heard. Pulmonary/Chest: Effort normal and breath sounds normal. No respiratory distress. She has no wheezes.  Musculoskeletal: She exhibits no edema.  Neurological: She is alert and oriented to person, place, and time.  Psychiatric: She has a normal mood and affect. Her behavior is normal. Judgment and thought content  normal.               Assessment & Plan:         Assessment & Plan:  Discussed shingrix vaccine- she will consider and let us know.

## 2016-10-21 NOTE — Patient Instructions (Signed)
Please complete lab work prior to leaving.   

## 2016-10-22 ENCOUNTER — Encounter: Payer: Self-pay | Admitting: Family

## 2016-10-22 ENCOUNTER — Other Ambulatory Visit: Payer: Self-pay | Admitting: Family

## 2016-10-22 LAB — LIPID PANEL
Cholesterol: 263 mg/dL — ABNORMAL HIGH (ref 0–200)
HDL: 67.9 mg/dL (ref >39.00)
LDL Cholesterol: 166 mg/dL — ABNORMAL HIGH (ref 0–99)
NONHDL: 194.96
Total CHOL/HDL Ratio: 4
Triglycerides: 147 mg/dL (ref 0.0–149.0)
VLDL: 29.4 mg/dL (ref 0.0–40.0)

## 2016-10-22 LAB — BASIC METABOLIC PANEL
BUN: 24 mg/dL — AB (ref 6–23)
CHLORIDE: 100 meq/L (ref 96–112)
CO2: 28 meq/L (ref 19–32)
CREATININE: 1.02 mg/dL (ref 0.40–1.20)
Calcium: 9.9 mg/dL (ref 8.4–10.5)
GFR: 56.73 mL/min — ABNORMAL LOW (ref >60.00)
Glucose, Bld: 85 mg/dL (ref 70–99)
Potassium: 4.4 mEq/L (ref 3.5–5.1)
Sodium: 135 mEq/L (ref 135–145)

## 2016-10-22 NOTE — Telephone Encounter (Signed)
Refill sent per [facility] refill protocol/SLS  

## 2016-10-28 DIAGNOSIS — H2513 Age-related nuclear cataract, bilateral: Secondary | ICD-10-CM | POA: Diagnosis not present

## 2016-10-28 DIAGNOSIS — H524 Presbyopia: Secondary | ICD-10-CM | POA: Diagnosis not present

## 2016-12-02 ENCOUNTER — Ambulatory Visit (INDEPENDENT_AMBULATORY_CARE_PROVIDER_SITE_OTHER): Payer: Medicare Other | Admitting: Family

## 2016-12-02 ENCOUNTER — Encounter: Payer: Self-pay | Admitting: Family

## 2016-12-02 VITALS — BP 149/70 | HR 65 | Temp 98.5°F | Resp 16 | Ht 66.5 in | Wt 180.2 lb

## 2016-12-02 DIAGNOSIS — I10 Essential (primary) hypertension: Secondary | ICD-10-CM | POA: Diagnosis not present

## 2016-12-02 DIAGNOSIS — R739 Hyperglycemia, unspecified: Secondary | ICD-10-CM

## 2016-12-02 DIAGNOSIS — E785 Hyperlipidemia, unspecified: Secondary | ICD-10-CM | POA: Diagnosis not present

## 2016-12-02 LAB — LIPID PANEL
Cholesterol: 232 mg/dL — ABNORMAL HIGH (ref 0–200)
HDL: 63.5 mg/dL (ref >39.00)
LDL CALC: 138 mg/dL — AB (ref 0–99)
NonHDL: 168.49
TRIGLYCERIDES: 154 mg/dL — AB (ref 0.0–149.0)
Total CHOL/HDL Ratio: 4
VLDL: 30.8 mg/dL (ref 0.0–40.0)

## 2016-12-02 LAB — COMPREHENSIVE METABOLIC PANEL
ALT: 11 U/L (ref 0–35)
AST: 19 U/L (ref 0–37)
Albumin: 4.2 g/dL (ref 3.5–5.2)
Alkaline Phosphatase: 82 U/L (ref 39–117)
BUN: 18 mg/dL (ref 6–23)
CO2: 30 meq/L (ref 19–32)
Calcium: 9.8 mg/dL (ref 8.4–10.5)
Chloride: 102 mEq/L (ref 96–112)
Creatinine, Ser: 0.97 mg/dL (ref 0.40–1.20)
GFR: 60.1 mL/min (ref >60.00)
Glucose, Bld: 94 mg/dL (ref 70–99)
POTASSIUM: 3.7 meq/L (ref 3.5–5.1)
SODIUM: 138 meq/L (ref 135–145)
Total Bilirubin: 0.7 mg/dL (ref 0.2–1.2)
Total Protein: 7.3 g/dL (ref 6.0–8.3)

## 2016-12-02 LAB — HEMOGLOBIN A1C: Hgb A1c MFr Bld: 6.2 % (ref 4.6–6.5)

## 2016-12-02 MED ORDER — AMLODIPINE BESYLATE 5 MG PO TABS
5.0000 mg | ORAL_TABLET | Freq: Every day | ORAL | 1 refills | Status: DC
Start: 2016-12-02 — End: 2016-12-02

## 2016-12-02 MED ORDER — AMLODIPINE BESYLATE 5 MG PO TABS: 5.0000 mg | ORAL_TABLET | Freq: Every day | ORAL | 1 refills | Status: DC

## 2016-12-02 NOTE — Patient Instructions (Signed)
Please complete lab work prior to leaving.  Continue healthy diet and regular exercise.  

## 2016-12-02 NOTE — Progress Notes (Signed)
Subjective:    Patient ID: Patricia Lee, female    DOB: 1946/01/16, 71 y.o.   MRN: [id number]  HPI  Patricia Lee is a 71 yr old female who presents today for complete physical.   Immunizations: up to date.  Declines shingrix Diet: healthy [facility], walks, aerobics Colonoscopy: 02/26/16 Dexa:  declines Pap Smear: she sees Patricia Lee in [place] 07/04/15 Mammogram: 08/05/16  HTN- noted swelling on amlodipine 10mg . She has changed to 5mg  once daily. She reports that her reading this am at home was 131/72 BP Readings from Last 3 Encounters:  12/02/16 (!) 149/70  10/21/16 (!) 173/74  04/10/16 (!) 132/54   No results found for: HGBA1C     Review of Systems  Constitutional: Negative for unexpected weight change.  HENT: Negative for hearing loss and rhinorrhea.   Eyes: Negative for visual disturbance.  Respiratory: Negative for cough.   Cardiovascular: Positive for leg swelling.  Gastrointestinal:       Occasional constipation, denies diarrhea  Genitourinary: Negative for dysuria and frequency.  Musculoskeletal: Negative for arthralgias.  Skin: Negative for rash.  Neurological: Negative for headaches.  Hematological: Negative for adenopathy.  Psychiatric/Behavioral:       Denies depression/anxiety   Past Medical History:  Diagnosis Date  . Allergy    spring seasonal  . Arthritis    hip   . Hyperlipidemia 10/21/2016  . Hypertension      Social History   Social History  . Marital status: Widowed    Spouse name: N/A  . Number of children: N/A  . Years of education: N/A   Occupational History  . Not on file.   Social History Main Topics  . Smoking status: Former Research scientist (life sciences)  . Smokeless tobacco: Never Used  . Alcohol use 3.6 oz/week    6 Glasses of wine per week     Comment: wine, 1-2 glasses every other day  . Drug use: No  . Sexual activity: Not on file   Other Topics Concern  . Not on file   Social History Narrative   2 sons, one  in [place] and one will be moving to [place].    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 COLONOSCOPY    . NO PAST SURGERIES    . POLYPECTOMY    . 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 Colon cancer Father        died at 38  . Stomach cancer Mother        died at 43  . Colon polyps Neg Hx   . Rectal cancer Neg Hx     Allergies  Allergen Reactions  . Codeine Other (See Comments)    Tachycardia and insomnia  . Erythromycin Other (See Comments)    Tachycardia and insomnia  . Influenza Vaccines Other (See Comments)    Nerve reaction similar to shingles- all nerves in back on fire   . Prednisone Other (See Comments)    Tachycardia and insomnia  . Tetracyclines & Related Other (See Comments)    Tachycardia/insomnia  Current Outpatient Prescriptions on File Prior to Visit  Medication Sig Dispense Refill  . Calcium Polycarbophil (CONCENTRATED FIBER PO) Take 3 tablets by mouth daily.    . hydrochlorothiazide (HYDRODIURIL) 25 MG tablet TAKE 1 TABLET BY MOUTH EVERY DAY 90 tablet 0  . Multiple Vitamins-Minerals (MULTIVITAMIN WITH MINERALS) tablet Take 1 tablet by mouth daily.      . Omega-3 Fatty Acids (CVS FISH OIL) 1200 MG CAPS Take 1,200 mg by mouth daily.    Marland Kitchen Propylene Glycol-Glycerin (SOOTHE) 0.6-0.6 % SOLN Apply 2 drops to eye daily as needed (dry eyes).    . Turmeric 500 MG CAPS Take 500 mg by mouth daily.     No current facility-administered medications on file prior to visit.     BP (!) 149/70 (BP Location: Left Arm, Cuff Size: Normal)   Pulse 65   Temp 98.5 F (36.9 C) (Oral)   Resp 16   Ht 5' 6.5" (1.689 m)   Wt 180 lb 3.2  oz (81.7 kg)   SpO2 99%   BMI 28.65 kg/m       Objective:   Physical Exam Physical Exam  Constitutional: She is oriented to person, place, and time. She appears well-developed and well-nourished. No distress.  HENT:  Head: Normocephalic and atraumatic.  Right Ear: Tympanic membrane and ear canal normal.  Left Ear: Tympanic membrane and ear canal normal.  Mouth/Throat: Oropharynx is clear and moist.  Eyes: Pupils are equal, round, and reactive to light. No scleral icterus.  Neck: Normal range of motion. No thyromegaly present.  Cardiovascular: Normal rate and regular rhythm.   No murmur heard. Pulmonary/Chest: Effort normal and breath sounds normal. No respiratory distress. He has no wheezes. She has no rales. She exhibits no tenderness.  Abdominal: Soft. Bowel sounds are normal. She exhibits no distension and no mass. There is no tenderness. There is no rebound and no guarding.  Musculoskeletal: She exhibits no edema.  Lymphadenopathy:    She has no cervical adenopathy.  Neurological: She is alert and oriented to person, place, and time. She has normal patellar reflexes. She exhibits normal muscle tone. Coordination normal.  Skin: Skin is warm and dry.  Psychiatric: She has a normal mood and affect. Her behavior is normal. Judgment and thought content normal.  Breasts: Examined lying Right: Without masses, retractions, discharge or axillary adenopathy.  Left: Without masses, retractions, discharge or axillary adenopathy.  Pelvic: deferred          Assessment & Lee:   Preventative care- discussed healthy diet, continuing regular exercise. Declines shingrix. Obtain routine lab work. Colonoscopy pap and mammo are up to date.   HTN- BP is acceptable for her age, continue current medications.        Assessment & Lee:

## 2016-12-06 ENCOUNTER — Other Ambulatory Visit: Payer: Self-pay | Admitting: Family

## 2016-12-06 ENCOUNTER — Encounter: Payer: Self-pay | Admitting: Family

## 2016-12-06 DIAGNOSIS — R7303 Prediabetes: Secondary | ICD-10-CM

## 2016-12-06 HISTORY — DX: Prediabetes: R73.03

## 2016-12-06 MED ORDER — ATORVASTATIN CALCIUM 10 MG PO TABS: 10.0000 mg | ORAL_TABLET | Freq: Every day | ORAL | 3 refills | Status: DC

## 2017-01-25 ENCOUNTER — Other Ambulatory Visit: Payer: Self-pay | Admitting: Family

## 2017-03-04 ENCOUNTER — Ambulatory Visit (INDEPENDENT_AMBULATORY_CARE_PROVIDER_SITE_OTHER): Payer: Medicare Other | Admitting: Family

## 2017-03-04 ENCOUNTER — Encounter: Payer: Self-pay | Admitting: Family

## 2017-03-04 VITALS — BP 154/76 | HR 68 | Temp 98.2°F | Resp 16 | Ht 66.5 in | Wt 180.6 lb

## 2017-03-04 DIAGNOSIS — E785 Hyperlipidemia, unspecified: Secondary | ICD-10-CM

## 2017-03-04 DIAGNOSIS — R739 Hyperglycemia, unspecified: Secondary | ICD-10-CM

## 2017-03-04 DIAGNOSIS — I10 Essential (primary) hypertension: Secondary | ICD-10-CM | POA: Diagnosis not present

## 2017-03-04 LAB — HEMOGLOBIN A1C: HEMOGLOBIN A1C: 6.1 % (ref 4.6–6.5)

## 2017-03-04 LAB — LIPID PANEL
CHOLESTEROL: 180 mg/dL (ref 0–200)
HDL: 69 mg/dL (ref >39.00)
LDL Cholesterol: 84 mg/dL (ref 0–99)
NONHDL: 110.54
Total CHOL/HDL Ratio: 3
Triglycerides: 131 mg/dL (ref 0.0–149.0)
VLDL: 26.2 mg/dL (ref 0.0–40.0)

## 2017-03-04 NOTE — Progress Notes (Signed)
Subjective:    Patient ID: Patricia Lee, female    DOB: 1945/08/25, 71 y.o.   MRN: [id number]  HPI  Patricia Lee is a 71 yr old female who presents today for follow up of her hypertension. On hctz 25mg  and amlodipine 5mg .    BP Readings from Last 3 Encounters:  03/04/17 (!) 154/76  12/02/16 (!) 149/70  10/21/16 (!) 173/74    Home BP is 102-135/62-70.  Reports healthy diet.  Denies any side effects.  Denies myalgia.   Toleraing statin.   Review of Systems See HPI  Past Medical History:  Diagnosis Date  . Allergy    spring seasonal  . Arthritis    hip   . 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 Hyperlipidemia 10/21/2016  . Hypertension      Social History   Social History  . Marital status: Widowed    Spouse name: N/A  . Number of children: N/A  . Years of education: N/A   Occupational History  . Not on file.   Social History Main Topics  . Smoking status: Former Research scientist (life sciences)  . Smokeless tobacco: Never Used  . Alcohol use 3.6 oz/week    6 Glasses of wine per week     Comment: wine, 1-2 glasses every other day  . Drug use: No  . Sexual activity: Not on file   Other Topics Concern  . Not on file   Social History Narrative   2 sons, one in [place] and one in [place].    Retired from [employer]   [facility] times a [facility]   Active in her church   Husband died in 09-25-2012       Past Surgical History:  Procedure Laterality Date  . COLONOSCOPY    . NO PAST SURGERIES    . POLYPECTOMY    . 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 Colon cancer Father        died at 81  . Stomach cancer Mother        died at 12  . Colon polyps Neg Hx   . Rectal cancer Neg Hx     Allergies  Allergen Reactions  . Codeine Other (See Comments)   Tachycardia and insomnia  . Erythromycin Other (See Comments)    Tachycardia and insomnia  . Influenza Vaccines Other (See Comments)    Nerve reaction similar to shingles- all nerves in back on fire   . Prednisone Other (See Comments)    Tachycardia and insomnia  . Tetracyclines & Related Other (See Comments)    Tachycardia/insomnia    Current Outpatient Prescriptions on File Prior to Visit  Medication Sig Dispense Refill  . amLODipine (NORVASC) 5 MG tablet Take 1 tablet (5 mg total) by mouth daily. 90 tablet 1  . aspirin EC 81 MG tablet Take 81 mg by mouth daily.    Marland Kitchen atorvastatin (LIPITOR) 10 MG tablet Take 1 tablet (10 mg total) by mouth daily. 30 tablet 3  . Calcium Polycarbophil (CONCENTRATED FIBER PO) Take 3 tablets by mouth daily.    . hydrochlorothiazide (HYDRODIURIL) 25 MG tablet TAKE 1 TABLET BY MOUTH EVERY DAY 90 tablet 0  . Multiple Vitamins-Minerals (MULTIVITAMIN WITH MINERALS) tablet Take 1 tablet by mouth daily.      Marland Kitchen  Omega-3 Fatty Acids (CVS FISH OIL) 1200 MG CAPS Take 1,200 mg by mouth daily.    Marland Kitchen Propylene Glycol-Glycerin (SOOTHE) 0.6-0.6 % SOLN Apply 2 drops to eye daily as needed (dry eyes).    . Turmeric 500 MG CAPS Take 500 mg by mouth daily.     No current facility-administered medications on file prior to visit.     BP (!) 154/76 (BP Location: Right Arm, Cuff Size: Normal)   Pulse 68   Temp 98.2 F (36.8 C) (Oral)   Resp 16   Ht 5' 6.5" (1.689 m)   Wt 180 lb 9.6 oz (81.9 kg)   SpO2 100%   BMI 28.71 kg/m       Objective:   Physical Exam  Constitutional: She is oriented to person, place, and time. She appears well-developed and well-nourished.  HENT:  Head: Normocephalic and atraumatic.  Cardiovascular: Normal rate, regular rhythm and normal heart sounds.   No murmur heard. Pulmonary/Chest: Effort normal and breath sounds normal. No respiratory distress. She has no wheezes.  Neurological: She is alert and oriented to person, place, and time.    Psychiatric: She has a normal mood and affect. Her behavior is normal. Judgment and thought content normal.          Assessment & Plan:  Declines flu shot  Hyperlipidemia-tolerating statin, obtain follow up lipid panel.  Hyperglycemia- obtain follow up A1C.  HTN- has white coat HTN.  BP is much better at home and her home meter has been compared to our meter and is comparable in readings. Continue current meds.

## 2017-03-04 NOTE — Patient Instructions (Addendum)
Please schedule a medicare wellness visit with RN at your convenience. Complete lab work prior to leaving.  I'd like to see you back in 6 months please for routine follow up.

## 2017-03-18 ENCOUNTER — Encounter: Payer: Self-pay | Admitting: Family

## 2017-03-18 DIAGNOSIS — R739 Hyperglycemia, unspecified: Secondary | ICD-10-CM

## 2017-03-18 DIAGNOSIS — I10 Essential (primary) hypertension: Secondary | ICD-10-CM

## 2017-03-18 DIAGNOSIS — E785 Hyperlipidemia, unspecified: Secondary | ICD-10-CM

## 2017-03-18 MED ORDER — AMLODIPINE BESYLATE 5 MG PO TABS: 5.0000 mg | ORAL_TABLET | Freq: Every day | ORAL | 1 refills | Status: DC

## 2017-03-18 MED ORDER — ATORVASTATIN CALCIUM 10 MG PO TABS: 10.0000 mg | ORAL_TABLET | Freq: Every day | ORAL | 1 refills | Status: DC

## 2017-03-18 MED ORDER — HYDROCHLOROTHIAZIDE 25 MG PO TABS: 25.0000 mg | ORAL_TABLET | Freq: Every day | ORAL | 1 refills | Status: DC

## 2017-03-18 NOTE — Telephone Encounter (Signed)
Rxs sent to [facility] in error and cancelled. Rxs sent to [facility].

## 2017-03-18 NOTE — Addendum Note (Signed)
Addended by: Kelle Darting A on: 03/18/2017 05:06 PM   Modules accepted: Miquel Dunn

## 2017-06-09 ENCOUNTER — Other Ambulatory Visit: Payer: Self-pay | Admitting: Family

## 2017-06-10 DIAGNOSIS — J189 Pneumonia, unspecified organism: Secondary | ICD-10-CM

## 2017-06-10 HISTORY — DX: Pneumonia, unspecified organism: J18.9

## 2017-06-17 ENCOUNTER — Ambulatory Visit: Payer: Medicare Other | Admitting: Family

## 2017-07-28 ENCOUNTER — Other Ambulatory Visit: Payer: Self-pay | Admitting: Family

## 2017-08-14 LAB — HM MAMMOGRAPHY

## 2017-08-20 ENCOUNTER — Other Ambulatory Visit: Payer: Self-pay | Admitting: Family

## 2017-08-20 DIAGNOSIS — I10 Essential (primary) hypertension: Secondary | ICD-10-CM

## 2017-08-20 DIAGNOSIS — E785 Hyperlipidemia, unspecified: Secondary | ICD-10-CM

## 2017-08-20 DIAGNOSIS — R739 Hyperglycemia, unspecified: Secondary | ICD-10-CM

## 2017-09-02 ENCOUNTER — Encounter: Payer: Self-pay | Admitting: Family

## 2017-09-02 ENCOUNTER — Ambulatory Visit (INDEPENDENT_AMBULATORY_CARE_PROVIDER_SITE_OTHER): Payer: Medicare Other | Admitting: Family

## 2017-09-02 VITALS — BP 156/72 | HR 57 | Resp 16 | Ht 66.5 in | Wt 181.2 lb

## 2017-09-02 DIAGNOSIS — I10 Essential (primary) hypertension: Secondary | ICD-10-CM

## 2017-09-02 DIAGNOSIS — E785 Hyperlipidemia, unspecified: Secondary | ICD-10-CM | POA: Diagnosis not present

## 2017-09-02 LAB — COMPREHENSIVE METABOLIC PANEL
ALT: 13 U/L (ref 0–35)
AST: 19 U/L (ref 0–37)
Albumin: 4.2 g/dL (ref 3.5–5.2)
Alkaline Phosphatase: 82 U/L (ref 39–117)
BILIRUBIN TOTAL: 0.9 mg/dL (ref 0.2–1.2)
BUN: 22 mg/dL (ref 6–23)
CALCIUM: 9.9 mg/dL (ref 8.4–10.5)
CO2: 30 meq/L (ref 19–32)
Chloride: 101 mEq/L (ref 96–112)
Creatinine, Ser: 0.93 mg/dL (ref 0.40–1.20)
GFR: 62.96 mL/min (ref >60.00)
Glucose, Bld: 112 mg/dL — ABNORMAL HIGH (ref 70–99)
Potassium: 3.8 mEq/L (ref 3.5–5.1)
Sodium: 138 mEq/L (ref 135–145)
Total Protein: 7.9 g/dL (ref 6.0–8.3)

## 2017-09-02 LAB — HM PAP SMEAR: HM Pap smear: NEGATIVE

## 2017-09-02 MED ORDER — HYDROCHLOROTHIAZIDE 25 MG PO TABS: 25.0000 mg | ORAL_TABLET | Freq: Every day | ORAL | 1 refills | Status: DC

## 2017-09-02 MED ORDER — ATORVASTATIN CALCIUM 10 MG PO TABS: 10.0000 mg | ORAL_TABLET | Freq: Every day | ORAL | 1 refills | Status: DC

## 2017-09-02 NOTE — Patient Instructions (Signed)
Please complete lab work prior to leaving.   

## 2017-09-02 NOTE — Progress Notes (Signed)
Subjective:    Patient ID: Patricia Lee, female    DOB: July 14, 1945, 72 y.o.   MRN: [id number]  HPI  Patient is a 72 yr old female who presents today for follow up.  1) HTN- maintained on amlodipine and hctz. Brings home readings with her and sbp 112-130's.   BP Readings from Last 3 Encounters:  09/02/17 (!) 156/72  03/04/17 (!) 154/76  12/02/16 (!) 149/70   2) Hyperlipidemia- she denies myalgia on atorvastatin. Reports good compliance Lab Results  Component Value Date   CHOL 180 03/04/2017   HDL 69.00 03/04/2017   LDLCALC 84 03/04/2017   LDLDIRECT 136.2 07/21/2012   TRIG 131.0 03/04/2017   CHOLHDL 3 03/04/2017      Review of Systems See HPI  Past Medical History:  Diagnosis Date  . Allergy    spring seasonal  . Arthritis    hip   . 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 Hyperlipidemia 10/21/2016  . Hypertension      Social History   Socioeconomic History  . Marital status: Widowed    Spouse name: Not on file  . Number of children: Not on file  . Years of education: Not on file  . Highest education level: Not on file  Occupational History  . Not on file  Social Needs  . Financial resource strain: Not on file  . Food insecurity:    Worry: Not on file    Inability: Not on file  . Transportation needs:    Medical: Not on file    Non-medical: Not on file  Tobacco Use  . Smoking status: Former Research scientist (life sciences)  . Smokeless tobacco: Never Used  Substance and Sexual Activity  . Alcohol use: Yes    Alcohol/week: 3.6 oz    Types: 6 Glasses of wine per week    Comment: wine, 1-2 glasses every other day  . Drug use: No  . Sexual activity: Not on file  Lifestyle  . Physical activity:    Days per week: Not on file    Minutes per session: Not on file  . Stress: Not on file  Relationships  . Social connections:    Talks on phone: Not on file    Gets together: Not on file    Attends religious service: Not on file    Active member of club or organization: Not on file   Attends meetings of clubs or organizations: Not on file    Relationship status: Not on file  . Intimate partner violence:    Fear of current or ex partner: Not on file    Emotionally abused: Not on file    Physically abused: Not on file    Forced sexual activity: Not on file  Other Topics Concern  . Not on file  Social History Narrative   2 sons, one in [place] and one in [place].    Retired from [employer]   Enjoys going to the Y 3 times a week   Active in her church   Husband died in 2012/09/27    Past Surgical History:  Procedure Laterality Date  . COLONOSCOPY    . NO PAST SURGERIES    . POLYPECTOMY    . 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 Colon cancer Father  died at 25  . Stomach cancer Mother        died at 34  . Colon polyps Neg Hx   . Rectal cancer Neg Hx     Allergies  Allergen Reactions  . Codeine Other (See Comments)    Tachycardia and insomnia  . Erythromycin Other (See Comments)    Tachycardia and insomnia  . Influenza Vaccines Other (See Comments)    Nerve reaction similar to shingles- all nerves in back on fire   . Prednisone Other (See Comments)    Tachycardia and insomnia  . Tetracyclines & Related Other (See Comments)    Tachycardia/insomnia    Current Outpatient Medications on File Prior to Visit  Medication Sig Dispense Refill  . amLODipine (NORVASC) 5 MG tablet TAKE 1 TABLET BY MOUTH  DAILY 90 tablet 1  . aspirin EC 81 MG tablet Take 81 mg by mouth daily.    . Calcium Polycarbophil (CONCENTRATED FIBER PO) Take 3 tablets by mouth daily.    . Multiple Vitamins-Minerals (MULTIVITAMIN WITH MINERALS) tablet Take 1 tablet by mouth daily.      . Omega-3 Fatty Acids ([facility] FISH OIL) 1200 MG CAPS Take 1,200 mg by mouth daily.      Marland Kitchen Propylene Glycol-Glycerin (SOOTHE) 0.6-0.6 % SOLN Apply 2 drops to eye daily as needed (dry eyes).    . Turmeric 500 MG CAPS Take 500 mg by mouth daily.     No current facility-administered medications on file prior to visit.     BP (!) 156/72 (BP Location: Left Arm, Patient Position: Sitting, Cuff Size: Normal)   Pulse (!) 57   Resp 16   Ht 5' 6.5" (1.689 m)   Wt 181 lb 3.2 oz (82.2 kg)   SpO2 99%   BMI 28.81 kg/m   c    Objective:   Physical Exam  Constitutional: She is oriented to person, place, and time. She appears well-developed and well-nourished.  Cardiovascular: Normal rate and regular rhythm.  Pulmonary/Chest: Effort normal and breath sounds normal. No respiratory distress. She has no wheezes. She has no rales.  Musculoskeletal: She exhibits no edema.  Neurological: She is alert and oriented to person, place, and time.  Psychiatric: She has a normal mood and affect. Her behavior is normal. Judgment and thought content normal.          Assessment & Plan:  HTN- Has white coat elevation. stable at home on current meds.  Continue same, obtain follow up cmet.  Hyperlipidemia- at goal on statin. Advised pt OK to d/c aspirin 81mg .

## 2017-09-25 ENCOUNTER — Encounter: Payer: Self-pay | Admitting: Family

## 2018-01-08 ENCOUNTER — Other Ambulatory Visit: Payer: Self-pay | Admitting: Family

## 2018-01-13 ENCOUNTER — Encounter: Payer: Self-pay | Admitting: Family

## 2018-01-13 ENCOUNTER — Ambulatory Visit (INDEPENDENT_AMBULATORY_CARE_PROVIDER_SITE_OTHER): Payer: Medicare Other | Admitting: Family

## 2018-01-13 VITALS — BP 147/74 | HR 57 | Temp 98.4°F | Resp 16 | Ht 66.5 in | Wt 181.2 lb

## 2018-01-13 DIAGNOSIS — Z23 Encounter for immunization: Secondary | ICD-10-CM | POA: Diagnosis not present

## 2018-01-13 DIAGNOSIS — Z0000 Encounter for general adult medical examination without abnormal findings: Secondary | ICD-10-CM

## 2018-01-13 DIAGNOSIS — R739 Hyperglycemia, unspecified: Secondary | ICD-10-CM

## 2018-01-13 DIAGNOSIS — E785 Hyperlipidemia, unspecified: Secondary | ICD-10-CM | POA: Diagnosis not present

## 2018-01-13 LAB — COMPREHENSIVE METABOLIC PANEL
ALT: 11 U/L (ref 0–35)
AST: 16 U/L (ref 0–37)
Albumin: 4.1 g/dL (ref 3.5–5.2)
Alkaline Phosphatase: 83 U/L (ref 39–117)
BILIRUBIN TOTAL: 1.1 mg/dL (ref 0.2–1.2)
BUN: 19 mg/dL (ref 6–23)
CO2: 29 mEq/L (ref 19–32)
CREATININE: 1.01 mg/dL (ref 0.40–1.20)
Calcium: 9.7 mg/dL (ref 8.4–10.5)
Chloride: 99 mEq/L (ref 96–112)
GFR: 57.18 mL/min — ABNORMAL LOW (ref >60.00)
GLUCOSE: 102 mg/dL — AB (ref 70–99)
Potassium: 4.3 mEq/L (ref 3.5–5.1)
Sodium: 136 mEq/L (ref 135–145)
Total Protein: 7.1 g/dL (ref 6.0–8.3)

## 2018-01-13 LAB — LIPID PANEL
CHOLESTEROL: 182 mg/dL (ref 0–200)
HDL: 66.6 mg/dL (ref >39.00)
LDL CALC: 90 mg/dL (ref 0–99)
NonHDL: 115.09
TRIGLYCERIDES: 126 mg/dL (ref 0.0–149.0)
Total CHOL/HDL Ratio: 3
VLDL: 25.2 mg/dL (ref 0.0–40.0)

## 2018-01-13 LAB — HEMOGLOBIN A1C: HEMOGLOBIN A1C: 6.2 % (ref 4.6–6.5)

## 2018-01-13 NOTE — Progress Notes (Signed)
Subjective:    Patient ID: Patricia Lee, female    DOB: July 16, 1945, 72 y.o.   MRN: [id number]  HPI  Patient is a 72 yr old female who presents today for cpx.  Immunizations: due for declines shingrix Diet: fair diet Exercise:  Exercises 3 days a week- swims Colonoscopy: 9/17 Dexa: due- declines Pap Smear: N/A Mammogram: 08/01/16 Vision:  2 weeks ago Dental: up to date Wt Readings from Last 3 Encounters:  01/13/18 181 lb 3.2 oz (82.2 kg)  09/02/17 181 lb 3.2 oz (82.2 kg)  03/04/17 180 lb 9.6 oz (81.9 kg)        Review of Systems  Constitutional: Negative for unexpected weight change.  HENT: Negative for hearing loss and rhinorrhea.   Eyes: Negative for visual disturbance.  Respiratory: Negative for cough.   Cardiovascular: Positive for leg swelling.  Gastrointestinal: Negative for blood in stool, constipation and diarrhea.  Genitourinary: Negative for dysuria, frequency and hematuria.  Musculoskeletal: Negative for arthralgias and myalgias.  Neurological: Negative for headaches.  Hematological: Negative for adenopathy.  Psychiatric/Behavioral:       Denies depression/anxiety       Past Medical History:  Diagnosis Date  . Allergy    spring seasonal  . Arthritis    hip   . 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 Hyperlipidemia 10/21/2016  . Hypertension      Social History   Socioeconomic History  . Marital status: Widowed    Spouse name: Not on file  . Number of children: Not on file  . Years of education: Not on file  . Highest education level: Not on file  Occupational History  . Not on file  Social Needs  . Financial resource strain: Not on file  . Food insecurity:    Worry: Not on file    Inability: Not on file  . Transportation needs:    Medical: Not on file    Non-medical: Not on file  Tobacco Use  . Smoking status: Former Research scientist (life sciences)  . Smokeless tobacco: Never Used  Substance and Sexual Activity  . Alcohol use: Yes    Alcohol/week: 3.6 oz   Types: 6 Glasses of wine per week    Comment: wine, 1-2 glasses every other day  . Drug use: No  . Sexual activity: Not on file  Lifestyle  . Physical activity:    Days per week: Not on file    Minutes per session: Not on file  . Stress: Not on file  Relationships  . Social connections:    Talks on phone: Not on file    Gets together: Not on file    Attends religious service: Not on file    Active member of club or organization: Not on file    Attends meetings of clubs or organizations: Not on file    Relationship status: Not on file  . Intimate partner violence:    Fear of current or ex partner: Not on file    Emotionally abused: Not on file    Physically abused: Not on file    Forced sexual activity: Not on file  Other Topics Concern  . Not on file  Social History Narrative   2 sons, one in [place] and one in [place].    Retired from [employer]   Enjoys going to the Y 3 times a week   Active in her church   Husband died in 09-25-2012    Past Surgical History:  Procedure Laterality Date  .  COLONOSCOPY    . NO PAST SURGERIES    . POLYPECTOMY    . 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 Colon cancer Father        died at 23  . Stomach cancer Mother        died at 58  . Colon polyps Neg Hx   . Rectal cancer Neg Hx     Allergies  Allergen Reactions  . Codeine Other (See Comments)    Tachycardia and insomnia  . Erythromycin Other (See Comments)    Tachycardia and insomnia  . Influenza Vaccines Other (See Comments)    Nerve reaction similar to shingles- all nerves in back on fire   . Prednisone Other (See Comments)    Tachycardia and insomnia  . Tetracyclines & Related Other (See Comments)    Tachycardia/insomnia    Current Outpatient Medications  on File Prior to Visit  Medication Sig Dispense Refill  . amLODipine (NORVASC) 5 MG tablet TAKE 1 TABLET BY MOUTH  DAILY 90 tablet 1  . atorvastatin (LIPITOR) 10 MG tablet TAKE 1 TABLET BY MOUTH  DAILY 90 tablet 0  . Calcium Polycarbophil (CONCENTRATED FIBER PO) Take 3 tablets by mouth daily.    . hydrochlorothiazide (HYDRODIURIL) 25 MG tablet TAKE 1 TABLET BY MOUTH  DAILY 90 tablet 0  . Multiple Vitamins-Minerals (MULTIVITAMIN WITH MINERALS) tablet Take 1 tablet by mouth daily.      . Omega-3 Fatty Acids ([employer]S FISH OIL) 1200 MG CAPS Take 1,200 mg by mouth daily.    Marland Kitchen Propylene Glycol-Glycerin (SOOTHE) 0.6-0.6 % SOLN Apply 2 drops to eye daily as needed (dry eyes).    . Turmeric 500 MG CAPS Take 500 mg by mouth daily.     No current facility-administered medications on file prior to visit.     BP (!) 147/74 (BP Location: Right Arm, Patient Position: Sitting, Cuff Size: Small)   Pulse (!) 57   Temp 98.4 F (36.9 C) (Oral)   Resp 16   Ht 5' 6.5" (1.689 m)   Wt 181 lb 3.2 oz (82.2 kg)   SpO2 99%   BMI 28.81 kg/m    Objective:   Physical Exam  Physical Exam  Constitutional: She is oriented to person, place, and time. She appears well-developed and well-nourished. No distress.  HENT:  Head: Normocephalic and atraumatic.  Right Ear: Tympanic membrane and ear canal normal.  Left Ear: Tympanic membrane and ear canal normal.  Mouth/Throat: Oropharynx is clear and moist.  Eyes: Pupils are equal, round, and reactive to light. No scleral icterus.  Neck: Normal range of motion. No thyromegaly present.  Cardiovascular: Normal rate and regular rhythm.   No murmur heard. Pulmonary/Chest: Effort normal and breath sounds normal. No respiratory distress. He has no wheezes. She has no rales. She exhibits no tenderness.  Abdomen: no distension noted Musculoskeletal: She exhibits no edema.  Lymphadenopathy:    She has no cervical adenopathy.  Neurological: She is alert and oriented to person,  place, and time. She has normal patellar reflexes. She exhibits normal muscle tone. Coordination normal.  Skin: Skin is warm and dry.        Assessment & Plan:   Preventative care- encouraged pt to continue healthy diet and regular exercise. Will request mammogram results. Declines dexa.  Due for pneumovax booster today.      Assessment & Plan:

## 2018-01-13 NOTE — Patient Instructions (Signed)
Please complete lab work prior to leaving. Continue to work on healthy diet and regular exercise.  

## 2018-01-14 ENCOUNTER — Encounter: Payer: Self-pay | Admitting: Family

## 2018-02-23 ENCOUNTER — Other Ambulatory Visit: Payer: Self-pay | Admitting: Family

## 2018-02-23 DIAGNOSIS — R739 Hyperglycemia, unspecified: Secondary | ICD-10-CM

## 2018-02-23 DIAGNOSIS — I10 Essential (primary) hypertension: Secondary | ICD-10-CM

## 2018-02-23 DIAGNOSIS — E785 Hyperlipidemia, unspecified: Secondary | ICD-10-CM

## 2018-03-24 ENCOUNTER — Ambulatory Visit (INDEPENDENT_AMBULATORY_CARE_PROVIDER_SITE_OTHER): Payer: Medicare Other | Admitting: Family

## 2018-03-24 VITALS — BP 138/86 | HR 77 | Temp 98.4°F | Resp 16 | Ht 66.5 in | Wt 183.0 lb

## 2018-03-24 DIAGNOSIS — R300 Dysuria: Secondary | ICD-10-CM | POA: Diagnosis not present

## 2018-03-24 DIAGNOSIS — N3000 Acute cystitis without hematuria: Secondary | ICD-10-CM

## 2018-03-24 LAB — POC URINALSYSI DIPSTICK (AUTOMATED)
BILIRUBIN UA: NEGATIVE
Glucose, UA: NEGATIVE
Ketones, UA: NEGATIVE
NITRITE UA: NEGATIVE
Protein, UA: POSITIVE — AB
Spec Grav, UA: 1.015 (ref 1.010–1.025)
UROBILINOGEN UA: 0.2 U/dL
pH, UA: 6 (ref 5.0–8.0)

## 2018-03-24 MED ORDER — CEPHALEXIN 500 MG PO CAPS: 500.0000 mg | ORAL_CAPSULE | Freq: Two times a day (BID) | ORAL | 0 refills | Status: DC

## 2018-03-24 NOTE — Patient Instructions (Signed)
Please begin keflex for urinary tract infection. Call if new/worsening symptoms or if symptoms are not improved in 2-3 days.

## 2018-03-24 NOTE — Progress Notes (Signed)
Subjective:    Patient ID: Patricia Lee, female    DOB: March 07, 1946, 72 y.o.   MRN: [id number]  HPI  Patient is a 72 yr old female who presents today with chief complaint of dysuria x 2 days.  Denies dysuria, fever.  + frequency. No urinary odor.  Denies unusual low back pain.    Review of Systems See HPI  Past Medical History:  Diagnosis Date  . Allergy    spring seasonal  . Arthritis    hip   . 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 Hyperlipidemia 10/21/2016  . Hypertension      Social History   Socioeconomic History  . Marital status: Widowed    Spouse name: Not on file  . Number of children: Not on file  . Years of education: Not on file  . Highest education level: Not on file  Occupational History  . Not on file  Social Needs  . Financial resource strain: Not on file  . Food insecurity:    Worry: Not on file    Inability: Not on file  . Transportation needs:    Medical: Not on file    Non-medical: Not on file  Tobacco Use  . Smoking status: Former Research scientist (life sciences)  . Smokeless tobacco: Never Used  Substance and Sexual Activity  . Alcohol use: Yes    Alcohol/week: 6.0 standard drinks    Types: 6 Glasses of wine per week    Comment: wine, 1-2 glasses every other day  . Drug use: No  . Sexual activity: Not on file  Lifestyle  . Physical activity:    Days per week: Not on file    Minutes per session: Not on file  . Stress: Not on file  Relationships  . Social connections:    Talks on phone: Not on file    Gets together: Not on file    Attends religious service: Not on file    Active member of club or organization: Not on file    Attends meetings of clubs or organizations: Not on file    Relationship status: Not on file  . Intimate partner violence:    Fear of current or ex partner: Not on file    Emotionally abused: Not on file    Physically abused: Not on file    Forced sexual activity: Not on file  Other Topics Concern  . Not on file  Social History Narrative    2 sons, one in [place] and one in [place].    Retired from [employer]   Enjoys going to the Y 3 times a week   Active in her church   Husband died in 09/08/2012    Past Surgical History:  Procedure Laterality Date  . COLONOSCOPY    . NO PAST SURGERIES    . POLYPECTOMY    . 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 Colon cancer Father        died at 3  . Stomach cancer Mother        died at 36  . Colon polyps Neg Hx   . Rectal cancer Neg Hx     Allergies  Allergen Reactions  . Codeine Other (See Comments)    Tachycardia and insomnia  . Erythromycin Other (See Comments)  Tachycardia and insomnia  . Influenza Vaccines Other (See Comments)    Nerve reaction similar to shingles- all nerves in back on fire   . Prednisone Other (See Comments)    Tachycardia and insomnia  . Tetracyclines & Related Other (See Comments)    Tachycardia/insomnia    Current Outpatient Medications on File Prior to Visit  Medication Sig Dispense Refill  . amLODipine (NORVASC) 5 MG tablet TAKE 1 TABLET BY MOUTH  DAILY 90 tablet 2  . atorvastatin (LIPITOR) 10 MG tablet TAKE 1 TABLET BY MOUTH  DAILY 90 tablet 0  . Calcium Polycarbophil (CONCENTRATED FIBER PO) Take 3 tablets by mouth daily.    . hydrochlorothiazide (HYDRODIURIL) 25 MG tablet TAKE 1 TABLET BY MOUTH  DAILY 90 tablet 0  . Multiple Vitamins-Minerals (MULTIVITAMIN WITH MINERALS) tablet Take 1 tablet by mouth daily.      . Omega-3 Fatty Acids ([employer]S FISH OIL) 1200 MG CAPS Take 1,200 mg by mouth daily.    Marland Kitchen Propylene Glycol-Glycerin (SOOTHE) 0.6-0.6 % SOLN Apply 2 drops to eye daily as needed (dry eyes).    . Turmeric 500 MG CAPS Take 500 mg by mouth daily.     No current facility-administered medications on file prior to visit.      BP 138/86 (BP Location: Right Arm, Patient Position: Sitting, Cuff Size: Small)   Pulse 77   Temp 98.4 F (36.9 C) (Oral)   Resp 16   Ht 5' 6.5" (1.689 m)   Wt 183 lb (83 kg)   SpO2 98%   BMI 29.09 kg/m       Objective:   Physical Exam  Constitutional: She is oriented to person, place, and time. She appears well-developed and well-nourished.  Cardiovascular: Normal rate, regular rhythm and normal heart sounds.  No murmur heard. Pulmonary/Chest: Effort normal and breath sounds normal. No respiratory distress. She has no wheezes.  Abdominal: Soft. She exhibits no distension. There is no tenderness. There is no guarding.  Neurological: She is oriented to person, place, and time.  Psychiatric: She has a normal mood and affect. Her behavior is normal. Judgment and thought content normal.  GU: neg CVAT        Assessment & Plan:  UTI- UA + leuks. Will rx with keflex and send urine for culture. Pt is advised to call if new/worsening symptoms or if not improved in 2-3 days.

## 2018-03-25 ENCOUNTER — Encounter: Payer: Self-pay | Admitting: Family

## 2018-03-25 LAB — URINE CULTURE
MICRO NUMBER: 91238229
Result:: NO GROWTH
SPECIMEN QUALITY: ADEQUATE

## 2018-04-13 ENCOUNTER — Other Ambulatory Visit: Payer: Self-pay | Admitting: Family

## 2018-04-15 ENCOUNTER — Ambulatory Visit (HOSPITAL_BASED_OUTPATIENT_CLINIC_OR_DEPARTMENT_OTHER)
Admission: RE | Admit: 2018-04-15 | Discharge: 2018-04-15 | Disposition: A | Discharge status: Home / Self Care | Payer: Medicare Other | Source: Ambulatory Visit | Attending: Medical | Admitting: Medical

## 2018-04-15 ENCOUNTER — Ambulatory Visit (INDEPENDENT_AMBULATORY_CARE_PROVIDER_SITE_OTHER): Payer: Medicare Other | Admitting: Medical

## 2018-04-15 ENCOUNTER — Encounter: Payer: Self-pay | Admitting: Medical

## 2018-04-15 ENCOUNTER — Ambulatory Visit: Payer: Self-pay | Admitting: *Deleted

## 2018-04-15 VITALS — BP 133/70 | HR 78 | Temp 98.2°F | Resp 16 | Ht 66.5 in | Wt 182.4 lb

## 2018-04-15 DIAGNOSIS — R0989 Other specified symptoms and signs involving the circulatory and respiratory systems: Secondary | ICD-10-CM

## 2018-04-15 DIAGNOSIS — I700 Atherosclerosis of aorta: Secondary | ICD-10-CM | POA: Diagnosis not present

## 2018-04-15 DIAGNOSIS — R05 Cough: Secondary | ICD-10-CM | POA: Insufficient documentation

## 2018-04-15 DIAGNOSIS — R059 Cough, unspecified: Secondary | ICD-10-CM

## 2018-04-15 DIAGNOSIS — J40 Bronchitis, not specified as acute or chronic: Secondary | ICD-10-CM | POA: Diagnosis not present

## 2018-04-15 MED ORDER — METHYLPREDNISOLONE ACETATE 40 MG/ML IJ SUSP
40.0000 mg | Freq: Once | INTRAMUSCULAR | Status: AC
  Administered 2018-04-15: 40 mg via INTRAMUSCULAR

## 2018-04-15 MED ORDER — HYDROCODONE-HOMATROPINE 5-1.5 MG/5ML PO SYRP: 5.0000 mL | ORAL_SOLUTION | Freq: Three times a day (TID) | ORAL | 0 refills | Status: DC | PRN

## 2018-04-15 MED ORDER — CEFTRIAXONE SODIUM 1 G IJ SOLR
1.0000 g | Freq: Once | INTRAMUSCULAR | Status: AC
  Administered 2018-04-15: 1 g via INTRAMUSCULAR

## 2018-04-15 MED ORDER — METHYLPREDNISOLONE 4 MG PO TABS: ORAL_TABLET | ORAL | 0 refills | Status: DC

## 2018-04-15 MED FILL — HYDROCODONE-HOMATROPINE SOL: 5-1.5 | 7 days supply | Qty: 100 | Fill #0

## 2018-04-15 NOTE — Progress Notes (Signed)
Subjective:    Patient ID: Patricia Lee, female    DOB: September 29, 1945, 72 y.o.   MRN: [id number]  HPI  Pt in with chest congestion for 8 days. No fever but has had cough that is worsening. Mostly dry cough during the day but then at night cough is productive. Pt tried mucinex and delsym but did not help. She is feeling fatigue. Raw st from coughing so much. She has some wheezing at night. Pt stopped smoking 30 years ago. She smoked for 20 years before stopping.  Rare/occasional bronchitis.  Pt had used norco in past with no side effects.    Review of Systems  Constitutional: Negative for chills, fatigue and fever.  HENT: Positive for congestion. Negative for ear pain, postnasal drip, sinus pressure, sinus pain and sore throat.   Respiratory: Positive for cough and wheezing. Negative for chest tightness and shortness of breath.        Chest congestion..  Cardiovascular: Negative for chest pain and palpitations.  Gastrointestinal: Negative for abdominal pain.  Neurological: Negative for dizziness, weakness and light-headedness.  Hematological: Negative for adenopathy. Does not bruise/bleed easily.  Psychiatric/Behavioral: Negative for confusion and dysphoric mood.    Past Medical History:  Diagnosis Date  . Allergy    spring seasonal  . Arthritis    hip   . 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 Hyperlipidemia 10/21/2016  . Hypertension      Social History   Socioeconomic History  . Marital status: Widowed    Spouse name: Not on file  . Number of children: Not on file  . Years of education: Not on file  . Highest education level: Not on file  Occupational History  . Not on file  Social Needs  . Financial resource strain: Not on file  . Food insecurity:    Worry: Not on file    Inability: Not on file  . Transportation needs:    Medical: Not on file    Non-medical: Not on file  Tobacco Use  . Smoking status: Former Research scientist (life sciences)  . Smokeless tobacco: Never Used  Substance and  Sexual Activity  . Alcohol use: Yes    Alcohol/week: 6.0 standard drinks    Types: 6 Glasses of wine per week    Comment: wine, 1-2 glasses every other day  . Drug use: No  . Sexual activity: Not on file  Lifestyle  . Physical activity:    Days per week: Not on file    Minutes per session: Not on file  . Stress: Not on file  Relationships  . Social connections:    Talks on phone: Not on file    Gets together: Not on file    Attends religious service: Not on file    Active member of club or organization: Not on file    Attends meetings of clubs or organizations: Not on file    Relationship status: Not on file  . Intimate partner violence:    Fear of current or ex partner: Not on file    Emotionally abused: Not on file    Physically abused: Not on file    Forced sexual activity: Not on file  Other Topics Concern  . Not on file  Social History Narrative   2 sons, one in [place] and one in [place].    Retired from [employer]   Enjoys going to the [employer] 3 times a week   Active in her church   Husband died in September 06, 2012  Past Surgical History:  Procedure Laterality Date  . COLONOSCOPY    . NO PAST SURGERIES    . POLYPECTOMY    . 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 Colon cancer Father        died at 26  . Stomach cancer Mother        died at 19  . Colon polyps Neg Hx   . Rectal cancer Neg Hx     Allergies  Allergen Reactions  . Codeine Other (See Comments)    Tachycardia and insomnia  . Erythromycin Other (See Comments)    Tachycardia and insomnia  . Influenza Vaccines Other (See Comments)    Nerve reaction similar to shingles- all nerves in back on fire   . Prednisone Other (See Comments)    Tachycardia and insomnia  . Tetracyclines & Related  Other (See Comments)    Tachycardia/insomnia    Current Outpatient Medications on File Prior to Visit  Medication Sig Dispense Refill  . amLODipine (NORVASC) 5 MG tablet TAKE 1 TABLET BY MOUTH  DAILY 90 tablet 2  . atorvastatin (LIPITOR) 10 MG tablet TAKE 1 TABLET BY MOUTH  DAILY 90 tablet 1  . Calcium Polycarbophil (CONCENTRATED FIBER PO) Take 3 tablets by mouth daily.    . cephALEXin (KEFLEX) 500 MG capsule Take 1 capsule (500 mg total) by mouth 2 (two) times daily. 10 capsule 0  . hydrochlorothiazide (HYDRODIURIL) 25 MG tablet TAKE 1 TABLET BY MOUTH  DAILY 90 tablet 1  . Multiple Vitamins-Minerals (MULTIVITAMIN WITH MINERALS) tablet Take 1 tablet by mouth daily.      . Omega-3 Fatty Acids ([employer]S FISH OIL) 1200 MG CAPS Take 1,200 mg by mouth daily.    Marland Kitchen Propylene Glycol-Glycerin (SOOTHE) 0.6-0.6 % SOLN Apply 2 drops to eye daily as needed (dry eyes).    . Turmeric 500 MG CAPS Take 500 mg by mouth daily.     No current facility-administered medications on file prior to visit.     BP 133/70   Pulse 78   Temp 98.2 F (36.8 C) (Oral)   Resp 16   Ht 5' 6.5" (1.689 m)   Wt 182 lb 6.4 oz (82.7 kg)   SpO2 98%   BMI 29.00 kg/m       Objective:   Physical Exam  General  Mental Status - Alert. General Appearance - Well groomed. Not in acute distress.  Skin Rashes- No Rashes.  HEENT Head- Normal. Ear Auditory Canal - Left- Normal. Right - Normal.Tympanic Membrane- Left- Normal. Right- Normal. Eye Sclera/Conjunctiva- Left- Normal. Right- Normal. Nose & Sinuses Nasal Mucosa- Left-   boggy . Right- boggy Mouth & Throat Lips: Upper Lip- Normal: no dryness, cracking, pallor, cyanosis, or vesicular eruption. Lower Lip-Normal: no dryness, cracking, pallor, cyanosis or vesicular eruption. Buccal Mucosa- Bilateral- No Aphthous ulcers. Oropharynx- No Discharge or Erythema. Tonsils: Characteristics- Bilateral- No Erythema or Congestion. Size/Enlargement- Bilateral- No enlargement.  Discharge- bilateral-None.  Neck Neck- Supple. No Masses.   Chest and Lung Exam Auscultation: Breath Sounds:- even and unlabored, but faint rough breath sound left lower lobe.  Cardiovascular Auscultation:Rythm- Regular, rate and rhythm. Murmurs & Other Heart Sounds:Ausculatation of the heart reveal- No Murmurs.  Lymphatic Head & Neck General Head & Neck Lymphatics: Bilateral: Description- No Localized lymphadenopathy.  Assessment & Plan:  You appear to have bronchitis vs possible pneumonia(left breath sounds in left lower lobe on exam and will see what x-ray read shows). Rest hydrate and tylenol for fever. I am prescribing cough medicine Hycodan, and oral antibiotic.  Will make  decision on oral antibiotic after chest x-ray read back.  Very side effects/allergies to antibiotics listed.  Able to tolerate cephalosporins so we did give Rocephin 1 g IM pending x-ray result.  I do not think you need any medication presently for your reported wheezing at night.  Treating this would be a challenge as you report tachycardia with prednisone in the past and albuterol has known side effect of tachycardia as well.  If you do get constant persistent wheezing then I did make a low-dose Medrol available.  Hopefully this would give you benefit of stopping constant wheezing but avoid side effects.  Follow up in 7-10 days or as needed  MA ordered depomedrol. I advised her not to give. She only gave rocephin.

## 2018-04-15 NOTE — Patient Instructions (Addendum)
You appear to have bronchitis vs possible pneumonia(left breath sounds in left lower lobe on exam and will see what x-ray read shows). Rest hydrate and tylenol for fever. I am prescribing cough medicine Hycodan, and oral antibiotic.  Will make decision on oral antibiotic after chest x-ray read back.  Very side effects/allergies to antibiotics listed.  Able to tolerate cephalosporins so we did give Rocephin 1 g IM pending x-ray result.  I do not think you need any medication presently for your reported wheezing at night.  Treating this would be a challenge as you report tachycardia with prednisone in the past and albuterol has known side effect of tachycardia as well.  If you do get constant persistent wheezing then I did make a low-dose Medrol available.  Hopefully this would give you benefit of stopping constant wheezing  but avoid side effects.  Follow up in 7-10 days or as needed  Reviewed chest x-ray report findings with patient.  Explained that area in question of atelectasis versus early pneumonia is the area that had concern for on auscultation.  Benefits versus risk of antibiotics explained.  Discussed Bactrim DS versus levofloxacin.  I explained to her I thought levofloxacin was the stronger antibiotic in this situation.  Explained the benefits and risk of levofloxacin.  She had been on ciprofloxacin/quinolone before with no prior reaction.  She agreed to use of levofloxacin.

## 2018-04-15 NOTE — Telephone Encounter (Signed)
Pt c/o cough for the last 8 days. Denies fever. Having some chest congestion "feels heavy". The cough is worst when she is lays down. Sounds congested. Unable to cough up any mucous. She is drinking fluids, taking mucinex and delsym, drinking hot tea and honey. Nothing has helped her. She is requesting an appointment for today. Appointment scheduled per pt request. Will route to flow at [facility] at [facility].  Reason for Disposition . [1] Continuous (nonstop) coughing interferes with work or school AND [2] no improvement using cough treatment per protocol  Answer Assessment - Initial Assessment Questions 1. ONSET: "When did the cough begin?"      8 days ago 2. SEVERITY: "How bad is the cough today?"      Worst at night, laying down 3. RESPIRATORY DISTRESS: "Describe your breathing."      Some shortness of breath after getting out of the shower 4. FEVER: "Do you have a fever?" If so, ask: "What is your temperature, how was it measured, and when did it start?"     no 5. HEMOPTYSIS: "Are you coughing up any blood?" If so ask: "How much?" (flecks, streaks, tablespoons, etc.)     no 6. TREATMENT: "What have you done so far to treat the cough?" (e.g., meds, fluids, humidifier)     Humidifier, fluids, medication 7. CARDIAC HISTORY: "Do you have any history of heart disease?" (e.g., heart attack, congestive heart failure)      no 8. LUNG HISTORY: "Do you have any history of lung disease?"  (e.g., pulmonary embolus, asthma, emphysema)     no 9. PE RISK FACTORS: "Do you have a history of blood clots?" (or: recent major surgery, recent prolonged travel, bedridden)     no 10. OTHER SYMPTOMS: "Do you have any other symptoms? (e.g., runny nose, wheezing, chest pain)       Chest congestion, feels heavy 11. PREGNANCY: "Is there any chance you are pregnant?" "When was your last menstrual period?"       n/a 12. TRAVEL: "Have you traveled out of the country in the last month?" (e.g., travel history,  exposures)       no  Protocols used: COUGH - ACUTE NON-PRODUCTIVE-A-AH

## 2018-04-16 ENCOUNTER — Encounter: Payer: Self-pay | Admitting: Medical

## 2018-04-16 ENCOUNTER — Telehealth: Payer: Self-pay | Admitting: Medical

## 2018-04-16 MED ORDER — LEVOFLOXACIN 500 MG PO TABS: 500.0000 mg | ORAL_TABLET | Freq: Every day | ORAL | 0 refills | Status: DC

## 2018-04-16 NOTE — Telephone Encounter (Signed)
rx levofloxin sent to pt pharmacy.

## 2018-04-20 ENCOUNTER — Ambulatory Visit (INDEPENDENT_AMBULATORY_CARE_PROVIDER_SITE_OTHER): Payer: Medicare Other | Admitting: Medical

## 2018-04-20 ENCOUNTER — Encounter: Payer: Self-pay | Admitting: Medical

## 2018-04-20 VITALS — BP 135/80 | HR 90 | Temp 98.4°F | Resp 16 | Ht 66.5 in | Wt 180.0 lb

## 2018-04-20 DIAGNOSIS — R05 Cough: Secondary | ICD-10-CM | POA: Diagnosis not present

## 2018-04-20 DIAGNOSIS — J181 Lobar pneumonia, unspecified organism: Secondary | ICD-10-CM | POA: Diagnosis not present

## 2018-04-20 DIAGNOSIS — R059 Cough, unspecified: Secondary | ICD-10-CM

## 2018-04-20 DIAGNOSIS — J189 Pneumonia, unspecified organism: Secondary | ICD-10-CM

## 2018-04-20 MED ORDER — IPRATROPIUM BROMIDE HFA 17 MCG/ACT IN AERS: 2.0000 | INHALATION_SPRAY | Freq: Four times a day (QID) | RESPIRATORY_TRACT | 0 refills | Status: DC | PRN

## 2018-04-20 MED ORDER — CEFTRIAXONE SODIUM 1 G IJ SOLR
1.0000 g | Freq: Once | INTRAMUSCULAR | Status: AC
  Administered 2018-04-20: 1 g via INTRAMUSCULAR

## 2018-04-20 MED FILL — METHYLPREDNISOLONE 4 MG TAB: 4 | 4 days supply | Qty: 10 | Fill #0

## 2018-04-20 NOTE — Progress Notes (Signed)
Subjective:    Patient ID: Patricia Lee, female    DOB: 1946/04/05, 72 y.o.   MRN: [id number]  HPI  Pt in for some persisting cough. She can sleep with cough medicine. Pt not bringing up mucus. Pt had possible early pneumonia on cxr vs atelectasis. She still has some wheezing intermittently. She is no longer hearing wheezing from lower lobe area. More just from mid chest.  No fever, no chills and no sweats.    Review of Systems  Constitutional: Negative for chills, fatigue and fever.  HENT: Positive for congestion.   Respiratory: Positive for cough and wheezing. Negative for shortness of breath.   Cardiovascular: Negative for chest pain and palpitations.  Gastrointestinal: Negative for abdominal pain, blood in stool and constipation.  Musculoskeletal: Negative for back pain.  Skin: Negative for rash.  Neurological: Negative for dizziness, speech difficulty, weakness and light-headedness.  Hematological: Negative for adenopathy. Does not bruise/bleed easily.  Psychiatric/Behavioral: Negative for behavioral problems, confusion and suicidal ideas. The patient is not nervous/anxious.     Past Medical History:  Diagnosis Date  . Allergy    spring seasonal  . Arthritis    hip   . 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 Hyperlipidemia 10/21/2016  . Hypertension      Social History   Socioeconomic History  . Marital status: Widowed    Spouse name: Not on file  . Number of children: Not on file  . Years of education: Not on file  . Highest education level: Not on file  Occupational History  . Not on file  Social Needs  . Financial resource strain: Not on file  . Food insecurity:    Worry: Not on file    Inability: Not on file  . Transportation needs:    Medical: Not on file    Non-medical: Not on file  Tobacco Use  . Smoking status: Former Research scientist (life sciences)  . Smokeless tobacco: Never Used  Substance and Sexual Activity  . Alcohol use: Yes    Alcohol/week: 6.0 standard drinks   Types: 6 Glasses of wine per week    Comment: wine, 1-2 glasses every other day  . Drug use: No  . Sexual activity: Not on file  Lifestyle  . Physical activity:    Days per week: Not on file    Minutes per session: Not on file  . Stress: Not on file  Relationships  . Social connections:    Talks on phone: Not on file    Gets together: Not on file    Attends religious service: Not on file    Active member of club or organization: Not on file    Attends meetings of clubs or organizations: Not on file    Relationship status: Not on file  . Intimate partner violence:    Fear of current or ex partner: Not on file    Emotionally abused: Not on file    Physically abused: Not on file    Forced sexual activity: Not on file  Other Topics Concern  . Not on file  Social History Narrative   2 sons, one in [place] and one in [place].    Retired from [employer]   Enjoys going to the Y 3 times a week   Active in her church   Husband died in 09/18/12    Past Surgical History:  Procedure Laterality Date  . COLONOSCOPY    . NO PAST SURGERIES    . POLYPECTOMY    .  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 Colon cancer Father        died at 65  . Stomach cancer Mother        died at 58  . Colon polyps Neg Hx   . Rectal cancer Neg Hx     Allergies  Allergen Reactions  . Codeine Other (See Comments)    Tachycardia and insomnia  . Erythromycin Other (See Comments)    Tachycardia and insomnia  . Influenza Vaccines Other (See Comments)    Nerve reaction similar to shingles- all nerves in back on fire   . Prednisone Other (See Comments)    Tachycardia and insomnia  . Tetracyclines & Related Other (See Comments)    Tachycardia/insomnia    Current Outpatient Medications  on File Prior to Visit  Medication Sig Dispense Refill  . amLODipine (NORVASC) 5 MG tablet TAKE 1 TABLET BY MOUTH  DAILY 90 tablet 2  . atorvastatin (LIPITOR) 10 MG tablet TAKE 1 TABLET BY MOUTH  DAILY 90 tablet 1  . Calcium Polycarbophil (CONCENTRATED FIBER PO) Take 3 tablets by mouth daily.    . cephALEXin (KEFLEX) 500 MG capsule Take 1 capsule (500 mg total) by mouth 2 (two) times daily. 10 capsule 0  . hydrochlorothiazide (HYDRODIURIL) 25 MG tablet TAKE 1 TABLET BY MOUTH  DAILY 90 tablet 1  . HYDROcodone-homatropine (HYCODAN) 5-1.5 MG/5ML syrup Take 5 mLs by mouth every 8 (eight) hours as needed. 100 mL 0  . levofloxacin (LEVAQUIN) 500 MG tablet Take 1 tablet (500 mg total) by mouth daily. 7 tablet 0  . methylPREDNISolone (MEDROL) 4 MG tablet 4 tab po day 1 3 tab po day 2  2 tab po day 3 1 tab po day 4. 10 tablet 0  . Multiple Vitamins-Minerals (MULTIVITAMIN WITH MINERALS) tablet Take 1 tablet by mouth daily.      . Omega-3 Fatty Acids ([employer] FISH OIL) 1200 MG CAPS Take 1,200 mg by mouth daily.    Marland Kitchen Propylene Glycol-Glycerin (SOOTHE) 0.6-0.6 % SOLN Apply 2 drops to eye daily as needed (dry eyes).    . Turmeric 500 MG CAPS Take 500 mg by mouth daily.     No current facility-administered medications on file prior to visit.     BP 135/80   Pulse 90   Temp 98.4 F (36.9 C) (Oral)   Resp 16   Ht 5' 6.5" (1.689 m)   Wt 180 lb (81.6 kg)   SpO2 99%   BMI 28.62 kg/m       Objective:   Physical Exam  General  Mental Status - Alert. General Appearance - Well groomed. Not in acute distress.  Skin Rashes- No Rashes.  HEENT Head- Normal. Ear Auditory Canal - Left- Normal. Right - Normal.Tympanic Membrane- Left- Normal. Right- Normal. Eye Sclera/Conjunctiva- Left- Normal. Right- Normal. Nose & Sinuses Nasal Mucosa- Left-  Boggy and Congested. Right-  Boggy and  Congested.Bilateral maxillary and frontal sinus pressure. Mouth & Throat Lips: Upper Lip- Normal: no dryness, cracking,  pallor, cyanosis, or vesicular eruption. Lower Lip-Normal: no dryness, cracking, pallor, cyanosis or vesicular eruption. Buccal Mucosa- Bilateral- No Aphthous ulcers. Oropharynx- No Discharge or Erythema. Tonsils: Characteristics- Bilateral- No Erythema or Congestion. Size/Enlargement- Bilateral- No enlargement. Discharge- bilateral-None.  Neck Neck- Supple. No Masses.   Chest  and Lung Exam Auscultation: Breath Sounds:-clear even and unlabored. But moderate left lower lobe rough breath sounds again.  Cardiovascular Auscultation:Rythm- Regular, rate and rhythm. Murmurs & Other Heart Sounds:Ausculatation of the heart reveal- No Murmurs.  Lymphatic Head & Neck General Head & Neck Lymphatics: Bilateral: Description- No Localized lymphadenopathy.       Assessment & Plan:  Clinically and by x-ray you appear to  have persisting/slow resolving left lower lobe pneumonia.  You are on levofloxacin and that is a very good antibiotic.  Would recommend that you continue with levofloxacin and we gave you Rocephin 1 g IM injection today.  You do have history of smoking remotely and some wheezing recently as well.  I do think we should go ahead and start the Medrol taper dose pack.  Unfortunately you have tachycardia sensation with the prednisone in the past so I felt like had to give lower reduced dosing.  Will also prescribe Atrovent.  This medication might help with the breathing/wheezing.  Not had side effects of tachycardia which albuterol could potentially cause.  Continue other cough medication.  Update [place] on Thursday afternoon how you are feeling.  If not much improved what at that point get repeat chest x-ray.  Decide if switching antibiotics would be appropriate and decide if higher dose prednisone  or injection echo Medrol would be appropriate.  Follow-up on Thursday or as needed.(explained can call or give my chart update.) May need cxr on Friday depending on how doing  [employer],  [employer]

## 2018-04-20 NOTE — Patient Instructions (Addendum)
Clinically and by x-ray you appear to  have persisting/slow resolving left lower lobe pneumonia.  You are on levofloxacin and that is a very good antibiotic.  Would recommend that you continue with levofloxacin and we gave you Rocephin 1 g IM injection today.  You do have history of smoking remotely and some wheezing recently as well.  I do think we should go ahead and start the Medrol taper dose pack.  Unfortunately you have tachycardia sensation with the prednisone in the past so I felt like had to give lower reduced dosing.  Will also prescribe Atrovent.  This medication might help with the breathing/wheezing.  Not had side effects of tachycardia which albuterol could potentially cause.  Continue other cough medication.  Update [place] on Thursday afternoon how you are feeling.  If not much improved what at that point get repeat chest x-ray.  Decide if switching antibiotics would be appropriate and decide if higher dose prednisone  or injection echo Medrol would be appropriate.  Follow-up on Thursday or as needed.

## 2018-04-23 ENCOUNTER — Encounter: Payer: Self-pay | Admitting: Medical

## 2018-04-23 ENCOUNTER — Telehealth: Payer: Self-pay | Admitting: Medical

## 2018-04-23 DIAGNOSIS — J189 Pneumonia, unspecified organism: Secondary | ICD-10-CM

## 2018-04-23 DIAGNOSIS — J181 Lobar pneumonia, unspecified organism: Principal | ICD-10-CM

## 2018-04-23 MED ORDER — LEVOFLOXACIN 500 MG PO TABS: 500.0000 mg | ORAL_TABLET | Freq: Every day | ORAL | 0 refills | Status: DC

## 2018-04-23 NOTE — Telephone Encounter (Signed)
cxr ordered

## 2018-04-23 NOTE — Telephone Encounter (Signed)
Rx levofloxin sent to pt pharmacy.

## 2018-04-30 ENCOUNTER — Ambulatory Visit (HOSPITAL_BASED_OUTPATIENT_CLINIC_OR_DEPARTMENT_OTHER)
Admission: RE | Admit: 2018-04-30 | Discharge: 2018-04-30 | Disposition: A | Discharge status: Home / Self Care | Payer: Medicare Other | Source: Ambulatory Visit | Attending: Medical | Admitting: Medical

## 2018-04-30 DIAGNOSIS — R918 Other nonspecific abnormal finding of lung field: Secondary | ICD-10-CM | POA: Diagnosis not present

## 2018-04-30 DIAGNOSIS — J181 Lobar pneumonia, unspecified organism: Secondary | ICD-10-CM

## 2018-04-30 DIAGNOSIS — Z09 Encounter for follow-up examination after completed treatment for conditions other than malignant neoplasm: Secondary | ICD-10-CM | POA: Insufficient documentation

## 2018-04-30 DIAGNOSIS — Z8701 Personal history of pneumonia (recurrent): Secondary | ICD-10-CM | POA: Insufficient documentation

## 2018-04-30 DIAGNOSIS — J189 Pneumonia, unspecified organism: Secondary | ICD-10-CM

## 2018-06-29 ENCOUNTER — Encounter: Payer: Self-pay | Admitting: Family

## 2018-06-29 ENCOUNTER — Ambulatory Visit (INDEPENDENT_AMBULATORY_CARE_PROVIDER_SITE_OTHER): Payer: Medicare Other | Admitting: Family

## 2018-06-29 ENCOUNTER — Ambulatory Visit (HOSPITAL_BASED_OUTPATIENT_CLINIC_OR_DEPARTMENT_OTHER)
Admission: RE | Admit: 2018-06-29 | Discharge: 2018-06-29 | Disposition: A | Discharge status: Home / Self Care | Payer: Medicare Other | Source: Ambulatory Visit | Attending: Family | Admitting: Family

## 2018-06-29 VITALS — BP 148/67 | HR 80 | Temp 98.2°F | Resp 16 | Ht 66.5 in | Wt 183.0 lb

## 2018-06-29 DIAGNOSIS — R05 Cough: Secondary | ICD-10-CM

## 2018-06-29 DIAGNOSIS — M542 Cervicalgia: Secondary | ICD-10-CM

## 2018-06-29 DIAGNOSIS — R059 Cough, unspecified: Secondary | ICD-10-CM

## 2018-06-29 MED ORDER — MELOXICAM 7.5 MG PO TABS: 7.5000 mg | ORAL_TABLET | Freq: Every day | ORAL | 0 refills | Status: DC

## 2018-06-29 MED FILL — MELOXICAM 7.5 MG TABLET: 7.5 | 14 days supply | Qty: 14 | Fill #0

## 2018-06-29 NOTE — Patient Instructions (Signed)
Start meloxicam (anti-inflammatory) once daily.  Chest x-ray first floor. Call or mychart if symptoms worsen or if not improved in 1-2 weeks.

## 2018-06-29 NOTE — Progress Notes (Signed)
Subjective:    Patient ID: Patricia Lee, female    DOB: Dec 02, 1945, 73 y.o.   MRN: [id number]  HPI  Patient is a 73 yr old female who presents today with several complaints.   Neck pain- reports tightness in the back of her neck. Has used ibuprofen, biofreeze, and acetaminophen without much help.  Though ibuprofen helped more than tylenol. Reports + stiffness and pain with turning her head. Denies radicular pain in the arms, weakness or numbness.   Cough- has been present since she was diagnosed with pneumonia in November.   Review of Systems See HPI  Past Medical History:  Diagnosis Date  . Allergy    spring seasonal  . Arthritis    hip   . 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 Hyperlipidemia 10/21/2016  . Hypertension      Social History   Socioeconomic History  . Marital status: Widowed    Spouse name: Not on file  . Number of children: Not on file  . Years of education: Not on file  . Highest education level: Not on file  Occupational History  . Not on file  Social Needs  . Financial resource strain: Not on file  . Food insecurity:    Worry: Not on file    Inability: Not on file  . Transportation needs:    Medical: Not on file    Non-medical: Not on file  Tobacco Use  . Smoking status: Former Research scientist (life sciences)  . Smokeless tobacco: Never Used  Substance and Sexual Activity  . Alcohol use: Yes    Alcohol/week: 6.0 standard drinks    Types: 6 Glasses of wine per week    Comment: wine, 1-2 glasses every other day  . Drug use: No  . Sexual activity: Not on file  Lifestyle  . Physical activity:    Days per week: Not on file    Minutes per session: Not on file  . Stress: Not on file  Relationships  . Social connections:    Talks on phone: Not on file    Gets together: Not on file    Attends religious service: Not on file    Active member of club or organization: Not on file    Attends meetings of clubs or organizations: Not on file    Relationship status: Not on file    . Intimate partner violence:    Fear of current or ex partner: Not on file    Emotionally abused: Not on file    Physically abused: Not on file    Forced sexual activity: Not on file  Other Topics Concern  . Not on file  Social History Narrative   2 sons, one in [place] and one in [place].    Retired from [employer]   Enjoys going to the Y 3 times a week   Active in her church   Husband died in 09/10/2012    Past Surgical History:  Procedure Laterality Date  . COLONOSCOPY    . NO PAST SURGERIES    . POLYPECTOMY    . 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 Colon cancer Father        died at 2  . Stomach cancer Mother        died at 16  .  Colon polyps Neg Hx   . Rectal cancer Neg Hx     Allergies  Allergen Reactions  . Codeine Other (See Comments)    Tachycardia and insomnia  . Erythromycin Other (See Comments)    Tachycardia and insomnia  . Influenza Vaccines Other (See Comments)    Nerve reaction similar to shingles- all nerves in back on fire   . Prednisone Other (See Comments)    Tachycardia and insomnia  . Tetracyclines & Related Other (See Comments)    Tachycardia/insomnia    Current Outpatient Medications on File Prior to Visit  Medication Sig Dispense Refill  . amLODipine (NORVASC) 5 MG tablet TAKE 1 TABLET BY MOUTH  DAILY 90 tablet 2  . atorvastatin (LIPITOR) 10 MG tablet TAKE 1 TABLET BY MOUTH  DAILY 90 tablet 1  . Calcium Polycarbophil (CONCENTRATED FIBER PO) Take 3 tablets by mouth daily.    . hydrochlorothiazide (HYDRODIURIL) 25 MG tablet TAKE 1 TABLET BY MOUTH  DAILY 90 tablet 1  . HYDROcodone-homatropine (HYCODAN) 5-1.5 MG/5ML syrup Take 5 mLs by mouth every 8 (eight) hours as needed. 100 mL 0  . ipratropium (ATROVENT HFA) 17 MCG/ACT  inhaler Inhale 2 puffs into the lungs every 6 (six) hours as needed for wheezing. 1 Inhaler 0  . methylPREDNISolone (MEDROL) 4 MG tablet 4 tab po day 1 3 tab po day 2  2 tab po day 3 1 tab po day 4. 10 tablet 0  . Multiple Vitamins-Minerals (MULTIVITAMIN WITH MINERALS) tablet Take 1 tablet by mouth daily.      . Omega-3 Fatty Acids ([employer] FISH OIL) 1200 MG CAPS Take 1,200 mg by mouth daily.    Marland Kitchen Propylene Glycol-Glycerin (SOOTHE) 0.6-0.6 % SOLN Apply 2 drops to eye daily as needed (dry eyes).    . Turmeric 500 MG CAPS Take 500 mg by mouth daily.     No current facility-administered medications on file prior to visit.     BP (!) 148/67 (BP Location: Right Arm, Patient Position: Sitting, Cuff Size: Small)   Pulse 80   Temp 98.2 F (36.8 C) (Oral)   Resp 16   Ht 5' 6.5" (1.689 m)   Wt 183 lb (83 kg)   SpO2 100%   BMI 29.09 kg/m       Objective:   Physical Exam Constitutional:      Appearance: She is well-developed.  Neck:     Musculoskeletal: Neck supple.     Thyroid: No thyromegaly.  Cardiovascular:     Rate and Rhythm: Normal rate and regular rhythm.     Heart sounds: Normal heart sounds. No murmur.  Pulmonary:     Effort: Pulmonary effort is normal. No respiratory distress.     Breath sounds: Normal breath sounds. No wheezing.  Musculoskeletal:     Comments: + tenderness to palpation of posterior neck.   Skin:    General: Skin is warm and dry.  Neurological:     Mental Status: She is alert and oriented to person, place, and time.     Comments: Bilateral UE/LE strength is 5/5 Strong equal hand grasp.   Psychiatric:        Behavior: Behavior normal.        Thought Content: Thought content normal.        Judgment: Judgment normal.           Assessment & Plan:  Neck pain- trial of meloxicam once daily. If pain worsens, numbness/radicular symptoms or if not improved in 1-2 weeks  pt is advise to contact us.  Cough- needs follow up CXR to ensure resolution of her  PNA.  If CXR negative and cough dose not improve in the next 1-2 weeks pt is advised to contact us.

## 2018-07-21 ENCOUNTER — Ambulatory Visit: Payer: Medicare Other | Admitting: Family

## 2018-07-22 ENCOUNTER — Ambulatory Visit (INDEPENDENT_AMBULATORY_CARE_PROVIDER_SITE_OTHER): Payer: Medicare Other | Admitting: Family

## 2018-07-22 VITALS — BP 132/73 | HR 61 | Temp 98.3°F | Resp 16 | Ht 67.0 in | Wt 180.0 lb

## 2018-07-22 DIAGNOSIS — R739 Hyperglycemia, unspecified: Secondary | ICD-10-CM

## 2018-07-22 DIAGNOSIS — E785 Hyperlipidemia, unspecified: Secondary | ICD-10-CM | POA: Diagnosis not present

## 2018-07-22 DIAGNOSIS — I10 Essential (primary) hypertension: Secondary | ICD-10-CM | POA: Diagnosis not present

## 2018-07-22 LAB — BASIC METABOLIC PANEL
BUN: 18 mg/dL (ref 6–23)
CHLORIDE: 102 meq/L (ref 96–112)
CO2: 29 mEq/L (ref 19–32)
Calcium: 10.3 mg/dL (ref 8.4–10.5)
Creatinine, Ser: 1.01 mg/dL (ref 0.40–1.20)
GFR: 53.72 mL/min — AB (ref >60.00)
GLUCOSE: 95 mg/dL (ref 70–99)
POTASSIUM: 5.5 meq/L — AB (ref 3.5–5.1)
Sodium: 141 mEq/L (ref 135–145)

## 2018-07-22 LAB — HEMOGLOBIN A1C: HEMOGLOBIN A1C: 6.1 % (ref 4.6–6.5)

## 2018-07-22 NOTE — Patient Instructions (Signed)
Please complete lab work prior to leaving.   

## 2018-07-22 NOTE — Progress Notes (Signed)
Subjective:    Patient ID: Patricia Lee, female    DOB: 03/25/1946, 74 y.o.   MRN: [id number]  HPI  Patient is a 73 year old female who presents today for routine follow-up.  Hypertension- patient is maintained on hydrochlorothiazide 25 mg once daily and amlodipine 5 mg daily. She denies swelling or sob.   BP Readings from Last 3 Encounters:  07/22/18 132/73  06/29/18 (!) 148/67  04/20/18 135/80   Hyperlipidemia- patient is maintained on Lipitor 10 mg once daily. Watching her diet.  Denies myalgia.   Lab Results  Component Value Date   CHOL 182 01/13/2018   HDL 66.60 01/13/2018   LDLCALC 90 01/13/2018   LDLDIRECT 136.2 07/21/2012   TRIG 126.0 01/13/2018   CHOLHDL 3 01/13/2018   Borderline diabetes- patient is treated with diet alone. She reports regular exercise and good compliance with diet for the most part.   Lab Results  Component Value Date   HGBA1C 6.2 01/13/2018      Review of Systems See HPI  Past Medical History:  Diagnosis Date  . Allergy    spring seasonal  . Arthritis    hip   . 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 Hyperlipidemia 10/21/2016  . Hypertension      Social History   Socioeconomic History  . Marital status: Widowed    Spouse name: Not on file  . Number of children: Not on file  . Years of education: Not on file  . Highest education level: Not on file  Occupational History  . Not on file  Social Needs  . Financial resource strain: Not on file  . Food insecurity:    Worry: Not on file    Inability: Not on file  . Transportation needs:    Medical: Not on file    Non-medical: Not on file  Tobacco Use  . Smoking status: Former Research scientist (life sciences)  . Smokeless tobacco: Never Used  Substance and Sexual Activity  . Alcohol use: Yes    Alcohol/week: 6.0 standard drinks    Types: 6 Glasses of wine per week    Comment: wine, 1-2 glasses every other day  . Drug use: No  . Sexual activity: Not on file  Lifestyle  . Physical activity:    Days  per week: Not on file    Minutes per session: Not on file  . Stress: Not on file  Relationships  . Social connections:    Talks on phone: Not on file    Gets together: Not on file    Attends religious service: Not on file    Active member of club or organization: Not on file    Attends meetings of clubs or organizations: Not on file    Relationship status: Not on file  . Intimate partner violence:    Fear of current or ex partner: Not on file    Emotionally abused: Not on file    Physically abused: Not on file    Forced sexual activity: Not on file  Other Topics Concern  . Not on file  Social History Narrative   2 sons, one in [place] and one in [place].    Retired from [employer]   Enjoys going to the Y 3 times a week   Active in her church   Husband died in 09-22-12    Past Surgical History:  Procedure Laterality Date  . COLONOSCOPY    . NO PAST SURGERIES    . POLYPECTOMY    .  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 Colon cancer Father        died at 46  . Stomach cancer Mother        died at 54  . Colon polyps Neg Hx   . Rectal cancer Neg Hx     Allergies  Allergen Reactions  . Codeine Other (See Comments)    Tachycardia and insomnia  . Erythromycin Other (See Comments)    Tachycardia and insomnia  . Influenza Vaccines Other (See Comments)    Nerve reaction similar to shingles- all nerves in back on fire   . Prednisone Other (See Comments)    Tachycardia and insomnia  . Tetracyclines & Related Other (See Comments)    Tachycardia/insomnia    Current Outpatient Medications on File Prior to Visit  Medication Sig Dispense Refill  . amLODipine (NORVASC) 5 MG tablet TAKE 1 TABLET BY MOUTH  DAILY 90 tablet 2  . atorvastatin (LIPITOR) 10 MG tablet  TAKE 1 TABLET BY MOUTH  DAILY 90 tablet 1  . Calcium Polycarbophil (CONCENTRATED FIBER PO) Take 3 tablets by mouth daily.    . hydrochlorothiazide (HYDRODIURIL) 25 MG tablet TAKE 1 TABLET BY MOUTH  DAILY 90 tablet 1  . HYDROcodone-homatropine (HYCODAN) 5-1.5 MG/5ML syrup Take 5 mLs by mouth every 8 (eight) hours as needed. 100 mL 0  . ipratropium (ATROVENT HFA) 17 MCG/ACT inhaler Inhale 2 puffs into the lungs every 6 (six) hours as needed for wheezing. 1 Inhaler 0  . meloxicam (MOBIC) 7.5 MG tablet Take 1 tablet (7.5 mg total) by mouth daily. 14 tablet 0  . methylPREDNISolone (MEDROL) 4 MG tablet 4 tab po day 1 3 tab po day 2  2 tab po day 3 1 tab po day 4. 10 tablet 0  . Multiple Vitamins-Minerals (MULTIVITAMIN WITH MINERALS) tablet Take 1 tablet by mouth daily.      . Omega-3 Fatty Acids ([employer]S FISH OIL) 1200 MG CAPS Take 1,200 mg by mouth daily.    Marland Kitchen Propylene Glycol-Glycerin (SOOTHE) 0.6-0.6 % SOLN Apply 2 drops to eye daily as needed (dry eyes).    . Turmeric 500 MG CAPS Take 500 mg by mouth daily.     No current facility-administered medications on file prior to visit.     BP 132/73 (BP Location: Right Arm, Patient Position: Sitting, Cuff Size: Small)   Pulse 61   Temp 98.3 F (36.8 C) (Oral)   Resp 16   Ht 5\' 7"  (1.702 m)   Wt 180 lb (81.6 kg)   SpO2 99%   BMI 28.19 kg/m       Objective:   Physical Exam Constitutional:      Appearance: She is well-developed.  Neck:     Musculoskeletal: Neck supple.     Thyroid: No thyromegaly.  Cardiovascular:     Rate and Rhythm: Normal rate and regular rhythm.     Heart sounds: Normal heart sounds. No murmur.  Pulmonary:     Effort: Pulmonary effort is normal. No respiratory distress.     Breath sounds: Normal breath sounds. No wheezing.  Skin:    General: Skin is warm and dry.  Neurological:     Mental Status: She is alert and oriented to person, place, and time.  Psychiatric:  Behavior: Behavior normal.        Thought  Content: Thought content normal.        Judgment: Judgment normal.           Assessment & Plan:  HTN- bp stable on current meds. Continue same.  Hyperglycemia- reinforced low glycemic diet. Obtain A1C.   Hyperlipidemia- tolerating statin, lipids at goal. Continue same.

## 2018-07-23 ENCOUNTER — Telehealth: Payer: Self-pay | Admitting: Family

## 2018-07-23 NOTE — Telephone Encounter (Signed)
Potassium is elevated.  Please ask pt if she is taking any otc supplements that may contain potassium. If so please d/c. Also, I would like her to return the lab for repeat bmet. Dx hyperkalemia.

## 2018-07-24 ENCOUNTER — Other Ambulatory Visit: Payer: Self-pay

## 2018-07-24 DIAGNOSIS — E875 Hyperkalemia: Secondary | ICD-10-CM

## 2018-07-24 NOTE — Telephone Encounter (Signed)
Results given to patient, she will check her multivitamin to see if it contains Potassium. She reports she has been eating more salads advised to decrease green leafy vegetables. She will come back 08-03-18 for bmet.

## 2018-08-03 ENCOUNTER — Other Ambulatory Visit (INDEPENDENT_AMBULATORY_CARE_PROVIDER_SITE_OTHER): Payer: Medicare Other

## 2018-08-03 DIAGNOSIS — E875 Hyperkalemia: Secondary | ICD-10-CM | POA: Diagnosis not present

## 2018-08-03 LAB — BASIC METABOLIC PANEL
BUN: 22 mg/dL (ref 6–23)
CO2: 30 meq/L (ref 19–32)
Calcium: 9.8 mg/dL (ref 8.4–10.5)
Chloride: 101 mEq/L (ref 96–112)
Creatinine, Ser: 0.98 mg/dL (ref 0.40–1.20)
GFR: 55.62 mL/min — AB (ref >60.00)
GLUCOSE: 84 mg/dL (ref 70–99)
POTASSIUM: 5.3 meq/L — AB (ref 3.5–5.1)
SODIUM: 140 meq/L (ref 135–145)

## 2018-08-04 ENCOUNTER — Telehealth: Payer: Self-pay | Admitting: Family

## 2018-08-04 NOTE — Telephone Encounter (Signed)
Please call pt and let her know that her potassium is slightly improved but still elevated. I would like her to stop hctz, start a similar fluid pill called chlorthalidone. (new med lowers potassium a bit more). Let's bring her back in 2 weeks for nurse visit bp check and follow up bmet)

## 2018-08-06 NOTE — Telephone Encounter (Signed)
Lm for patient to call back about results and instructions.

## 2018-08-07 ENCOUNTER — Other Ambulatory Visit: Payer: Self-pay

## 2018-08-07 DIAGNOSIS — E875 Hyperkalemia: Secondary | ICD-10-CM

## 2018-08-07 MED ORDER — CHLORTHALIDONE 25 MG PO TABS: 25.0000 mg | ORAL_TABLET | Freq: Every day | ORAL | 3 refills | Status: DC

## 2018-08-07 MED FILL — CHLORTHALIDONE 25 MG TABS: 25 | 30 days supply | Qty: 30 | Fill #0

## 2018-08-13 NOTE — Telephone Encounter (Signed)
Please call patient again

## 2018-08-14 ENCOUNTER — Ambulatory Visit: Payer: Self-pay | Admitting: Family

## 2018-08-14 NOTE — Telephone Encounter (Signed)
Talked to patient she will be here 3-17 for nv bp check and bmet.

## 2018-08-14 NOTE — Telephone Encounter (Signed)
Lm at home and cell but no answer. Lm on both for her to call me asap. MyChart message also sent.

## 2018-08-25 ENCOUNTER — Other Ambulatory Visit: Payer: Self-pay

## 2018-08-25 ENCOUNTER — Encounter: Payer: Self-pay | Admitting: Family

## 2018-08-25 ENCOUNTER — Ambulatory Visit (INDEPENDENT_AMBULATORY_CARE_PROVIDER_SITE_OTHER): Payer: Medicare Other | Admitting: Family

## 2018-08-25 ENCOUNTER — Other Ambulatory Visit (INDEPENDENT_AMBULATORY_CARE_PROVIDER_SITE_OTHER): Payer: Medicare Other

## 2018-08-25 VITALS — BP 145/81 | HR 59

## 2018-08-25 DIAGNOSIS — E875 Hyperkalemia: Secondary | ICD-10-CM

## 2018-08-25 DIAGNOSIS — I10 Essential (primary) hypertension: Secondary | ICD-10-CM

## 2018-08-25 LAB — BASIC METABOLIC PANEL
BUN: 23 mg/dL (ref 6–23)
CALCIUM: 9.7 mg/dL (ref 8.4–10.5)
CO2: 32 mEq/L (ref 19–32)
CREATININE: 1.01 mg/dL (ref 0.40–1.20)
Chloride: 99 mEq/L (ref 96–112)
GFR: 53.71 mL/min — AB (ref >60.00)
GLUCOSE: 116 mg/dL — AB (ref 70–99)
POTASSIUM: 4.1 meq/L (ref 3.5–5.1)
Sodium: 138 mEq/L (ref 135–145)

## 2018-08-25 MED ORDER — CHLORTHALIDONE 25 MG PO TABS: 25.0000 mg | ORAL_TABLET | Freq: Every day | ORAL | 1 refills | Status: DC

## 2018-08-25 NOTE — Progress Notes (Signed)
LPN note reviewed and agree.

## 2018-08-25 NOTE — Progress Notes (Signed)
Pre visit review using our clinic tool,if applicable. No additional management support is needed unless otherwise documented below in the visit note.   Pt here for Blood pressure check per order from Earlie Counts NP.  Pt currently takes: Chlorthalidone 25 mg qd and Amlodipine 5 mg qd.   Pt reports compliance with medication.  BP today  = 145/81 HR = 66  Pt advised per M. Inda Castle continue medication as order. Have BMP collected today as ordered previously.  Patient to schedule follow up appointment with provider for 6 weeks.

## 2018-08-26 ENCOUNTER — Other Ambulatory Visit: Payer: Self-pay | Admitting: Family

## 2018-10-06 ENCOUNTER — Ambulatory Visit: Payer: Medicare Other | Admitting: Family

## 2018-10-10 IMAGING — DX DG HIP (WITH OR WITHOUT PELVIS) 1V PORT*R*
2 series · 2 of 2 positions shown · non-contrast
Comparison: No recent prior .

CLINICAL DATA: Hip replacement.

EXAM:
DG HIP (WITH OR WITHOUT PELVIS) 1V PORT RIGHT

[pelvis ap]
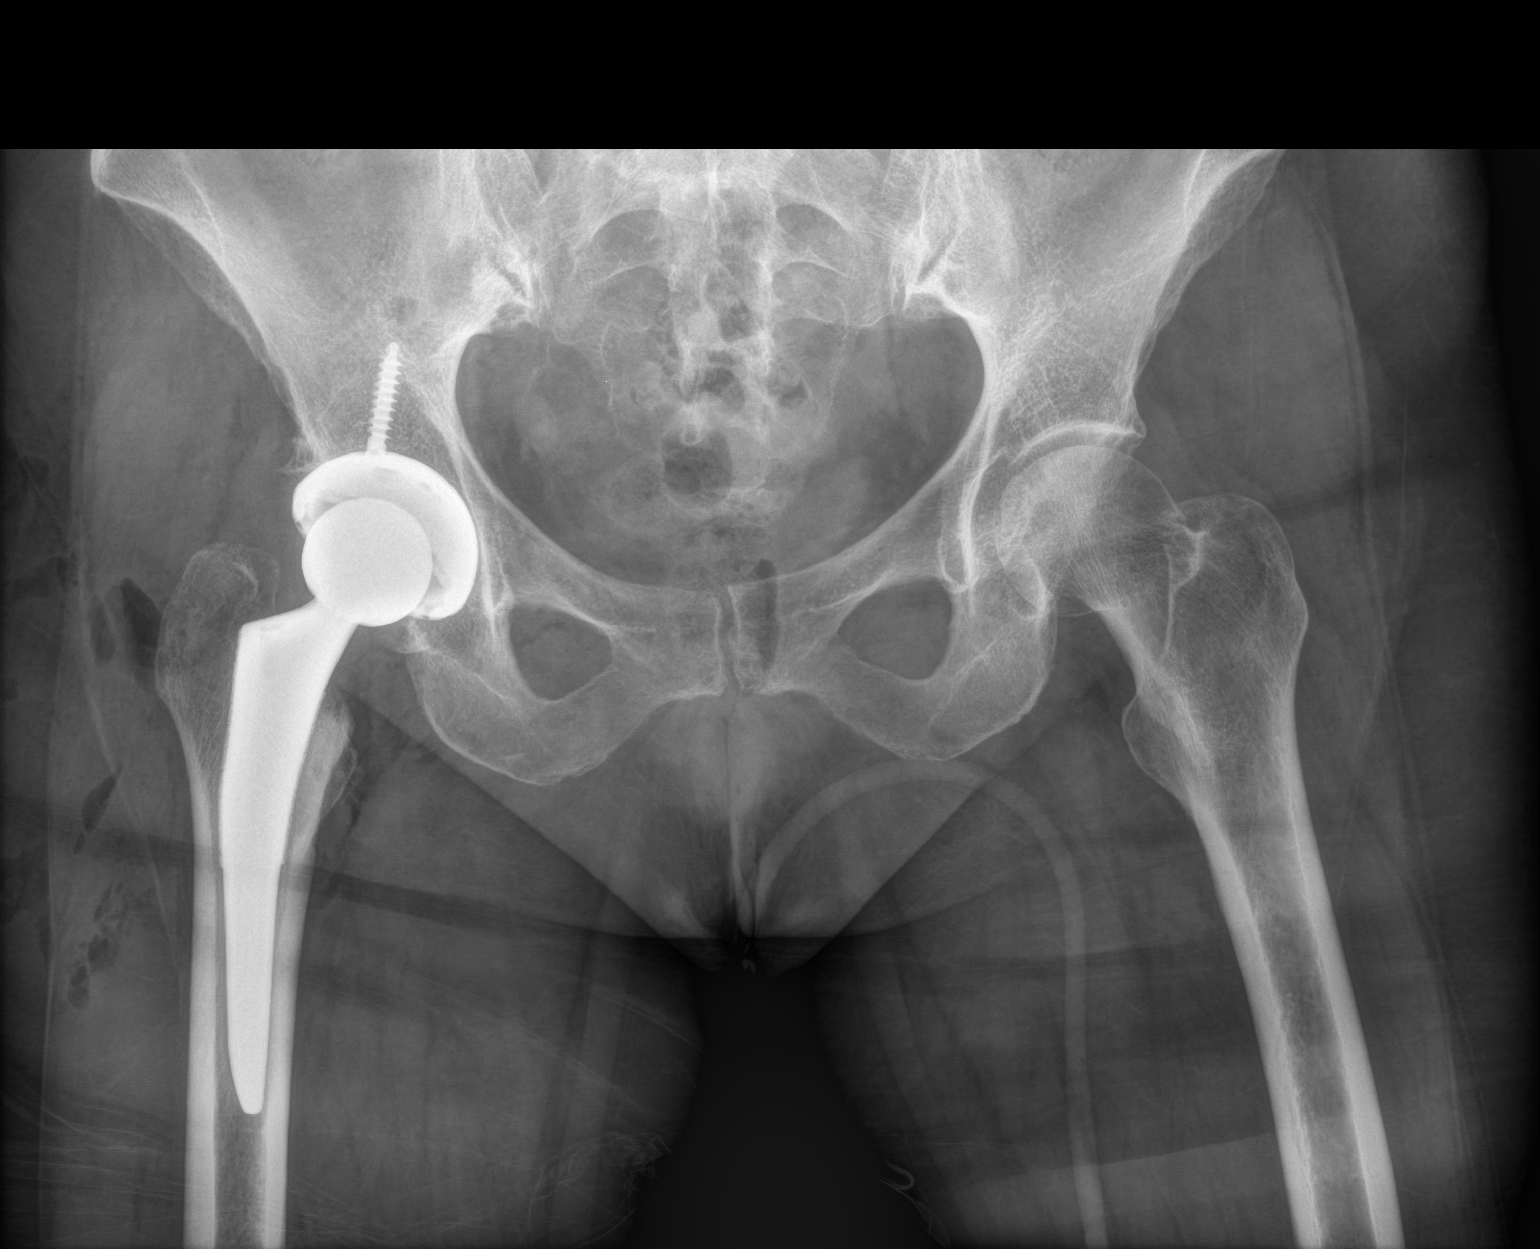

[hip x-table]
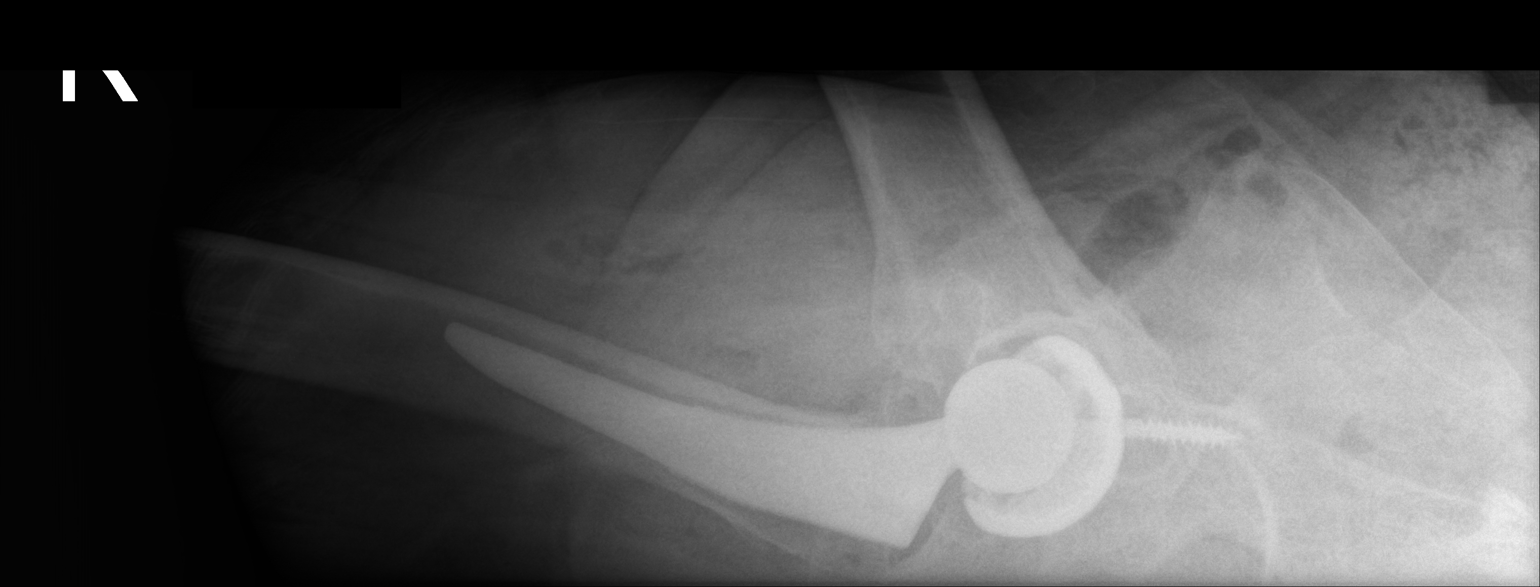

[2 of 2 positions shown; findings below may reference images not displayed]

FINDINGS: Total right hip replacement with good anatomic alignment. Hardware
intact.
IMPRESSION: Total right hip replacement.

## 2018-11-02 ENCOUNTER — Encounter: Payer: Self-pay | Admitting: Family

## 2018-11-17 LAB — HM MAMMOGRAPHY

## 2019-01-04 ENCOUNTER — Other Ambulatory Visit: Payer: Self-pay | Admitting: Family

## 2019-01-04 DIAGNOSIS — I10 Essential (primary) hypertension: Secondary | ICD-10-CM

## 2019-01-04 DIAGNOSIS — R739 Hyperglycemia, unspecified: Secondary | ICD-10-CM

## 2019-01-04 DIAGNOSIS — E785 Hyperlipidemia, unspecified: Secondary | ICD-10-CM

## 2019-01-26 ENCOUNTER — Encounter: Payer: Self-pay | Admitting: Family

## 2019-01-26 ENCOUNTER — Other Ambulatory Visit: Payer: Self-pay

## 2019-01-26 ENCOUNTER — Ambulatory Visit (INDEPENDENT_AMBULATORY_CARE_PROVIDER_SITE_OTHER): Payer: Medicare Other | Admitting: Family

## 2019-01-26 VITALS — BP 148/71 | HR 61 | Temp 97.4°F | Resp 16 | Ht 67.0 in | Wt 181.2 lb

## 2019-01-26 DIAGNOSIS — R809 Proteinuria, unspecified: Secondary | ICD-10-CM | POA: Diagnosis not present

## 2019-01-26 DIAGNOSIS — I10 Essential (primary) hypertension: Secondary | ICD-10-CM

## 2019-01-26 DIAGNOSIS — E1159 Type 2 diabetes mellitus with other circulatory complications: Secondary | ICD-10-CM

## 2019-01-26 DIAGNOSIS — R739 Hyperglycemia, unspecified: Secondary | ICD-10-CM | POA: Diagnosis not present

## 2019-01-26 LAB — URINALYSIS, ROUTINE W REFLEX MICROSCOPIC
Bilirubin Urine: NEGATIVE
Hgb urine dipstick: NEGATIVE
Ketones, ur: NEGATIVE
Nitrite: NEGATIVE
RBC / HPF: NONE SEEN
Specific Gravity, Urine: 1.01 (ref 1.000–1.030)
Total Protein, Urine: NEGATIVE
Urine Glucose: NEGATIVE
Urobilinogen, UA: 0.2 (ref 0.0–1.0)
pH: 8 (ref 5.0–8.0)

## 2019-01-26 LAB — HEMOGLOBIN A1C: Hgb A1c MFr Bld: 6.2 % (ref 4.6–6.5)

## 2019-01-26 LAB — BASIC METABOLIC PANEL
BUN: 15 mg/dL (ref 6–23)
CO2: 30 mEq/L (ref 19–32)
Calcium: 9.7 mg/dL (ref 8.4–10.5)
Chloride: 99 mEq/L (ref 96–112)
Creatinine, Ser: 0.9 mg/dL (ref 0.40–1.20)
GFR: 61.28 mL/min (ref >60.00)
Glucose, Bld: 96 mg/dL (ref 70–99)
Potassium: 4.1 mEq/L (ref 3.5–5.1)
Sodium: 137 mEq/L (ref 135–145)

## 2019-01-26 MED ORDER — AMLODIPINE BESYLATE 2.5 MG PO TABS: 2.5000 mg | ORAL_TABLET | Freq: Every day | ORAL | 1 refills | Status: DC

## 2019-01-26 NOTE — Patient Instructions (Signed)
Please complete lab work prior to leaving.   

## 2019-01-26 NOTE — Progress Notes (Signed)
Subjective:    Patient ID: Patricia Lee, female    DOB: 09-Nov-1945, 73 y.o.   MRN: [id number]  HPI  Patient is a 73 yr old female who presents today for follow up of her hypertension.  Reports she is taking amlodipine 2.5mg  once daily.  She is also maintained on chlorthalidone as well.   Reports that she 124/63 this AM. Average at home is 128/73 at home. Reports that her bp was getting "really low" down to 109/68 one day and she felt woozy. That is when she decided to decrease her amlodipine from 5mg  to 2.5 mg.   BP Readings from Last 3 Encounters:  01/26/19 (!) 148/71  08/25/18 (!) 145/81  07/22/18 132/73   Hyperlipidemia- She is maintained on atorvastatin 10mg .  Denies myalgia.   Lab Results  Component Value Date   CHOL 182 01/13/2018   HDL 66.60 01/13/2018   LDLCALC 90 01/13/2018   LDLDIRECT 136.2 07/21/2012   TRIG 126.0 01/13/2018   CHOLHDL 3 01/13/2018   Hyperglycemia- reports that she has been eating healthy.   Lab Results  Component Value Date   HGBA1C 6.1 07/22/2018     Review of Systems See HPI  Past Medical History:  Diagnosis Date  . Allergy    spring seasonal  . Arthritis    hip   . 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 Hyperlipidemia 10/21/2016  . Hypertension      Social History   Socioeconomic History  . Marital status: Widowed    Spouse name: Not on file  . Number of children: Not on file  . Years of education: Not on file  . Highest education level: Not on file  Occupational History  . Not on file  Social Needs  . Financial resource strain: Not on file  . Food insecurity    Worry: Not on file    Inability: Not on file  . Transportation needs    Medical: Not on file    Non-medical: Not on file  Tobacco Use  . Smoking status: Former Research scientist (life sciences)  . Smokeless tobacco: Never Used  Substance and Sexual Activity  . Alcohol use: Yes    Alcohol/week: 6.0 standard drinks    Types: 6 Glasses of wine per week    Comment: wine, 1-2 glasses every other  day  . Drug use: No  . Sexual activity: Not on file  Lifestyle  . Physical activity    Days per week: Not on file    Minutes per session: Not on file  . Stress: Not on file  Relationships  . Social Herbalist on phone: Not on file    Gets together: Not on file    Attends religious service: Not on file    Active member of club or organization: Not on file    Attends meetings of clubs or organizations: Not on file    Relationship status: Not on file  . Intimate partner violence    Fear of current or ex partner: Not on file    Emotionally abused: Not on file    Physically abused: Not on file    Forced sexual activity: Not on file  Other Topics Concern  . Not on file  Social History Narrative   2 sons, one in [place] and one in [place].    Retired from [employer]   Enjoys going to the Y 3 times a week   Active in her church   Husband died in Sep 12, 2012  Past Surgical History:  Procedure Laterality Date  . COLONOSCOPY    . NO PAST SURGERIES    . POLYPECTOMY    . 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 Colon cancer Father        died at 20  . Stomach cancer Mother        died at 29  . Colon polyps Neg Hx   . Rectal cancer Neg Hx     Allergies  Allergen Reactions  . Codeine Other (See Comments)    Tachycardia and insomnia  . Erythromycin Other (See Comments)    Tachycardia and insomnia  . Influenza Vaccines Other (See Comments)    Nerve reaction similar to shingles- all nerves in back on fire   . Prednisone Other (See Comments)    Tachycardia and insomnia  . Tetracyclines & Related Other (See Comments)    Tachycardia/insomnia    Current Outpatient Medications on File Prior to Visit  Medication Sig Dispense Refill  . amLODipine  (NORVASC) 5 MG tablet TAKE 1 TABLET BY MOUTH  DAILY 90 tablet 3  . atorvastatin (LIPITOR) 10 MG tablet TAKE 1 TABLET BY MOUTH  DAILY 90 tablet 1  . Calcium Polycarbophil (CONCENTRATED FIBER PO) Take 3 tablets by mouth daily.    . chlorthalidone (HYGROTON) 25 MG tablet TAKE 1 TABLET BY MOUTH  DAILY 90 tablet 3  . Multiple Vitamins-Minerals (MULTIVITAMIN WITH MINERALS) tablet Take 1 tablet by mouth daily.      . Omega-3 Fatty Acids ([employer]S FISH OIL) 1200 MG CAPS Take 1,200 mg by mouth daily.    Marland Kitchen Propylene Glycol-Glycerin (SOOTHE) 0.6-0.6 % SOLN Apply 2 drops to eye daily as needed (dry eyes).    . Turmeric 500 MG CAPS Take 500 mg by mouth daily.    Marland Kitchen ipratropium (ATROVENT HFA) 17 MCG/ACT inhaler Inhale 2 puffs into the lungs every 6 (six) hours as needed for wheezing. 1 Inhaler 0   No current facility-administered medications on file prior to visit.     BP (!) 148/71 (BP Location: Right Arm, Patient Position: Sitting, Cuff Size: Small)   Pulse 61   Temp (!) 97.4 F (36.3 C) (Temporal)   Resp 16   Ht 5\' 7"  (1.702 m)   Wt 181 lb 3.2 oz (82.2 kg)   SpO2 100%   BMI 28.38 kg/m       Objective:   Physical Exam Constitutional:      Appearance: She is well-developed.  Neck:     Musculoskeletal: Neck supple.     Thyroid: No thyromegaly.  Cardiovascular:     Rate and Rhythm: Normal rate and regular rhythm.     Heart sounds: Normal heart sounds. No murmur.  Pulmonary:     Effort: Pulmonary effort is normal. No respiratory distress.     Breath sounds: Normal breath sounds. No wheezing.  Skin:    General: Skin is warm and dry.  Neurological:     Mental Status: She is alert and oriented to person, place, and time.  Psychiatric:        Behavior: Behavior normal.        Thought Content: Thought content normal.        Judgment: Judgment normal.           Assessment & Plan:  Hypertension-blood pressure is mildly elevated here however her home readings are much better.  Will continue  current meds/doses.  Obtain follow-up basic metabolic panel.  Proteinuria-this was noted on point-of-care dip.  Will repeat in the lab as I believe this is likely a false positive.  Hyperlipidemia- tolerating statin.  Will obtain follow-up lipid panel.  Continue same.  Hyperglycemia-will obtain follow-up A1c. Lab Results  Component Value Date   HGBA1C 6.1 07/22/2018

## 2019-03-14 ENCOUNTER — Other Ambulatory Visit: Payer: Self-pay | Admitting: Family

## 2019-04-28 NOTE — Progress Notes (Signed)
Virtual Visit via Video Note  I connected with patient on 04/29/19 at  9:30 AM EST by audio enabled telemedicine application and verified that I am speaking with the correct person using two identifiers.   THIS ENCOUNTER IS A VIRTUAL VISIT DUE TO COVID-19 - PATIENT WAS NOT SEEN IN THE OFFICE. PATIENT HAS CONSENTED TO VIRTUAL VISIT / TELEMEDICINE VISIT   Location of patient: home  Location of provider: office  I discussed the limitations of evaluation and management by telemedicine and the availability of in person appointments. The patient expressed understanding and agreed to proceed.   Subjective:   Patricia Lee is a 73 y.o. female who presents for Medicare Annual (Subsequent) preventive examination.  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 Now enjoys swimming 3 days per week.   Review of Systems:  Home Safety/Smoke Alarms: Feels safe in home. Smoke alarms in place.  Lives alone in 1 story home. Children visit at least once per week. 2 sons. Reports great support system.    Female:    Mammo- 11/17/18 Dexa scan- declines       CCS- 02/26/16. Recall 5 yrs.  Objective:     Vitals: Unable to assess. This visit is enabled though telemedicine due to Covid 19.  Advanced Directives 04/29/2019 04/09/2016 04/09/2016 04/03/2016 02/26/2016 02/13/2016  Does Patient Have a Medical Advance Directive? Yes Yes Yes Yes Yes Yes  Type of Paramedic of [place];Living will Healthcare Power of [place] of [place];Living will - Living will;Healthcare Power of Attorney  Does patient want to make changes to medical advance directive? No - Patient declined No - Patient declined - - - -  Copy of [place] in Chart? No - copy requested No - copy requested No - copy requested No - copy requested - -    Tobacco Social History   Tobacco Use  Smoking Status Former Smoker  Smokeless Tobacco Never Used      Counseling given: Not Answered   Clinical Intake: Pain : No/denies pain     Past Medical History:  Diagnosis Date  . Allergy    spring seasonal  . Arthritis    hip   . 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 Hyperlipidemia 10/21/2016  . Hypertension    Past Surgical History:  Procedure Laterality Date  . COLONOSCOPY    . NO PAST SURGERIES    . POLYPECTOMY    . 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 Colon cancer Father        died at 56  . Stomach cancer Mother        died at 68  . Colon polyps Neg Hx   . Rectal cancer Neg Hx    Social History   Socioeconomic History  . Marital status: Widowed    Spouse name: Not on file  . Number of children: Not on file  . Years of education: Not on file  . Highest education level: Not on file  Occupational History  . Not on file  Social Needs  . Financial resource strain: Not on file  . Food insecurity    Worry: Not on file    Inability: Not on file  . Transportation needs    Medical: Not on file  Non-medical: Not on file  Tobacco Use  . Smoking status: Former Research scientist (life sciences)  . Smokeless tobacco: Never Used  Substance and Sexual Activity  . Alcohol use: Yes    Alcohol/week: 6.0 standard drinks    Types: 6 Glasses of wine per week    Comment: wine, 1-2 glasses every other day  . Drug use: No  . Sexual activity: Not on file  Lifestyle  . Physical activity    Days per week: Not on file    Minutes per session: Not on file  . Stress: Not on file  Relationships  . Social Herbalist on phone: Not on file    Gets together: Not on file    Attends religious service: Not on file    Active member of club or organization: Not on file    Attends meetings of clubs or organizations: Not on file    Relationship status: Not on  file  Other Topics Concern  . Not on file  Social History Narrative   2 sons, one in [place] and one in [place].    Retired from [employer]   Enjoys going to the Y 3 times a week   Active in her church   Husband died in 28-Sep-2012    Outpatient Encounter Medications as of 04/29/2019  Medication Sig  . amLODipine (NORVASC) 2.5 MG tablet Take 1 tablet (2.5 mg total) by mouth daily.  Marland Kitchen atorvastatin (LIPITOR) 10 MG tablet TAKE 1 TABLET BY MOUTH  DAILY  . Calcium Polycarbophil (CONCENTRATED FIBER PO) Take 3 tablets by mouth daily.  . chlorthalidone (HYGROTON) 25 MG tablet TAKE 1 TABLET BY MOUTH  DAILY  . Multiple Vitamins-Minerals (MULTIVITAMIN WITH MINERALS) tablet Take 1 tablet by mouth daily.    . Omega-3 Fatty Acids (CVS FISH OIL) 1200 MG CAPS Take 1,200 mg by mouth daily.  Marland Kitchen Propylene Glycol-Glycerin (SOOTHE) 0.6-0.6 % SOLN Apply 2 drops to eye daily as needed (dry eyes).  . Turmeric 500 MG CAPS Take 500 mg by mouth daily.  . Cholecalciferol (VITAMIN D) 50 MCG (2000 UT) tablet    No facility-administered encounter medications on file as of 04/29/2019.     Activities of Daily Living In your present state of health, do you have any difficulty performing the following activities: 04/29/2019  Hearing? N  Vision? N  Difficulty concentrating or making decisions? N  Walking or climbing stairs? N  Dressing or bathing? N  Doing errands, shopping? N  Preparing Food and eating ? N  Using the Toilet? N  In the past six months, have you accidently leaked urine? N  Do you have problems with loss of bowel control? N  Managing your Medications? N  Managing your Finances? N  Housekeeping or managing your Housekeeping? N  Some recent data might be hidden    Patient Care Team: Debbrah Alar, NP as PCP - General (Internal Medicine)    Assessment:   This is a routine wellness examination for [name]. Physical assessment deferred to PCP.  Exercise Activities and Dietary  recommendations Current Exercise Habits: Home exercise routine, Time (Minutes): 30, Frequency (Times/Week): 3, Weekly Exercise (Minutes/Week): 90, Intensity: Mild, Exercise limited by: None identified   Diet (meal preparation, eat out, water intake, caffeinated beverages, dairy products, fruits and vegetables): in general, a "healthy" diet  , well balanced Breakfast: yogurt or fruit Lunch: (big meal) beef soup and salad Dinner:  cereal    Goals    . DIET - INCREASE WATER INTAKE  Fall Risk Fall Risk  04/29/2019 01/13/2018 10/21/2016 10/17/2015 08/23/2014  Falls in the past year? 0 No No No No  Number falls in past yr: 0 - - - -  Injury with Fall? 0 - - - -  Follow up Education provided;Falls prevention discussed - - - -    Depression Screen PHQ 2/9 Scores 04/29/2019 01/13/2018 10/21/2016 10/21/2016  PHQ - 2 Score 0 0 0 0  PHQ- 9 Score - - 1 -     Cognitive Function     6CIT Screen 04/29/2019  What Year? 0 points  What time? 0 points  Count back from 20 0 points  Months in reverse 0 points  Repeat phrase 0 points    Immunization History  Administered Date(s) Administered  . Influenza Whole 02/26/2010, 03/14/2011  . Influenza-Unspecified 02/08/2013, 03/22/2014  . Pneumococcal Conjugate-13 08/19/2013  . Pneumococcal Polysaccharide-23 11/17/2008, 01/13/2018  . Td 07/04/2005  . Tdap 02/28/2016  . Zoster 02/26/2010   Screening Tests Health Maintenance  Topic Date Due  . Hepatitis C Screening  1945/11/01  . URINE MICROALBUMIN  07/11/1955  . DEXA SCAN  07/23/2019 (Originally 07/10/2010)  . INFLUENZA VACCINE  09/08/2019 (Originally 01/09/2019)  . MAMMOGRAM  11/16/2020  . COLONOSCOPY  02/25/2021  . TETANUS/TDAP  02/27/2026  . PNA vac Low Risk Adult  Completed      Plan:   See you next year!  Keep up the great work!  Bring a copy of your living will and/or healthcare power of attorney to your next office visit.    I have personally reviewed and noted the following in  the patient's chart:   . Medical and social history . Use of alcohol, tobacco or illicit drugs  . Current medications and supplements . Functional ability and status . Nutritional status . Physical activity . Advanced directives . List of other physicians . Hospitalizations, surgeries, and ER visits in previous 12 months . Vitals . Screenings to include cognitive, depression, and falls . Referrals and appointments  In addition, I have reviewed and discussed with patient certain preventive protocols, quality metrics, and best practice recommendations. A written personalized care plan for preventive services as well as general preventive health recommendations were provided to patient.     Shela Nevin, [place]  04/29/2019

## 2019-04-29 ENCOUNTER — Encounter: Payer: Self-pay | Admitting: *Deleted

## 2019-04-29 ENCOUNTER — Ambulatory Visit (INDEPENDENT_AMBULATORY_CARE_PROVIDER_SITE_OTHER): Payer: Medicare Other | Admitting: *Deleted

## 2019-04-29 ENCOUNTER — Other Ambulatory Visit: Payer: Self-pay

## 2019-04-29 DIAGNOSIS — Z0000 Encounter for general adult medical examination without abnormal findings: Secondary | ICD-10-CM

## 2019-04-29 NOTE — Patient Instructions (Signed)
See you next year!  Keep up the great work!  Bring a copy of your living will and/or healthcare power of attorney to your next office visit.   Patricia Lee , Thank you for taking time to come for your Medicare Wellness Visit. I appreciate your ongoing commitment to your health goals. Please review the following plan we discussed and let me know if I can assist you in the future.   These are the goals we discussed: Goals    . DIET - INCREASE WATER INTAKE       This is a list of the screening recommended for you and due dates:  Health Maintenance  Topic Date Due  .  Hepatitis C: One time screening is recommended by Center for Disease Control  (CDC) for  adults born from [birth year] through 1965.   [birth year]  . Urine Protein Check  07/11/1955  . DEXA scan (bone density measurement)  07/23/2019*  . Flu Shot  09/08/2019*  . Mammogram  11/16/2020  . Colon Cancer Screening  02/25/2021  . Tetanus Vaccine  02/27/2026  . Pneumonia vaccines  Completed  *Topic was postponed. The date shown is not the original due date.    Preventive Care 73 Years and Older, Female Preventive care refers to lifestyle choices and visits with your health care provider that can promote health and wellness. This includes:  A yearly physical exam. This is also called an annual well check.  Regular dental and eye exams.  Immunizations.  Screening for certain conditions.  Healthy lifestyle choices, such as diet and exercise. What can I expect for my preventive care visit? Physical exam Your health care provider will check:  Height and weight. These may be used to calculate body mass index (BMI), which is a measurement that tells if you are at a healthy weight.  Heart rate and blood pressure.  Your skin for abnormal spots. Counseling Your health care provider may ask you questions about:  Alcohol, tobacco, and drug use.  Emotional well-being.  Home and relationship well-being.  Sexual activity.  Eating  habits.  History of falls.  Memory and ability to understand (cognition).  Work and work Statistician.  Pregnancy and menstrual history. What immunizations do I need?  Influenza (flu) vaccine  This is recommended every year. Tetanus, diphtheria, and pertussis (Tdap) vaccine  You may need a Td booster every 10 years. Varicella (chickenpox) vaccine  You may need this vaccine if you have not already been vaccinated. Zoster (shingles) vaccine  You may need this after age 73. Pneumococcal conjugate (PCV13) vaccine  One dose is recommended after age 73. Pneumococcal polysaccharide (PPSV23) vaccine  One dose is recommended after age 65. Measles, mumps, and rubella (MMR) vaccine  You may need at least one dose of MMR if you were born in 1957 or later. You may also need a second dose. Meningococcal conjugate (MenACWY) vaccine  You may need this if you have certain conditions. Hepatitis A vaccine  You may need this if you have certain conditions or if you travel or work in places where you may be exposed to hepatitis A. Hepatitis B vaccine  You may need this if you have certain conditions or if you travel or work in places where you may be exposed to hepatitis B. Haemophilus influenzae type b (Hib) vaccine  You may need this if you have certain conditions. You may receive vaccines as individual doses or as more than one vaccine together in one shot (combination vaccines). Talk with  your health care provider about the risks and benefits of combination vaccines. What tests do I need? Blood tests  Lipid and cholesterol levels. These may be checked every 5 years, or more frequently depending on your overall health.  Hepatitis C test.  Hepatitis B test. Screening  Lung cancer screening. You may have this screening every year starting at age 73 if you have a 30-pack-year history of smoking and currently smoke or have quit within the past 15 years.  Colorectal cancer screening.  All adults should have this screening starting at age 73 and continuing until age 34. Your health care provider may recommend screening at age 73 if you are at increased risk. You will have tests every 1-10 years, depending on your results and the type of screening test.  Diabetes screening. This is done by checking your blood sugar (glucose) after you have not eaten for a while (fasting). You may have this done every 1-3 years.  Mammogram. This may be done every 1-2 years. Talk with your health care provider about how often you should have regular mammograms.  BRCA-related cancer screening. This may be done if you have a family history of breast, ovarian, tubal, or peritoneal cancers. Other tests  Sexually transmitted disease (STD) testing.  Bone density scan. This is done to screen for osteoporosis. You may have this done starting at age 60. Follow these instructions at home: Eating and drinking  Eat a diet that includes fresh fruits and vegetables, whole grains, lean protein, and low-fat dairy products. Limit your intake of foods with high amounts of sugar, saturated fats, and salt.  Take vitamin and mineral supplements as recommended by your health care provider.  Do not drink alcohol if your health care provider tells you not to drink.  If you drink alcohol: ? Limit how much you have to 0-1 drink a day. ? Be aware of how much alcohol is in your drink. In the [place] one drink equals one 12 oz bottle of beer (355 mL), one 5 oz glass of wine (148 mL), or one 1 oz glass of hard liquor (44 mL). Lifestyle  Take daily care of your teeth and gums.  Stay active. Exercise for at least 30 minutes on 5 or more days each week.  Do not use any products that contain nicotine or tobacco, such as cigarettes, e-cigarettes, and chewing tobacco. If you need help quitting, ask your health care provider.  If you are sexually active, practice safe sex. Use a condom or other form of protection in order  to prevent STIs (sexually transmitted infections).  Talk with your health care provider about taking a low-dose aspirin or statin. What's next?  Go to your health care provider once a year for a well check visit.  Ask your health care provider how often you should have your eyes and teeth checked.  Stay up to date on all vaccines. This information is not intended to replace advice given to you by your health care provider. Make sure you discuss any questions you have with your health care provider. Document Released: 06/23/2015 Document Revised: 05/21/2018 Document Reviewed: 05/21/2018 Elsevier Patient Education  2020 [employer].

## 2019-07-05 ENCOUNTER — Encounter: Payer: Self-pay | Admitting: Family

## 2019-07-06 ENCOUNTER — Other Ambulatory Visit: Payer: Self-pay

## 2019-07-06 MED ORDER — AMLODIPINE BESYLATE 2.5 MG PO TABS: 2.5000 mg | ORAL_TABLET | Freq: Every day | ORAL | 1 refills | Status: DC

## 2019-07-09 ENCOUNTER — Ambulatory Visit: Payer: Medicare Other

## 2019-07-20 ENCOUNTER — Ambulatory Visit: Payer: Medicare Other

## 2019-07-27 ENCOUNTER — Ambulatory Visit: Payer: Medicare Other | Admitting: Family

## 2019-08-03 ENCOUNTER — Other Ambulatory Visit: Payer: Self-pay

## 2019-08-03 ENCOUNTER — Ambulatory Visit (INDEPENDENT_AMBULATORY_CARE_PROVIDER_SITE_OTHER): Payer: Medicare Other | Admitting: Family

## 2019-08-03 VITALS — BP 147/72 | HR 67 | Temp 97.3°F | Resp 16 | Ht 67.0 in | Wt 185.0 lb

## 2019-08-03 DIAGNOSIS — R7303 Prediabetes: Secondary | ICD-10-CM

## 2019-08-03 DIAGNOSIS — E785 Hyperlipidemia, unspecified: Secondary | ICD-10-CM | POA: Diagnosis not present

## 2019-08-03 DIAGNOSIS — I10 Essential (primary) hypertension: Secondary | ICD-10-CM

## 2019-08-03 LAB — LIPID PANEL
Cholesterol: 172 mg/dL (ref 0–200)
HDL: 61.5 mg/dL (ref >39.00)
LDL Cholesterol: 87 mg/dL (ref 0–99)
NonHDL: 110.14
Total CHOL/HDL Ratio: 3
Triglycerides: 114 mg/dL (ref 0.0–149.0)
VLDL: 22.8 mg/dL (ref 0.0–40.0)

## 2019-08-03 LAB — COMPREHENSIVE METABOLIC PANEL
ALT: 14 U/L (ref 0–35)
AST: 19 U/L (ref 0–37)
Albumin: 4.3 g/dL (ref 3.5–5.2)
Alkaline Phosphatase: 79 U/L (ref 39–117)
BUN: 22 mg/dL (ref 6–23)
CO2: 31 mEq/L (ref 19–32)
Calcium: 10.3 mg/dL (ref 8.4–10.5)
Chloride: 100 mEq/L (ref 96–112)
Creatinine, Ser: 0.89 mg/dL (ref 0.40–1.20)
GFR: 61.99 mL/min (ref >60.00)
Glucose, Bld: 94 mg/dL (ref 70–99)
Potassium: 4.5 mEq/L (ref 3.5–5.1)
Sodium: 137 mEq/L (ref 135–145)
Total Bilirubin: 0.7 mg/dL (ref 0.2–1.2)
Total Protein: 7.2 g/dL (ref 6.0–8.3)

## 2019-08-03 MED ORDER — ATORVASTATIN CALCIUM 10 MG PO TABS: 10.0000 mg | ORAL_TABLET | Freq: Every day | ORAL | 1 refills | Status: DC

## 2019-08-03 NOTE — Progress Notes (Signed)
Subjective:    Patient ID: Patricia Lee, female    DOB: 1946-02-09, 74 y.o.   MRN: [id number]  HPI   Patient is a 74 yr old female who presents today for follow up.  HTN- she is maintained on amlodipine 2.5mg , chlorthalidone 25mg .  Denies CP/sob or swelling.   BP Readings from Last 3 Encounters:  08/03/19 (!) 147/72  01/26/19 (!) 148/71  08/25/18 (!) 145/81   Borderline DM-  Lab Results  Component Value Date   HGBA1C 6.2 01/26/2019   HGBA1C 6.1 07/22/2018   HGBA1C 6.2 01/13/2018   Lab Results  Component Value Date   LDLCALC Sep 28, 1988 01/13/2018   CREATININE 0.90 01/26/2019   Hyperlipidemia- maintained on atorvastatin. Denies myalgia.  Lab Results  Component Value Date   CHOL 182 01/13/2018   HDL 66.60 01/13/2018   LDLCALC 90 01/13/2018   LDLDIRECT 136.2 07/21/2012   TRIG 126.0 01/13/2018   CHOLHDL 3 01/13/2018       Review of Systems    see HPI  Past Medical History:  Diagnosis Date  . Allergy    spring seasonal  . Arthritis    hip   . 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 Hyperlipidemia 10/21/2016  . Hypertension      Social History   Socioeconomic History  . Marital status: Widowed    Spouse name: Not on file  . Number of children: Not on file  . Years of education: Not on file  . Highest education level: Not on file  Occupational History  . Not on file  Tobacco Use  . Smoking status: Former Research scientist (life sciences)  . Smokeless tobacco: Never Used  Substance and Sexual Activity  . Alcohol use: Yes    Alcohol/week: 6.0 standard drinks    Types: 6 Glasses of wine per week    Comment: wine, 1-2 glasses every other day  . Drug use: No  . Sexual activity: Not on file  Other Topics Concern  . Not on file  Social History Narrative   2 sons, one in [place] and one in [place].    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 Difficulty of Paying Living Expenses: Not on file  Food Insecurity:   . Worried About Charity fundraiser in the Last Year: Not on file  . Ran Out of Food in the Last Year: Not on file  Transportation Needs:   . Lack of Transportation (Medical): Not on file  . Lack of Transportation (Non-Medical): Not on file  Physical Activity:   . Days of Exercise per Week: Not on file  . Minutes of Exercise per Session: Not on file  Stress:   . Feeling of Stress : Not on file  Social Connections:   . Frequency of Communication with Friends and Family: Not on file  . Frequency of Social Gatherings with Friends and Family: Not on file  . Attends Religious Services: Not on file  . Active Member of Clubs or Organizations: Not on file  . Attends Archivist Meetings: Not on file  . Marital Status: Not on file  Intimate Partner Violence:   . Fear of Current or Ex-Partner: Not on file  . Emotionally Abused: Not on file  . Physically Abused: Not on file  . Sexually Abused: Not on  file    Past Surgical History:  Procedure Laterality Date  . COLONOSCOPY    . NO PAST SURGERIES    . POLYPECTOMY    . 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 Colon cancer Father        died at 30  . Stomach cancer Mother        died at 17  . Colon polyps Neg Hx   . Rectal cancer Neg Hx     Allergies  Allergen Reactions  . Codeine Other (See Comments)    Tachycardia and insomnia  . Erythromycin Other (See Comments)    Tachycardia and insomnia  . Influenza Vaccines Other (See Comments)    Nerve reaction similar to shingles- all nerves in back on fire   . Prednisone Other (See Comments)    Tachycardia and insomnia  . Tetracyclines & Related Other (See Comments)    Tachycardia/insomnia    Current  Outpatient Medications on File Prior to Visit  Medication Sig Dispense Refill  . amLODipine (NORVASC) 2.5 MG tablet Take 1 tablet (2.5 mg total) by mouth daily. 90 tablet 1  . atorvastatin (LIPITOR) 10 MG tablet TAKE 1 TABLET BY MOUTH  DAILY 90 tablet 3  . Calcium Polycarbophil (CONCENTRATED FIBER PO) Take 3 tablets by mouth daily.    . chlorthalidone (HYGROTON) 25 MG tablet TAKE 1 TABLET BY MOUTH  DAILY 90 tablet 3  . Cholecalciferol (VITAMIN D) 50 MCG (2000 UT) tablet     . Multiple Vitamins-Minerals (MULTIVITAMIN WITH MINERALS) tablet Take 1 tablet by mouth daily.      . Omega-3 Fatty Acids ([employer] FISH OIL) 1200 MG CAPS Take 1,200 mg by mouth daily.    Marland Kitchen Propylene Glycol-Glycerin (SOOTHE) 0.6-0.6 % SOLN Apply 2 drops to eye daily as needed (dry eyes).    . Turmeric 500 MG CAPS Take 500 mg by mouth daily.     No current facility-administered medications on file prior to visit.    BP (!) 147/72 (BP Location: Left Arm, Patient Position: Sitting, Cuff Size: Small)   Pulse 67   Temp (!) 97.3 F (36.3 C) (Temporal)   Resp 16   Ht 5\' 7"  (1.702 m)   Wt 185 lb (83.9 kg)   SpO2 99%   BMI 28.98 kg/m    Objective:   Physical Exam Constitutional:      Appearance: She is well-developed.  Neck:     Thyroid: No thyromegaly.  Cardiovascular:     Rate and Rhythm: Normal rate and regular rhythm.     Heart sounds: Normal heart sounds. No murmur.  Pulmonary:     Effort: Pulmonary effort is normal. No respiratory distress.     Breath sounds: Normal breath sounds. No wheezing.  Musculoskeletal:     Cervical back: Neck supple.  Skin:    General: Skin is warm and dry.  Neurological:     Mental Status: She is alert and oriented to person, place, and time.  Psychiatric:        Behavior: Behavior normal.        Thought Content: Thought content normal.        Judgment: Judgment normal.           Assessment & Plan:   HTN- bp mildly elevated in the office today.  However home blood  pressure readings are much better.  Please see patient's readings above.  Will continue current meds/doses.  Hyperlipidemia-tolerating statin.  Will obtain follow-up lipid panel.    Borderline diabetes-will obtain A1c.  This visit occurred during the SARS-CoV-2 public health emergency.  Safety protocols were in place, including screening questions prior to the visit, additional usage of staff PPE, and extensive cleaning of exam room while observing appropriate contact time as indicated for disinfecting solutions.

## 2019-08-04 LAB — MICROALBUMIN / CREATININE URINE RATIO
Creatinine,U: 59.8 mg/dL
Microalb Creat Ratio: 1.2 mg/g (ref 0.0–30.0)
Microalb, Ur: <0.7 mg/dL (ref 0.0–1.9)

## 2019-08-05 LAB — HEMOGLOBIN A1C: Hgb A1c MFr Bld: 6.1 % (ref 4.6–6.5)

## 2019-09-25 ENCOUNTER — Other Ambulatory Visit: Payer: Self-pay | Admitting: Family

## 2019-11-05 ENCOUNTER — Other Ambulatory Visit: Payer: Self-pay | Admitting: Family

## 2019-11-23 DIAGNOSIS — X32XXXS Exposure to sunlight, sequela: Secondary | ICD-10-CM | POA: Diagnosis not present

## 2019-11-23 DIAGNOSIS — L814 Other melanin hyperpigmentation: Secondary | ICD-10-CM | POA: Diagnosis not present

## 2019-11-23 DIAGNOSIS — L821 Other seborrheic keratosis: Secondary | ICD-10-CM | POA: Diagnosis not present

## 2019-11-23 DIAGNOSIS — D239 Other benign neoplasm of skin, unspecified: Secondary | ICD-10-CM | POA: Diagnosis not present

## 2019-11-23 DIAGNOSIS — L720 Epidermal cyst: Secondary | ICD-10-CM | POA: Diagnosis not present

## 2019-11-25 DIAGNOSIS — Z1231 Encounter for screening mammogram for malignant neoplasm of breast: Secondary | ICD-10-CM | POA: Diagnosis not present

## 2019-11-25 LAB — HM MAMMOGRAPHY

## 2020-02-01 ENCOUNTER — Encounter: Payer: Self-pay | Admitting: Family

## 2020-02-01 ENCOUNTER — Other Ambulatory Visit: Payer: Self-pay

## 2020-02-01 ENCOUNTER — Ambulatory Visit (INDEPENDENT_AMBULATORY_CARE_PROVIDER_SITE_OTHER): Payer: Medicare Other | Admitting: Family

## 2020-02-01 VITALS — BP 132/70 | HR 62 | Temp 98.3°F | Resp 16 | Wt 183.0 lb

## 2020-02-01 DIAGNOSIS — I10 Essential (primary) hypertension: Secondary | ICD-10-CM | POA: Diagnosis not present

## 2020-02-01 DIAGNOSIS — R7303 Prediabetes: Secondary | ICD-10-CM | POA: Diagnosis not present

## 2020-02-01 DIAGNOSIS — E785 Hyperlipidemia, unspecified: Secondary | ICD-10-CM

## 2020-02-01 NOTE — Progress Notes (Signed)
Subjective:    Patient ID: Patricia Lee, female    DOB: 14-Jul-1945, 74 y.o.   MRN: [id number]  HPI  Patient is a 74 yr old female who presents today for follow up.  HTN- maintained on amlodipine 2.5mg  and chlorthalidone.  She denies edema.  BP Readings from Last 3 Encounters:  02/01/20 132/70  08/03/19 (!) 147/72  01/26/19 (!) 148/71   Hyperlipidemia- maintained on atorvastatin 10mg .   Lab Results  Component Value Date   CHOL 172 08/03/2019   HDL 61.50 08/03/2019   LDLCALC 87 08/03/2019   LDLDIRECT 136.2 07/21/2012   TRIG 114.0 08/03/2019   CHOLHDL 3 08/03/2019   Borderline diabetes- diet controlled.  Lab Results  Component Value Date   HGBA1C 6.1 08/03/2019   HGBA1C 6.2 01/26/2019   HGBA1C 6.1 07/22/2018   Lab Results  Component Value Date   MICROALBUR <0.7 08/03/2019   LDLCALC 87 08/03/2019   CREATININE 0.89 08/03/2019     Review of Systems  See HPI  Past Medical History:  Diagnosis Date  . Allergy    spring seasonal  . Arthritis    hip   . 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 Hyperlipidemia 10/21/2016  . Hypertension      Social History   Socioeconomic History  . Marital status: Widowed    Spouse name: Not on file  . Number of children: Not on file  . Years of education: Not on file  . Highest education level: Not on file  Occupational History  . Not on file  Tobacco Use  . Smoking status: Former Research scientist (life sciences)  . Smokeless tobacco: Never Used  Substance and Sexual Activity  . Alcohol use: Yes    Alcohol/week: 6.0 standard drinks    Types: 6 Glasses of wine per week    Comment: wine, 1-2 glasses every other day  . Drug use: No  . Sexual activity: Not on file  Other Topics Concern  . Not on file  Social History Narrative   2 sons, one in [place] and one in [place].    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 Difficulty of Paying Living Expenses: Not on file  Food Insecurity:   . Worried About Charity fundraiser in the Last Year: Not on file  . Ran Out of Food in the Last Year: Not on file  Transportation Needs:   . Lack of Transportation (Medical): Not on file  . Lack of Transportation (Non-Medical): Not on file  Physical Activity:   . Days of Exercise per Week: Not on file  . Minutes of Exercise per Session: Not on file  Stress:   . Feeling of Stress : Not on file  Social Connections:   . Frequency of Communication with Friends and Family: Not on file  . Frequency of Social Gatherings with Friends and Family: Not on file  . Attends Religious Services: Not on file  . Active Member of Clubs or Organizations: Not on file  . Attends Archivist Meetings: Not on file  . Marital Status: Not on file  Intimate Partner Violence:   . Fear of Current or Ex-Partner: Not on file  . Emotionally Abused: Not on file  . Physically Abused: Not on file  . Sexually Abused: Not on file  Past Surgical History:  Procedure Laterality Date  . COLONOSCOPY    . NO PAST SURGERIES    . POLYPECTOMY    . 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 Colon cancer Father        died at 34  . Stomach cancer Mother        died at 7  . Colon polyps Neg Hx   . Rectal cancer Neg Hx     Allergies  Allergen Reactions  . Codeine Other (See Comments)    Tachycardia and insomnia  . Erythromycin Other (See Comments)    Tachycardia and insomnia  . Influenza Vaccines Other (See Comments)    Nerve reaction similar to shingles- all nerves in back on fire   . Prednisone Other (See Comments)    Tachycardia and insomnia  . Tetracyclines & Related Other (See Comments)     Tachycardia/insomnia    Current Outpatient Medications on File Prior to Visit  Medication Sig Dispense Refill  . amLODipine (NORVASC) 2.5 MG tablet Take 1 tablet (2.5 mg total) by mouth daily. 90 tablet 1  . atorvastatin (LIPITOR) 10 MG tablet Take 1 tablet (10 mg total) by mouth daily. 90 tablet 1  . chlorthalidone (HYGROTON) 25 MG tablet TAKE 1 TABLET BY MOUTH  DAILY 90 tablet 3  . Cholecalciferol (VITAMIN D) 50 MCG (2000 UT) tablet     . Multiple Vitamins-Minerals (MULTIVITAMIN WITH MINERALS) tablet Take 1 tablet by mouth daily.      . Omega-3 Fatty Acids ([employer] FISH OIL) 1200 MG CAPS Take 1,200 mg by mouth daily.    . polycarbophil (FIBERCON) 625 MG tablet Take 625 mg by mouth daily.    Marland Kitchen Propylene Glycol-Glycerin (SOOTHE) 0.6-0.6 % SOLN Apply 2 drops to eye daily as needed (dry eyes).    . Turmeric 500 MG CAPS Take 500 mg by mouth daily.     No current facility-administered medications on file prior to visit.    BP 132/70 (BP Location: Left Arm, Patient Position: Sitting, Cuff Size: Small)   Pulse 62   Temp 98.3 F (36.8 C) (Oral)   Resp 16   Wt 183 lb (83 kg)   SpO2 98%   BMI 28.66 kg/m        Objective:   Physical Exam Constitutional:      Appearance: She is well-developed.  Neck:     Thyroid: No thyromegaly.  Cardiovascular:     Rate and Rhythm: Normal rate and regular rhythm.     Heart sounds: Normal heart sounds. No murmur heard.   Pulmonary:     Effort: Pulmonary effort is normal. No respiratory distress.     Breath sounds: Normal breath sounds. No wheezing.  Musculoskeletal:     Cervical back: Neck supple.  Skin:    General: Skin is warm and dry.  Neurological:     Mental Status: She is alert and oriented to person, place, and time.  Psychiatric:        Behavior: Behavior normal.        Thought Content: Thought content normal.        Judgment: Judgment normal.           Assessment & Plan:  HTN- bp stable on current meds. Continue same. Obtain  follow up bmet.  DM2- diet controlled. Clinically stable. Obtain follow up A1C.  Hyperlipidemia- lipids at goal. Continue current dose of lipitor.   This visit occurred during the SARS-CoV-2 public health emergency.  Safety protocols were in place, including screening questions prior to the visit, additional usage of staff PPE, and extensive cleaning of exam room while observing appropriate contact time as indicated for disinfecting solutions.

## 2020-02-02 LAB — BASIC METABOLIC PANEL
BUN/Creatinine Ratio: 23 (calc) — ABNORMAL HIGH (ref 6–22)
BUN: 22 mg/dL (ref 7–25)
CO2: 27 mmol/L (ref 20–32)
Calcium: 9.6 mg/dL (ref 8.6–10.4)
Chloride: 101 mmol/L (ref 98–110)
Creat: 0.96 mg/dL — ABNORMAL HIGH (ref 0.60–0.93)
Glucose, Bld: 95 mg/dL (ref 65–99)
Potassium: 4.1 mmol/L (ref 3.5–5.3)
Sodium: 137 mmol/L (ref 135–146)

## 2020-02-02 LAB — HEMOGLOBIN A1C
Hgb A1c MFr Bld: 5.9 % of total Hgb — ABNORMAL HIGH (ref <5.7)
Mean Plasma Glucose: 123 (calc)
eAG (mmol/L): 6.8 (calc)

## 2020-02-03 ENCOUNTER — Other Ambulatory Visit: Payer: Self-pay | Admitting: Family

## 2020-04-04 DIAGNOSIS — L720 Epidermal cyst: Secondary | ICD-10-CM | POA: Diagnosis not present

## 2020-04-26 ENCOUNTER — Other Ambulatory Visit: Payer: Self-pay | Admitting: Family

## 2020-05-08 ENCOUNTER — Telehealth: Payer: Self-pay

## 2020-05-08 NOTE — Telephone Encounter (Signed)
I left a message asking the patient to call me at [phone] to reschedule her yearly Medicare visit with nurse [facility] Coach).  If the patient calls back, please schedule Medicare Wellness Visit with Health Coach at next available opening, preferably in office but can be video or telephone. Last AWV 04/29/2019 VDM (Dee-Dee)

## 2020-05-11 ENCOUNTER — Ambulatory Visit: Payer: Self-pay | Admitting: *Deleted

## 2020-06-14 NOTE — Progress Notes (Signed)
Subjective:   Patricia Lee is a 75 y.o. female who presents for Medicare Annual (Subsequent) preventive examination.  Review of Systems     Cardiac Risk Factors include: advanced age (>42men, >68 women);hypertension;dyslipidemia     Objective:    Today's Vitals   06/15/20 0855 06/15/20 0900  BP: (!) 178/84   Pulse: 65   Resp: 16   Temp: 98.4 F (36.9 C)   TempSrc: Oral   SpO2: 98%   Weight: 186 lb 12.8 oz (84.7 kg)   Height: 5\' 7"  (1.702 m)   PainSc:  5    Body mass index is 29.26 kg/m.  Advanced Directives 06/15/2020 04/29/2019 04/09/2016 04/09/2016 04/03/2016 02/26/2016 02/13/2016  Does Patient Have a Medical Advance Directive? Yes Yes Yes Yes Yes Yes Yes  Type of Paramedic of [place];Living will [place];Living will Healthcare Power of [place] of [place];Living will - Living will;Healthcare Power of Attorney  Does patient want to make changes to medical advance directive? - No - Patient declined No - Patient declined - - - -  Copy of [place] in Chart? No - copy requested No - copy requested No - copy requested No - copy requested No - copy requested - -    Current Medications (verified) Outpatient Encounter Medications as of 06/15/2020  Medication Sig  . amLODipine (NORVASC) 2.5 MG tablet TAKE 1 TABLET BY MOUTH  DAILY  . atorvastatin (LIPITOR) 10 MG tablet TAKE 1 TABLET BY MOUTH  DAILY  . chlorthalidone (HYGROTON) 25 MG tablet TAKE 1 TABLET BY MOUTH  DAILY  . Cholecalciferol (VITAMIN D) 50 MCG (2000 UT) tablet   . Multiple Vitamins-Minerals (MULTIVITAMIN WITH MINERALS) tablet Take 1 tablet by mouth daily.  . Omega-3 Fatty Acids ([employer] FISH OIL) 1200 MG CAPS Take 1,200 mg by mouth daily.  . polycarbophil (FIBERCON) 625 MG tablet Take 625 mg by mouth daily.  Marland Kitchen Propylene Glycol-Glycerin 0.6-0.6 % SOLN Apply 2 drops to eye daily as needed (dry eyes).  . Turmeric 500  MG CAPS Take 500 mg by mouth daily.   No facility-administered encounter medications on file as of 06/15/2020.    Allergies (verified) Codeine, Erythromycin, Influenza vaccines, Prednisone, and Tetracyclines & related   History: Past Medical History:  Diagnosis Date  . Allergy    spring seasonal  . Arthritis    hip   . 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 Hyperlipidemia 10/21/2016  . Hypertension    Past Surgical History:  Procedure Laterality Date  . COLONOSCOPY    . NO PAST SURGERIES    . POLYPECTOMY    . 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 Colon cancer Father        died at 37  . Stomach cancer Mother        died at 50  . Colon polyps Neg Hx   . Rectal cancer Neg Hx    Social History   Socioeconomic History  . Marital status: Widowed    Spouse name: Not on file  . Number of children: Not on file  . Years of education: Not on file  . Highest education level: Not on file  Occupational History  . Occupation: retired  Tobacco Use  . Smoking status: Former  Smoker  . Smokeless tobacco: Never Used  Substance and Sexual Activity  . Alcohol use: Yes    Alcohol/week: 6.0 standard drinks    Types: 6 Glasses of wine per week    Comment: wine, 1-2 glasses every other day  . Drug use: No  . Sexual activity: Not on file  Other Topics Concern  . Not on file  Social History Narrative   2 sons, one in [place] and one in [place].    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 Difficulty of Paying Living Expenses: Not hard at all  Food Insecurity: No Food Insecurity  . Worried About Charity fundraiser in the Last  Year: Never true  . Ran Out of Food in the Last Year: Never true  Transportation Needs: No Transportation Needs  . Lack of Transportation (Medical): No  . Lack of Transportation (Non-Medical): No  Physical Activity: Sufficiently Active  . Days of Exercise per Week: 3 days  . Minutes of Exercise per Session: 60 min  Stress: No Stress Concern Present  . Feeling of Stress : Not at all  Social Connections: Moderately Integrated  . Frequency of Communication with Friends and Family: More than three times a week  . Frequency of Social Gatherings with Friends and Family: More than three times a week  . Attends Religious Services: 1 to 4 times per year  . Active Member of Clubs or Organizations: Yes  . Attends Archivist Meetings: 1 to 4 times per year  . Marital Status: Widowed    Tobacco Counseling Counseling given: Not Answered   Clinical Intake:  Pre-visit preparation completed: Yes  Pain : 0-10 Pain Score: 5  Pain Type: Chronic pain Pain Location: Shoulder Pain Orientation: Left Pain Onset: More than a month ago Pain Frequency: Constant Pain Relieving Factors: tylenol  Pain Relieving Factors: tylenol  Nutritional Status: BMI 25 -29 Overweight Nutritional Risks: None Diabetes: No  How often do you need to have someone help you when you read instructions, pamphlets, or other written materials from your doctor or pharmacy?: 1 - Never  Diabetic?No  Interpreter Needed?: No  Information entered by :: Caroleen Hamman LPN   Activities of Daily Living In your present state of health, do you have any difficulty performing the following activities: 06/15/2020  Hearing? N  Vision? N  Difficulty concentrating or making decisions? Y  Comment occasionally misplaces items  Walking or climbing stairs? N  Dressing or bathing? N  Doing errands, shopping? N  Preparing Food and eating ? N  Using the Toilet? N  In the past six months, have you accidently leaked urine? N   Do you have problems with loss of bowel control? N  Managing your Medications? N  Managing your Finances? N  Housekeeping or managing your Housekeeping? N  Some recent data might be hidden    Patient Care Team: Debbrah Alar, NP as PCP - General (Internal Medicine)  Indicate any recent Medical Services you may have received from other than [facility] providers in the past year (date may be approximate).     Assessment:   This is a routine wellness examination for Kenneth.  Hearing/Vision screen  Hearing Screening   125Hz  250Hz  500Hz  1000Hz  2000Hz  3000Hz  4000Hz  6000Hz  8000Hz   Right ear:  Left ear:           Comments: No issues  Vision Screening Comments: Wears glasses Last eye exam-10/2019-Dr. Renaldo Fiddler  Dietary issues and exercise activities discussed: Current Exercise Habits: Home exercise routine, Type of exercise: calisthenics;Other - see comments (swimming & water aerobics), Time (Minutes): 60, Frequency (Times/Week): 3, Weekly Exercise (Minutes/Week): 180, Intensity: Mild, Exercise limited by: None identified  Goals    . DIET - INCREASE WATER INTAKE      Depression Screen PHQ 2/9 Scores 06/15/2020 04/29/2019 01/13/2018 10/21/2016 10/21/2016 10/17/2015 08/23/2014  PHQ - 2 Score 0 0 0 0 0 0 0  PHQ- 9 Score - - - 1 - - -    Fall Risk Fall Risk  06/15/2020 04/29/2019 01/13/2018 10/21/2016 10/17/2015  Falls in the past year? 0 0 No No No  Number falls in past yr: 0 0 - - -  Injury with Fall? 0 0 - - -  Follow up Falls prevention discussed Education provided;Falls prevention discussed - - -    FALL RISK PREVENTION PERTAINING TO THE HOME:  Any stairs in or around the home? No  Home free of loose throw rugs in walkways, pet beds, electrical cords, etc? Yes  Adequate lighting in your home to reduce risk of falls? Yes   ASSISTIVE DEVICES UTILIZED TO PREVENT FALLS:  Life alert? No  Use of a cane, walker or w/c? No  Grab bars in the bathroom? Yes  Shower chair or bench in  shower? No  Elevated toilet seat or a handicapped toilet? No   TIMED UP AND GO:  Was the test performed? Yes .  Length of time to ambulate 10 feet: 9 sec.   Gait steady and fast without use of assistive device  Cognitive Function:Normal cognitive status assessed by direct observation by this Nurse Health Advisor. No abnormalities found.       6CIT Screen 06/15/2020 04/29/2019  What Year? 0 points 0 points  What month? 0 points -  What time? 0 points 0 points  Count back from 20 0 points 0 points  Months in reverse 0 points 0 points  Repeat phrase 0 points 0 points  Total Score 0 -    Immunizations Immunization History  Administered Date(s) Administered  . Influenza Whole 02/26/2010, 03/14/2011  . Influenza-Unspecified 02/08/2013, 03/22/2014  . PFIZER SARS-COV-2 Vaccination 07/07/2019, 07/28/2019, 03/13/2020  . Pneumococcal Conjugate-13 08/19/2013  . Pneumococcal Polysaccharide-23 11/17/2008, 01/13/2018  . Td 07/04/2005  . Tdap 02/28/2016  . Zoster 02/26/2010    TDAP status: Up to date  Flu vaccine status: Patient is allergic  Pneumococcal vaccine status: Up to date  Covid-19 vaccine status: Completed vaccines  Qualifies for Shingles Vaccine? Yes   Zostavax completed Yes   Shingrix Completed?: No.    Education has been provided regarding the importance of this vaccine. Patient has been advised to call insurance company to determine out of pocket expense if they have not yet received this vaccine. Advised may also receive vaccine at local pharmacy or Health Dept. Verbalized acceptance and understanding.  Screening Tests Health Maintenance  Topic Date Due  . Hepatitis C Screening  Never done  . URINE MICROALBUMIN  08/02/2020  . INFLUENZA VACCINE  09/07/2020 (Originally 01/09/2020)  . COLONOSCOPY (Pts 45-64yrs Insurance coverage will need to be confirmed)  02/25/2021  . MAMMOGRAM  11/24/2021  . TETANUS/TDAP  02/27/2026  . COVID-19 Vaccine  Completed  . PNA vac Low  Risk Adult  Completed  . DEXA SCAN  Discontinued  Health Maintenance  Health Maintenance Due  Topic Date Due  . Hepatitis C Screening  Never done  . URINE MICROALBUMIN  08/02/2020    Colorectal cancer screening: Type of screening: Colonoscopy. Completed 02/26/2016. Repeat every 5 years  Mammogram status: Completed Bilateral 11/25/2019. Repeat every year  Bone Density status: Declined  Lung Cancer Screening: (Low Dose CT Chest recommended if Age 69-80 years, 30 pack-year currently smoking OR have quit w/in 15years.) does not qualify.    Additional Screening:  Hepatitis C Screening: does qualify; Discuss with PCP  Vision Screening: Recommended annual ophthalmology exams for early detection of glaucoma and other disorders of the eye. Is the patient up to date with their annual eye exam?  Yes  Who is the provider or what is the name of the office in which the patient attends annual eye exams? Dr. Julien Girt I  Dental Screening: Recommended annual dental exams for proper oral hygiene  Community Resource Referral / Chronic Care Management: CRR required this visit?  No   CCM required this visit?  No      Plan:     I have personally reviewed and noted the following in the patient's chart:   . Medical and social history . Use of alcohol, tobacco or illicit drugs  . Current medications and supplements . Functional ability and status . Nutritional status . Physical activity . Advanced directives . List of other physicians . Hospitalizations, surgeries, and ER visits in previous 12 months . Vitals . Screenings to include cognitive, depression, and falls . Referrals and appointments  In addition, I have reviewed and discussed with patient certain preventive protocols, quality metrics, and best practice recommendations. A written personalized care plan for preventive services as well as general preventive health recommendations were provided to patient.   Patient access avs  on mychart.  Marta Antu, LPN   [id number]  Nurse Health Advisor  Nurse Notes: None

## 2020-06-15 ENCOUNTER — Ambulatory Visit (INDEPENDENT_AMBULATORY_CARE_PROVIDER_SITE_OTHER): Payer: Medicare Other

## 2020-06-15 ENCOUNTER — Other Ambulatory Visit: Payer: Self-pay

## 2020-06-15 VITALS — BP 178/84 | HR 65 | Temp 98.4°F | Resp 16 | Ht 67.0 in | Wt 186.8 lb

## 2020-06-15 DIAGNOSIS — Z0000 Encounter for general adult medical examination without abnormal findings: Secondary | ICD-10-CM

## 2020-06-15 NOTE — Patient Instructions (Signed)
Ms. Patricia Lee , Thank you for taking time to come for your Medicare Wellness Visit. I appreciate your ongoing commitment to your health goals. Please review the following plan we discussed and let me know if I can assist you in the future.   Screening recommendations/referrals: Colonoscopy: Completed 02/26/2016-Due-02/25/2021 Mammogram: Completed 11/25/2019-Due 11/24/2020 Bone Density: Declined. Recommended yearly ophthalmology/optometry visit for glaucoma screening and checkup Recommended yearly dental visit for hygiene and checkup  Vaccinations: Influenza vaccine: Allergic  Pneumococcal vaccine: Completed vaccines Tdap vaccine: Up to date-Due-02/27/2026 Shingles vaccine: Discuss with pharmacy  Covid-19:Completed vaccines  Advanced directives: Please bring a copy for your chart  Conditions/risks identified: See problem list  Next appointment: Follow up in one year for your annual wellness visit    Preventive Care 65 Years and Older, Female Preventive care refers to lifestyle choices and visits with your health care provider that can promote health and wellness. What does preventive care include?  A yearly physical exam. This is also called an annual well check.  Dental exams once or twice a year.  Routine eye exams. Ask your health care provider how often you should have your eyes checked.  Personal lifestyle choices, including:  Daily care of your teeth and gums.  Regular physical activity.  Eating a healthy diet.  Avoiding tobacco and drug use.  Limiting alcohol use.  Practicing safe sex.  Taking low-dose aspirin every day.  Taking vitamin and mineral supplements as recommended by your health care provider. What happens during an annual well check? The services and screenings done by your health care provider during your annual well check will depend on your age, overall health, lifestyle risk factors, and family history of disease. Counseling  Your health care provider  may ask you questions about your:  Alcohol use.  Tobacco use.  Drug use.  Emotional well-being.  Home and relationship well-being.  Sexual activity.  Eating habits.  History of falls.  Memory and ability to understand (cognition).  Work and work Astronomer.  Reproductive health. Screening  You may have the following tests or measurements:  Height, weight, and BMI.  Blood pressure.  Lipid and cholesterol levels. These may be checked every 5 years, or more frequently if you are over 65 years old.  Skin check.  Lung cancer screening. You may have this screening every year starting at age 13 if you have a 30-pack-year history of smoking and currently smoke or have quit within the past 15 years.  Fecal occult blood test (FOBT) of the stool. You may have this test every year starting at age 71.  Flexible sigmoidoscopy or colonoscopy. You may have a sigmoidoscopy every 5 years or a colonoscopy every 10 years starting at age 20.  Hepatitis C blood test.  Hepatitis B blood test.  Sexually transmitted disease (STD) testing.  Diabetes screening. This is done by checking your blood sugar (glucose) after you have not eaten for a while (fasting). You may have this done every 1-3 years.  Bone density scan. This is done to screen for osteoporosis. You may have this done starting at age 41.  Mammogram. This may be done every 1-2 years. Talk to your health care provider about how often you should have regular mammograms. Talk with your health care provider about your test results, treatment options, and if necessary, the need for more tests. Vaccines  Your health care provider may recommend certain vaccines, such as:  Influenza vaccine. This is recommended every year.  Tetanus, diphtheria, and acellular pertussis (Tdap,  Td) vaccine. You may need a Td booster every 10 years.  Zoster vaccine. You may need this after age 48.  Pneumococcal 13-valent conjugate (PCV13) vaccine.  One dose is recommended after age 49.  Pneumococcal polysaccharide (PPSV23) vaccine. One dose is recommended after age 22. Talk to your health care provider about which screenings and vaccines you need and how often you need them. This information is not intended to replace advice given to you by your health care provider. Make sure you discuss any questions you have with your health care provider. Document Released: 06/23/2015 Document Revised: 02/14/2016 Document Reviewed: 03/28/2015 Elsevier Interactive Patient Education  2017 [place] Prevention in the Home Falls can cause injuries. They can happen to people of all ages. There are many things you can do to make your home safe and to help prevent falls. What can I do on the outside of my home?  Regularly fix the edges of walkways and driveways and fix any cracks.  Remove anything that might make you trip as you walk through a door, such as a raised step or threshold.  Trim any bushes or trees on the path to your home.  Use bright outdoor lighting.  Clear any walking paths of anything that might make someone trip, such as rocks or tools.  Regularly check to see if handrails are loose or broken. Make sure that both sides of any steps have handrails.  Any raised decks and porches should have guardrails on the edges.  Have any leaves, snow, or ice cleared regularly.  Use sand or salt on walking paths during winter.  Clean up any spills in your garage right away. This includes oil or grease spills. What can I do in the bathroom?  Use night lights.  Install grab bars by the toilet and in the tub and shower. Do not use towel bars as grab bars.  Use non-skid mats or decals in the tub or shower.  If you need to sit down in the shower, use a plastic, non-slip stool.  Keep the floor dry. Clean up any water that spills on the floor as soon as it happens.  Remove soap buildup in the tub or shower regularly.  Attach bath  mats securely with double-sided non-slip rug tape.  Do not have throw rugs and other things on the floor that can make you trip. What can I do in the bedroom?  Use night lights.  Make sure that you have a light by your bed that is easy to reach.  Do not use any sheets or blankets that are too big for your bed. They should not hang down onto the floor.  Have a firm chair that has side arms. You can use this for support while you get dressed.  Do not have throw rugs and other things on the floor that can make you trip. What can I do in the kitchen?  Clean up any spills right away.  Avoid walking on wet floors.  Keep items that you use a lot in easy-to-reach places.  If you need to reach something above you, use a strong step stool that has a grab bar.  Keep electrical cords out of the way.  Do not use floor polish or wax that makes floors slippery. If you must use wax, use non-skid floor wax.  Do not have throw rugs and other things on the floor that can make you trip. What can I do with my stairs?  Do not  leave any items on the stairs.  Make sure that there are handrails on both sides of the stairs and use them. Fix handrails that are broken or loose. Make sure that handrails are as long as the stairways.  Check any carpeting to make sure that it is firmly attached to the stairs. Fix any carpet that is loose or worn.  Avoid having throw rugs at the top or bottom of the stairs. If you do have throw rugs, attach them to the floor with carpet tape.  Make sure that you have a light switch at the top of the stairs and the bottom of the stairs. If you do not have them, ask someone to add them for you. What else can I do to help prevent falls?  Wear shoes that:  Do not have high heels.  Have rubber bottoms.  Are comfortable and fit you well.  Are closed at the toe. Do not wear sandals.  If you use a stepladder:  Make sure that it is fully opened. Do not climb a closed  stepladder.  Make sure that both sides of the stepladder are locked into place.  Ask someone to hold it for you, if possible.  Clearly mark and make sure that you can see:  Any grab bars or handrails.  First and last steps.  Where the edge of each step is.  Use tools that help you move around (mobility aids) if they are needed. These include:  Canes.  Walkers.  Scooters.  Crutches.  Turn on the lights when you go into a dark area. Replace any light bulbs as soon as they burn out.  Set up your furniture so you have a clear path. Avoid moving your furniture around.  If any of your floors are uneven, fix them.  If there are any pets around you, be aware of where they are.  Review your medicines with your doctor. Some medicines can make you feel dizzy. This can increase your chance of falling. Ask your doctor what other things that you can do to help prevent falls. This information is not intended to replace advice given to you by your health care provider. Make sure you discuss any questions you have with your health care provider. Document Released: 03/23/2009 Document Revised: 11/02/2015 Document Reviewed: 07/01/2014 Elsevier Interactive Patient Education  2017 [employer].

## 2020-06-20 NOTE — Progress Notes (Addendum)
I had to do an addendum & send it to Dr. Charlett Blake that way.   I have personally reviewed this encounter including the documentation in this note and have collaborated with the care management provider regarding care management and care coordination activities to include development and update of the comprehensive care plan. I am certifying that I agree with the content of this note and encounter as supervising physician.

## 2020-08-08 ENCOUNTER — Other Ambulatory Visit: Payer: Self-pay

## 2020-08-08 ENCOUNTER — Ambulatory Visit (INDEPENDENT_AMBULATORY_CARE_PROVIDER_SITE_OTHER): Payer: Medicare Other | Admitting: Family

## 2020-08-08 VITALS — BP 157/69 | HR 61 | Temp 97.2°F | Resp 16 | Ht 67.0 in | Wt 184.0 lb

## 2020-08-08 DIAGNOSIS — I10 Essential (primary) hypertension: Secondary | ICD-10-CM | POA: Diagnosis not present

## 2020-08-08 DIAGNOSIS — R21 Rash and other nonspecific skin eruption: Secondary | ICD-10-CM

## 2020-08-08 DIAGNOSIS — R7303 Prediabetes: Secondary | ICD-10-CM

## 2020-08-08 DIAGNOSIS — E785 Hyperlipidemia, unspecified: Secondary | ICD-10-CM | POA: Diagnosis not present

## 2020-08-08 LAB — COMPREHENSIVE METABOLIC PANEL
ALT: 12 U/L (ref 0–35)
AST: 17 U/L (ref 0–37)
Albumin: 4.1 g/dL (ref 3.5–5.2)
Alkaline Phosphatase: 77 U/L (ref 39–117)
BUN: 19 mg/dL (ref 6–23)
CO2: 29 mEq/L (ref 19–32)
Calcium: 9.9 mg/dL (ref 8.4–10.5)
Chloride: 100 mEq/L (ref 96–112)
Creatinine, Ser: 0.93 mg/dL (ref 0.40–1.20)
GFR: 60.31 mL/min (ref >60.00)
Glucose, Bld: 100 mg/dL — ABNORMAL HIGH (ref 70–99)
Potassium: 4.3 mEq/L (ref 3.5–5.1)
Sodium: 136 mEq/L (ref 135–145)
Total Bilirubin: 0.9 mg/dL (ref 0.2–1.2)
Total Protein: 7.1 g/dL (ref 6.0–8.3)

## 2020-08-08 LAB — LIPID PANEL
Cholesterol: 186 mg/dL (ref 0–200)
HDL: 78.9 mg/dL (ref >39.00)
LDL Cholesterol: 87 mg/dL (ref 0–99)
NonHDL: 106.77
Total CHOL/HDL Ratio: 2
Triglycerides: 99 mg/dL (ref 0.0–149.0)
VLDL: 19.8 mg/dL (ref 0.0–40.0)

## 2020-08-08 LAB — MICROALBUMIN / CREATININE URINE RATIO
Creatinine,U: 41 mg/dL
Microalb Creat Ratio: 1.7 mg/g (ref 0.0–30.0)
Microalb, Ur: <0.7 mg/dL (ref 0.0–1.9)

## 2020-08-08 LAB — HEMOGLOBIN A1C: Hgb A1c MFr Bld: 6.1 % (ref 4.6–6.5)

## 2020-08-08 MED ORDER — BETAMETHASONE DIPROPIONATE 0.05 % EX CREA: TOPICAL_CREAM | Freq: Two times a day (BID) | CUTANEOUS | 0 refills | Status: DC

## 2020-08-08 MED ORDER — SHINGRIX 50 MCG/0.5ML IM SUSR: INTRAMUSCULAR | 1 refills | Status: DC

## 2020-08-08 NOTE — Patient Instructions (Signed)
Please complete lab work prior to leaving.   

## 2020-08-08 NOTE — Progress Notes (Signed)
Subjective:    Patient ID: Patricia Lee, female    DOB: 1945/08/02, 75 y.o.   MRN: [id number]  HPI  Patient is a 75 yr old female who presents today for routine follow up.  HTN- she is maintained on chlorthalidone 25mg , amlodipine 2.5 mg.  128-149/57-78.  BP Readings from Last 3 Encounters:  08/08/20 (!) 157/69  06/15/20 (!) 178/84  02/01/20 132/70   Skin rash- c/o skin rash on her lower back. First noticed 6 weeks ago. Continues to itch- no significant improvement with OTC hydrocortisone.  Hyperlipidemia- maintained on atorvastatin 10mg .   Lab Results  Component Value Date   CHOL 172 08/03/2019   HDL 61.50 08/03/2019   LDLCALC 87 08/03/2019   LDLDIRECT 136.2 07/21/2012   TRIG 114.0 08/03/2019   CHOLHDL 3 08/03/2019      Review of Systems See HPI  Past Medical History:  Diagnosis Date  . Allergy    spring seasonal  . Arthritis    hip   . 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 Hyperlipidemia 10/21/2016  . Hypertension      Social History   Socioeconomic History  . Marital status: Widowed    Spouse name: Not on file  . Number of children: Not on file  . Years of education: Not on file  . Highest education level: Not on file  Occupational History  . Occupation: retired  Tobacco Use  . Smoking status: Former Research scientist (life sciences)  . Smokeless tobacco: Never Used  Substance and Sexual Activity  . Alcohol use: Yes    Alcohol/week: 6.0 standard drinks    Types: 6 Glasses of wine per week    Comment: wine, 1-2 glasses every other day  . Drug use: No  . Sexual activity: Not on file  Other Topics Concern  . Not on file  Social History Narrative   2 sons, one in [place] and one in [place].    Retired from Multimedia programmer court   Enjoys going to the Y 3 times a week   Active in her church   Husband died in 09/06/12   Social Determinants of Health   Financial Resource Strain: Low Risk   . Difficulty of Paying Living Expenses: Not hard at all  Food Insecurity: No Food  Insecurity  . Worried About Charity fundraiser in the Last Year: Never true  . Ran Out of Food in the Last Year: Never true  Transportation Needs: No Transportation Needs  . Lack of Transportation (Medical): No  . Lack of Transportation (Non-Medical): No  Physical Activity: Sufficiently Active  . Days of Exercise per Week: 3 days  . Minutes of Exercise per Session: 60 min  Stress: No Stress Concern Present  . Feeling of Stress : Not at all  Social Connections: Moderately Integrated  . Frequency of Communication with Friends and Family: More than three times a week  . Frequency of Social Gatherings with Friends and Family: More than three times a week  . Attends Religious Services: 1 to 4 times per year  . Active Member of Clubs or Organizations: Yes  . Attends Archivist Meetings: 1 to 4 times per year  . Marital Status: Widowed  Intimate Partner Violence: Not At Risk  . Fear of Current or Ex-Partner: No  . Emotionally Abused: No  . Physically Abused: No  . Sexually Abused: No    Past Surgical History:  Procedure Laterality Date  . COLONOSCOPY    . NO PAST SURGERIES    .  POLYPECTOMY    . 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 Colon cancer Father        died at 45  . Stomach cancer Mother        died at 48  . Colon polyps Neg Hx   . Rectal cancer Neg Hx     Allergies  Allergen Reactions  . Codeine Other (See Comments)    Tachycardia and insomnia  . Erythromycin Other (See Comments)    Tachycardia and insomnia  . Influenza Vaccines Other (See Comments)    Nerve reaction similar to shingles- all nerves in back on fire   . Prednisone Other (See Comments)    Tachycardia and insomnia  . Tetracyclines & Related Other (See Comments)    Tachycardia/insomnia     Current Outpatient Medications on File Prior to Visit  Medication Sig Dispense Refill  . amLODipine (NORVASC) 2.5 MG tablet TAKE 1 TABLET BY MOUTH  DAILY 90 tablet 3  . atorvastatin (LIPITOR) 10 MG tablet TAKE 1 TABLET BY MOUTH  DAILY 90 tablet 3  . chlorthalidone (HYGROTON) 25 MG tablet TAKE 1 TABLET BY MOUTH  DAILY 90 tablet 3  . Cholecalciferol (VITAMIN D) 50 MCG (2000 UT) tablet     . Multiple Vitamins-Minerals (MULTIVITAMIN WITH MINERALS) tablet Take 1 tablet by mouth daily.    . Omega-3 Fatty Acids (CVS FISH OIL) 1200 MG CAPS Take 1,200 mg by mouth daily.    . polycarbophil (FIBERCON) 625 MG tablet Take 625 mg by mouth daily.    Marland Kitchen Propylene Glycol-Glycerin 0.6-0.6 % SOLN Apply 2 drops to eye daily as needed (dry eyes).    . Turmeric 500 MG CAPS Take 500 mg by mouth daily.     No current facility-administered medications on file prior to visit.    BP (!) 157/69 (BP Location: Left Arm, Patient Position: Sitting, Cuff Size: Small)   Pulse 61   Temp (!) 97.2 F (36.2 C) (Oral)   Resp 16   Ht 5\' 7"  (1.702 m)   Wt 184 lb (83.5 kg)   SpO2 99%   BMI 28.82 kg/m       Objective:   Physical Exam Constitutional:      Appearance: She is well-developed and well-nourished.  Neck:     Thyroid: No thyromegaly.  Cardiovascular:     Rate and Rhythm: Normal rate and regular rhythm.     Heart sounds: Normal heart sounds. No murmur heard.   Pulmonary:     Effort: Pulmonary effort is normal. No respiratory distress.     Breath sounds: Normal breath sounds. No wheezing.  Musculoskeletal:     Cervical back: Neck supple.  Skin:    General: Skin is warm and dry.          Comments: Rash noted on right lower back. (a collection of small erythematous macules)  Neurological:     Mental Status: She is alert and oriented to person, place, and time.  Psychiatric:        Mood and Affect: Mood and affect normal.        Behavior: Behavior normal.        Thought Content: Thought content  normal.        Judgment: Judgment normal.  Assessment & Plan:  Skin rash- looks like a resolving herpes zoster rash.  I gave her an rx for diprolene to try. We also discussed capsaicin cream.  If no improvement she will call her dermatologist.  She has had Zostavax.  Recommended Shingrix series. I have sent the rx to her pharmacy.  HTN- bp elevated here but home readings look good.  Continue chlorthalidone 25mg , amlodipine 2.5 mg.  Hyperlipidemia- obtain follow up lipid panel. Continue atorvastatin 10mg  once daily.   Hyperglycemia- check A1C, urine microalbumin.  This visit occurred during the SARS-CoV-2 public health emergency.  Safety protocols were in place, including screening questions prior to the visit, additional usage of staff PPE, and extensive cleaning of exam room while observing appropriate contact time as indicated for disinfecting solutions.

## 2020-09-28 ENCOUNTER — Other Ambulatory Visit: Payer: Self-pay

## 2020-09-28 ENCOUNTER — Encounter (HOSPITAL_BASED_OUTPATIENT_CLINIC_OR_DEPARTMENT_OTHER): Payer: Self-pay

## 2020-09-28 ENCOUNTER — Emergency Department (HOSPITAL_BASED_OUTPATIENT_CLINIC_OR_DEPARTMENT_OTHER)
Admission: EM | Admit: 2020-09-28 | Discharge: 2020-09-28 | Disposition: A | Discharge status: Home / Self Care | Payer: Medicare Other | Attending: Emergency Medicine | Admitting: Emergency Medicine

## 2020-09-28 ENCOUNTER — Emergency Department (HOSPITAL_BASED_OUTPATIENT_CLINIC_OR_DEPARTMENT_OTHER): Payer: Medicare Other

## 2020-09-28 DIAGNOSIS — M1711 Unilateral primary osteoarthritis, right knee: Secondary | ICD-10-CM | POA: Diagnosis not present

## 2020-09-28 DIAGNOSIS — Z79899 Other long term (current) drug therapy: Secondary | ICD-10-CM | POA: Diagnosis not present

## 2020-09-28 DIAGNOSIS — Z96641 Presence of right artificial hip joint: Secondary | ICD-10-CM | POA: Insufficient documentation

## 2020-09-28 DIAGNOSIS — M25461 Effusion, right knee: Secondary | ICD-10-CM | POA: Diagnosis not present

## 2020-09-28 DIAGNOSIS — Z87891 Personal history of nicotine dependence: Secondary | ICD-10-CM | POA: Insufficient documentation

## 2020-09-28 DIAGNOSIS — I10 Essential (primary) hypertension: Secondary | ICD-10-CM | POA: Insufficient documentation

## 2020-09-28 DIAGNOSIS — M25561 Pain in right knee: Secondary | ICD-10-CM | POA: Insufficient documentation

## 2020-09-28 MED ORDER — HYDROCODONE-ACETAMINOPHEN 5-325 MG PO TABS: 1.0000 | ORAL_TABLET | Freq: Four times a day (QID) | ORAL | 0 refills | Status: DC | PRN

## 2020-09-28 MED ORDER — HYDROCODONE-ACETAMINOPHEN 5-325 MG PO TABS
1.0000 | ORAL_TABLET | Freq: Once | ORAL | Status: AC
  Administered 2020-09-28: 1 via ORAL
  Filled 2020-09-28: qty 1

## 2020-09-28 NOTE — ED Notes (Signed)
Presents with rt knee pain, onset of aching was 4 days ago, denies any injury/ trauma to area. Rt knee does not look swollen or any redness noted. Positive Homan's Sign is noted on Rt Lower Extremity. ED Provider at bedside

## 2020-09-28 NOTE — Discharge Instructions (Addendum)
You were seen in the emergency department for right knee pain.  You had x-rays that showed some arthritis changes but no fracture or dislocation. You were placed in a knee immobilizer.  Please take anti-inflammatories and use ice to the area.  We are giving a prescription for some narcotic pain medicine, these can make you constipated nauseous and dizzy.  Use caution.  Contact your orthopedic for close follow-up.  Return to the emergency department for any worsening or concerning symptoms

## 2020-09-28 NOTE — ED Provider Notes (Signed)
[facility] Provider Note   CSN: [id number] Arrival date & time: 09/28/20  2111     History Chief Complaint  Patient presents with  . Knee Pain    Patricia Lee is a 75 y.o. female.  She is complaining of right knee pain that is been going on for 4 days.  No trauma.  No swelling or redness.  No fever.  She said it was worse tonight trying to get up from sitting and is unable to bear weight.  She called her orthopedist as an appointment next month.  No distal numbness or weakness.  The history is provided by the patient.  Knee Pain Location:  Knee Time since incident:  4 days Injury: no   Knee location:  R knee Pain details:    Quality:  Aching   Severity:  Severe   Onset quality:  Gradual   Timing:  Intermittent   Progression:  Worsening Chronicity:  New Dislocation: no   Prior injury to area:  No Relieved by:  None tried Worsened by:  Bearing weight Ineffective treatments:  None tried Associated symptoms: no back pain, no fever, no numbness and no swelling        Past Medical History:  Diagnosis Date  . Allergy    spring seasonal  . Arthritis    hip   . 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 Hyperlipidemia 10/21/2016  . Hypertension     Patient Active Problem List   Diagnosis Date Noted  . 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 Hyperlipidemia 10/21/2016  . Overweight (BMI 25.0-29.9) 04/10/2016  . 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 Routine general medical examination at a health care facility 10/17/2015  . History of skin cancer 07/28/2012  . HERPES ZOSTER 11/27/2008  . PREMATURE VENTRICULAR CONTRACTIONS 11/17/2008  . Essential hypertension 09/10/2007    Past Surgical History:  Procedure Laterality Date  . COLONOSCOPY    . NO PAST SURGERIES    . POLYPECTOMY    . TOTAL HIP ARTHROPLASTY Right 04/09/2016   Procedure: RIGHT TOTAL HIP ARTHROPLASTY ANTERIOR APPROACH;  Surgeon: Paralee Cancel, MD;  Location: [facility];  Service: Orthopedics;   Laterality: Right;     OB History   No obstetric history on file.     Family History  Problem Relation Age of Onset  . Colon cancer Father        died at 64  . Stomach cancer Mother        died at 64  . Colon polyps Neg Hx   . Rectal cancer Neg Hx     Social History   Tobacco Use  . Smoking status: Former Research scientist (life sciences)  . Smokeless tobacco: Never Used  Substance Use Topics  . Alcohol use: Yes    Alcohol/week: 6.0 standard drinks    Types: 6 Glasses of wine per week    Comment: wine, 1-2 glasses every other day  . Drug use: No    Home Medications Prior to Admission medications   Medication Sig Start Date End Date Taking? Authorizing Provider  amLODipine (NORVASC) 2.5 MG tablet TAKE 1 TABLET BY MOUTH  DAILY 04/27/20   Debbrah Alar, NP  atorvastatin (LIPITOR) 10 MG tablet TAKE 1 TABLET BY MOUTH  DAILY 02/04/20   Debbrah Alar, NP  betamethasone dipropionate 0.05 % cream Apply topically 2 (two) times daily. 08/08/20   Debbrah Alar, NP  chlorthalidone (HYGROTON) 25 MG tablet TAKE 1 TABLET BY MOUTH  DAILY 11/09/19   Debbrah Alar, NP  Cholecalciferol (VITAMIN D) 50 MCG (2000 UT) tablet     [provider]  Multiple Vitamins-Minerals (MULTIVITAMIN WITH MINERALS) tablet Take 1 tablet by mouth daily.    [provider]  Omega-3 Fatty Acids (CVS FISH OIL) 1200 MG CAPS Take 1,200 mg by mouth daily.    [provider]  polycarbophil (FIBERCON) 625 MG tablet Take 625 mg by mouth daily.    [provider]  Propylene Glycol-Glycerin 0.6-0.6 % SOLN Apply 2 drops to eye daily as needed (dry eyes).    [provider]  Turmeric 500 MG CAPS Take 500 mg by mouth daily.    [provider]  Zoster Vaccine Adjuvanted [facility]) injection Inject 0.5mg  IM now and again in 2-6 months. 08/08/20   Debbrah Alar, NP    Allergies    Codeine, Erythromycin, Influenza vaccines, Prednisone, and Tetracyclines & related  Review of  Systems   Review of Systems  Constitutional: Negative for fever.  HENT: Negative for sore throat.   Respiratory: Negative for shortness of breath.   Cardiovascular: Negative for chest pain and leg swelling.  Gastrointestinal: Negative for abdominal pain.  Genitourinary: Negative for dysuria.  Musculoskeletal: Negative for back pain.  Skin: Negative for rash.  Neurological: Negative for headaches.    Physical Exam Updated Vital Signs BP (!) 185/84 (BP Location: Left Arm)   Pulse 94   Temp 98.3 F (36.8 C) (Oral)   Resp 18   Ht 5\' 7"  (1.702 m)   Wt 81.6 kg   SpO2 98%   BMI 28.19 kg/m   Physical Exam Vitals and nursing note reviewed.  Constitutional:      Appearance: Normal appearance. She is well-developed.  HENT:     Head: Normocephalic and atraumatic.  Eyes:     Conjunctiva/sclera: Conjunctivae normal.  Musculoskeletal:        General: Tenderness present. No swelling or signs of injury.     Cervical back: Neck supple.     Right lower leg: No edema.     Left lower leg: No edema.  Skin:    General: Skin is warm and dry.     Capillary Refill: Capillary refill takes less than 2 seconds.  Neurological:     General: No focal deficit present.     Mental Status: She is alert.     GCS: GCS eye subscore is 4. GCS verbal subscore is 5. GCS motor subscore is 6.     ED Results / Procedures / Treatments   Labs (all labs ordered are listed, but only abnormal results are displayed) Labs Reviewed - No data to display  EKG None  Radiology DG Knee Complete 4 Views Right  Result Date: 09/28/2020 CLINICAL DATA:  Knee pain. EXAM: RIGHT KNEE - COMPLETE 4+ VIEW COMPARISON:  None. FINDINGS: Early joint space narrowing in the patellofemoral compartment. Early spurring. No acute bony abnormality. Specifically, no fracture, subluxation, or dislocation. No joint effusion. IMPRESSION: No acute bony abnormality. Electronically Signed   By: Rolm Baptise M.D.   On: 09/28/2020 21:54     Procedures Procedures   Medications Ordered in ED Medications  HYDROcodone-acetaminophen (NORCO/VICODIN) 5-325 MG per tablet 1 tablet (1 tablet Oral Given 09/28/20 2156)    ED Course  I have reviewed the triage vital signs and the nursing notes.  Pertinent labs & imaging results that were available during my care of the patient were reviewed by me and considered in my medical decision making (see chart for details).  Clinical Course as  of 09/29/20 0946  Thu Sep 28, 2020  2152 She is having no pain at rest.  She says she only has pain when she bears weight.  X-rays interpreted by me as no acute fracture or dislocation. [MB]    Clinical Course User Index [MB] Hayden Rasmussen, MD   MDM Rules/Calculators/A&P                         Differential diagnosis includes Baker's cyst, ligamentous injury, cartilaginous injury, fracture, dislocation.  X-ray not showing any acute findings.  Patient placed in knee immobilizer and given some pain medication.  Recommended close follow-up with orthopedics.  Return instructions discussed. Final Clinical Impression(s) / ED Diagnoses Final diagnoses:  Acute pain of right knee    Rx / DC Orders ED Discharge Orders         Ordered    HYDROcodone-acetaminophen (NORCO/VICODIN) 5-325 MG tablet  Every 6 hours PRN        09/28/20 2228           Hayden Rasmussen, MD 09/29/20 [phone]

## 2020-09-28 NOTE — ED Triage Notes (Signed)
Pt c/o right knee pain x 4 days-worse this afternoon/worse with weight bearing-denies injury-to triage in w/c-NAD

## 2020-09-28 NOTE — ED Notes (Signed)
ED Provider at bedside. 

## 2020-09-28 NOTE — ED Notes (Signed)
Pt ambulated in room with walker uses at baseline

## 2020-10-04 DIAGNOSIS — M25561 Pain in right knee: Secondary | ICD-10-CM | POA: Diagnosis not present

## 2020-10-04 DIAGNOSIS — M1712 Unilateral primary osteoarthritis, left knee: Secondary | ICD-10-CM | POA: Diagnosis not present

## 2020-10-06 ENCOUNTER — Other Ambulatory Visit: Payer: Self-pay | Admitting: Family

## 2020-11-29 DIAGNOSIS — M1712 Unilateral primary osteoarthritis, left knee: Secondary | ICD-10-CM | POA: Diagnosis not present

## 2020-11-29 DIAGNOSIS — M25561 Pain in right knee: Secondary | ICD-10-CM | POA: Diagnosis not present

## 2020-11-30 DIAGNOSIS — Z1231 Encounter for screening mammogram for malignant neoplasm of breast: Secondary | ICD-10-CM | POA: Diagnosis not present

## 2020-11-30 LAB — HM MAMMOGRAPHY

## 2020-12-07 DIAGNOSIS — X32XXXS Exposure to sunlight, sequela: Secondary | ICD-10-CM | POA: Diagnosis not present

## 2020-12-07 DIAGNOSIS — L720 Epidermal cyst: Secondary | ICD-10-CM | POA: Diagnosis not present

## 2020-12-07 DIAGNOSIS — L821 Other seborrheic keratosis: Secondary | ICD-10-CM | POA: Diagnosis not present

## 2020-12-07 DIAGNOSIS — D1801 Hemangioma of skin and subcutaneous tissue: Secondary | ICD-10-CM | POA: Diagnosis not present

## 2020-12-07 DIAGNOSIS — D2372 Other benign neoplasm of skin of left lower limb, including hip: Secondary | ICD-10-CM | POA: Diagnosis not present

## 2020-12-07 DIAGNOSIS — L814 Other melanin hyperpigmentation: Secondary | ICD-10-CM | POA: Diagnosis not present

## 2020-12-07 DIAGNOSIS — Z85828 Personal history of other malignant neoplasm of skin: Secondary | ICD-10-CM | POA: Diagnosis not present

## 2020-12-18 ENCOUNTER — Ambulatory Visit: Payer: Medicare Other | Attending: Internal Medicine

## 2020-12-18 DIAGNOSIS — Z23 Encounter for immunization: Secondary | ICD-10-CM

## 2020-12-18 NOTE — Progress Notes (Signed)
   Covid-19 Vaccination Clinic  Name:  Patricia Lee    MRN: [id number] DOB: [birth year]  12/18/2020  [name] was observed post Covid-19 immunization for 15 minutes without incident. She was provided with Vaccine Information Sheet and instruction to access the V-Safe system.   [name] was instructed to call 911 with any severe reactions post vaccine: Difficulty breathing  Swelling of face and throat  A fast heartbeat  A bad rash all over body  Dizziness and weakness   Immunizations Administered     Name Date Dose VIS Date Route   PFIZER Comrnaty(Gray TOP) Covid-19 Vaccine 12/18/2020 11:07 AM 0.3 mL 05/18/2020 Intramuscular   Manufacturer: [place]   Lot: [id number]   [place]: [phone]

## 2020-12-22 ENCOUNTER — Other Ambulatory Visit (HOSPITAL_BASED_OUTPATIENT_CLINIC_OR_DEPARTMENT_OTHER): Payer: Self-pay

## 2020-12-22 MED ORDER — COVID-19 MRNA VAC-TRIS(PFIZER) 30 MCG/0.3ML IM SUSP
INTRAMUSCULAR | 0 refills | Status: DC
  Filled 2020-12-22: qty 0.3, 1d supply, fill #0

## 2021-01-06 ENCOUNTER — Other Ambulatory Visit: Payer: Self-pay | Admitting: Family

## 2021-02-13 ENCOUNTER — Ambulatory Visit (INDEPENDENT_AMBULATORY_CARE_PROVIDER_SITE_OTHER): Payer: Medicare Other | Admitting: Family

## 2021-02-13 ENCOUNTER — Other Ambulatory Visit: Payer: Self-pay

## 2021-02-13 VITALS — BP 144/78 | HR 74 | Temp 98.5°F | Resp 16 | Wt 181.0 lb

## 2021-02-13 DIAGNOSIS — E785 Hyperlipidemia, unspecified: Secondary | ICD-10-CM | POA: Diagnosis not present

## 2021-02-13 DIAGNOSIS — R7303 Prediabetes: Secondary | ICD-10-CM | POA: Diagnosis not present

## 2021-02-13 DIAGNOSIS — M25512 Pain in left shoulder: Secondary | ICD-10-CM

## 2021-02-13 DIAGNOSIS — R739 Hyperglycemia, unspecified: Secondary | ICD-10-CM | POA: Diagnosis not present

## 2021-02-13 DIAGNOSIS — I10 Essential (primary) hypertension: Secondary | ICD-10-CM | POA: Diagnosis not present

## 2021-02-13 DIAGNOSIS — G8929 Other chronic pain: Secondary | ICD-10-CM | POA: Diagnosis not present

## 2021-02-13 LAB — BASIC METABOLIC PANEL
BUN: 19 mg/dL (ref 6–23)
CO2: 30 mEq/L (ref 19–32)
Calcium: 9.7 mg/dL (ref 8.4–10.5)
Chloride: 100 mEq/L (ref 96–112)
Creatinine, Ser: 0.97 mg/dL (ref 0.40–1.20)
GFR: 57.13 mL/min — ABNORMAL LOW (ref >60.00)
Glucose, Bld: 92 mg/dL (ref 70–99)
Potassium: 4.5 mEq/L (ref 3.5–5.1)
Sodium: 138 mEq/L (ref 135–145)

## 2021-02-13 LAB — HEMOGLOBIN A1C: Hgb A1c MFr Bld: 6.1 % (ref 4.6–6.5)

## 2021-02-13 MED ORDER — ATORVASTATIN CALCIUM 10 MG PO TABS: 10.0000 mg | ORAL_TABLET | Freq: Every day | ORAL | 1 refills | Status: DC

## 2021-02-13 MED ORDER — CHLORTHALIDONE 25 MG PO TABS: 25.0000 mg | ORAL_TABLET | Freq: Every day | ORAL | 1 refills | Status: DC

## 2021-02-13 MED ORDER — AMLODIPINE BESYLATE 2.5 MG PO TABS: 2.5000 mg | ORAL_TABLET | Freq: Every day | ORAL | 1 refills | Status: DC

## 2021-02-13 NOTE — Progress Notes (Signed)
Subjective:     Patient ID: Patricia Lee, female    DOB: 1946/01/20, 75 y.o.   MRN: [id number]  Chief Complaint  Patient presents with   Hypertension    Here for follow up   Shoulder Pain    Complains of left shoulder pain      Patient is a 75 yr old female who presents today for follow up.   HTN- BP medicaitons include amlodipine 2.'5mg'$  and chlorthalidone '25mg'$  once daily.   BP Readings from Last 3 Encounters:  02/13/21 (!) 144/78  09/28/20 (!) 165/73  08/08/20 (!) 157/69   Left Shoulder pain-  She reports that "it just aches." Reports tylenol as needed.   Home readings 114-143/66-75 Health Maintenance Due  Topic Date Due   Hepatitis C Screening  Never done   Zoster Vaccines- Shingrix (1 of 2) Never done    Past Medical History:  Diagnosis Date   Allergy    spring seasonal   Arthritis    hip    Borderline diabetes 12/06/2016   Hyperlipidemia 10/21/2016   Hypertension     Past Surgical History:  Procedure Laterality Date   COLONOSCOPY     NO PAST SURGERIES     POLYPECTOMY     TOTAL HIP ARTHROPLASTY Right 04/09/2016   Procedure: RIGHT TOTAL HIP ARTHROPLASTY ANTERIOR APPROACH;  Surgeon: Paralee Cancel, MD;  Location: [facility];  Service: Orthopedics;  Laterality: Right;    Family History  Problem Relation Age of Onset   Colon cancer Father        died at 50   Stomach cancer Mother        died at 59   Colon polyps Neg Hx    Rectal cancer Neg Hx     Social History   Socioeconomic History   Marital status: Widowed    Spouse name: Not on file   Number of children: Not on file   Years of education: Not on file   Highest education level: Not on file  Occupational History   Occupation: retired  Tobacco Use   Smoking status: Former   Smokeless tobacco: Never  Substance and Sexual Activity   Alcohol use: Yes    Alcohol/week: 6.0 standard drinks    Types: 6 Glasses of wine per week    Comment: wine, 1-2 glasses every other day   Drug use: No   Sexual  activity: Not on file  Other Topics Concern   Not on file  Social History Narrative   2 sons, one in [place] and one in [place].    Retired from Multimedia programmer court   Enjoys going to the Y 3 times a week   Active in her church   Husband died in 09/09/2012   Social Determinants of Health   Financial Resource Strain: Low Risk    Difficulty of Paying Living Expenses: Not hard at [employer] Insecurity: No Food Insecurity   Worried About Charity fundraiser in the Last Year: Never true   Arboriculturist in the Last Year: Never true  Transportation Needs: No Transportation Needs   Lack of Transportation (Medical): No   Lack of Transportation (Non-Medical): No  Physical Activity: Sufficiently Active   Days of Exercise per Week: 3 days   Minutes of Exercise per Session: 60 min  Stress: No Stress Concern Present   Feeling of Stress : Not at all  Social Connections: Moderately Integrated   Frequency of Communication with Friends and Family:  More than three times a week   Frequency of Social Gatherings with Friends and Family: More than three times a week   Attends Religious Services: 1 to 4 times per year   Active Member of [employer] or Organizations: Yes   Attends Archivist Meetings: 1 to 4 times per year   Marital Status: Widowed  Human resources officer Violence: Not At Risk   Fear of Current or Ex-Partner: No   Emotionally Abused: No   Physically Abused: No   Sexually Abused: No    Outpatient Medications Prior to Visit  Medication Sig Dispense Refill   betamethasone dipropionate 0.05 % cream Apply topically 2 (two) times daily. 30 g 0   Cholecalciferol (VITAMIN D) 50 MCG (2000 UT) tablet      COVID-19 mRNA Vac-TriS, Pfizer, SUSP injection Inject into the muscle. 0.3 mL 0   Multiple Vitamins-Minerals (MULTIVITAMIN WITH MINERALS) tablet Take 1 tablet by mouth daily.     Omega-3 Fatty Acids (CVS FISH OIL) 1200 MG CAPS Take 1,200 mg by mouth daily.     polycarbophil (FIBERCON)  625 MG tablet Take 625 mg by mouth daily.     Propylene Glycol-Glycerin 0.6-0.6 % SOLN Apply 2 drops to eye daily as needed (dry eyes).     Turmeric 500 MG CAPS Take 500 mg by mouth daily.     Zoster Vaccine Adjuvanted [facility]) injection Inject 0.'5mg'$  IM now and again in 2-6 months. 0.5 mL 1   amLODipine (NORVASC) 2.5 MG tablet TAKE 1 TABLET BY MOUTH  DAILY 90 tablet 3   atorvastatin (LIPITOR) 10 MG tablet TAKE 1 TABLET BY MOUTH  DAILY 90 tablet 3   chlorthalidone (HYGROTON) 25 MG tablet TAKE 1 TABLET BY MOUTH  DAILY 90 tablet 1   HYDROcodone-acetaminophen (NORCO/VICODIN) 5-325 MG tablet Take 1 tablet by mouth every 6 (six) hours as needed. 10 tablet 0   No facility-administered medications prior to visit.    Allergies  Allergen Reactions   Codeine Other (See Comments)    Tachycardia and insomnia   Erythromycin Other (See Comments)    Tachycardia and insomnia   Influenza Vaccines Other (See Comments)    Nerve reaction similar to shingles- all nerves in back on fire    Prednisone Other (See Comments)    Tachycardia and insomnia   Tetracyclines & Related Other (See Comments)    Tachycardia/insomnia    ROS See HPI    Objective:    Physical Exam Constitutional:      Appearance: She is well-developed.  Cardiovascular:     Rate and Rhythm: Normal rate and regular rhythm.     Heart sounds: Normal heart sounds. No murmur heard. Pulmonary:     Effort: Pulmonary effort is normal. No respiratory distress.     Breath sounds: Normal breath sounds. No wheezing.  Musculoskeletal:        General: Swelling (2+ bilateral LE edema) present.  Psychiatric:        Behavior: Behavior normal.        Thought Content: Thought content normal.        Judgment: Judgment normal.    BP (!) 144/78 (BP Location: Right Arm, Patient Position: Sitting, Cuff Size: Small)   Pulse 74   Temp 98.5 F (36.9 C) (Oral)   Resp 16   Wt 181 lb (82.1 kg)   SpO2 97%   BMI 28.35 kg/m  Wt Readings from Last 3  Encounters:  02/13/21 181 lb (82.1 kg)  09/28/20 180 lb (81.6 kg)  08/08/20 184 lb (83.5 kg)       Assessment & Plan:   Problem List Items Addressed This Visit       Unprioritized   Hyperlipidemia - Primary   Relevant Medications   amLODipine (NORVASC) 2.5 MG tablet   atorvastatin (LIPITOR) 10 MG tablet   chlorthalidone (HYGROTON) 25 MG tablet   Hyperglycemia   Relevant Orders   Basic metabolic panel   Hemoglobin A1c   Essential hypertension    BP stable. Continue amlodipine 2.5 and chlorthalidone '25mg'$ .       Relevant Medications   amLODipine (NORVASC) 2.5 MG tablet   atorvastatin (LIPITOR) 10 MG tablet   chlorthalidone (HYGROTON) 25 MG tablet   Other Relevant Orders   Basic metabolic panel   Chronic left shoulder pain    New. Reports left shoulder pain is relieved by tylenol. She is not interested in further evaluation at this time. She is focused on her upcoming Knee Replacement surgery.       Borderline diabetes    Check A1C.        I have discontinued Leda Quail. [place]'s HYDROcodone-acetaminophen. I have also changed her amLODipine, atorvastatin, and chlorthalidone. Additionally, I am having her maintain her multivitamin with minerals, CVS Fish Oil, Turmeric, Propylene Glycol-Glycerin, Vitamin D, polycarbophil, betamethasone dipropionate, Shingrix, and COVID-19 mRNA Vac-TriS [employer]).  Meds ordered this encounter  Medications   amLODipine (NORVASC) 2.5 MG tablet    Sig: Take 1 tablet (2.5 mg total) by mouth daily.    Dispense:  90 tablet    Refill:  1    Requesting 1 year supply    Order Specific Question:   Supervising Provider    Answer:   Penni Homans A [[id number]]   atorvastatin (LIPITOR) 10 MG tablet    Sig: Take 1 tablet (10 mg total) by mouth daily.    Dispense:  90 tablet    Refill:  1    Requesting 1 year supply    Order Specific Question:   Supervising Provider    Answer:   Penni Homans A [[id number]]   chlorthalidone (HYGROTON) 25 MG tablet    Sig: Take  1 tablet (25 mg total) by mouth daily.    Dispense:  90 tablet    Refill:  1    Requesting 1 year supply    Order Specific Question:   Supervising Provider    Answer:   Penni Homans A [[id number]]

## 2021-02-13 NOTE — Assessment & Plan Note (Signed)
BP stable. Continue amlodipine 2.5 and chlorthalidone '25mg'$ .

## 2021-02-13 NOTE — Assessment & Plan Note (Signed)
New. Reports left shoulder pain is relieved by tylenol. She is not interested in further evaluation at this time. She is focused on her upcoming Knee Replacement surgery.

## 2021-02-13 NOTE — Assessment & Plan Note (Signed)
Check A1C 

## 2021-03-19 ENCOUNTER — Encounter: Payer: Self-pay | Admitting: Gastroenterology

## 2021-03-21 ENCOUNTER — Ambulatory Visit: Payer: Medicare Other | Attending: Internal Medicine

## 2021-03-21 DIAGNOSIS — Z23 Encounter for immunization: Secondary | ICD-10-CM

## 2021-03-21 NOTE — Progress Notes (Signed)
   Covid-19 Vaccination Clinic  Name:  Patricia Lee    MRN: [id number] DOB: [birth year]  03/21/2021  Patricia Lee was observed post Covid-19 immunization for 15 minutes without incident. She was provided with Vaccine Information Sheet and instruction to access the V-Safe system.   Patricia Lee was instructed to call 911 with any severe reactions post vaccine: Difficulty breathing  Swelling of face and throat  A fast heartbeat  A bad rash all over body  Dizziness and weakness

## 2021-04-12 ENCOUNTER — Other Ambulatory Visit (HOSPITAL_BASED_OUTPATIENT_CLINIC_OR_DEPARTMENT_OTHER): Payer: Self-pay

## 2021-04-12 MED ORDER — PFIZER COVID-19 VAC BIVALENT 30 MCG/0.3ML IM SUSP
INTRAMUSCULAR | 0 refills | Status: DC
  Filled 2021-04-12: qty 0.3, 1d supply, fill #0

## 2021-04-20 ENCOUNTER — Encounter: Payer: Self-pay | Admitting: Gastroenterology

## 2021-04-20 ENCOUNTER — Ambulatory Visit: Payer: Medicare Other | Admitting: Gastroenterology

## 2021-04-20 VITALS — BP 160/68 | HR 76 | Ht 67.0 in | Wt 184.8 lb

## 2021-04-20 DIAGNOSIS — Z1211 Encounter for screening for malignant neoplasm of colon: Secondary | ICD-10-CM | POA: Diagnosis not present

## 2021-04-20 NOTE — Patient Instructions (Signed)
You have been scheduled for a colonoscopy. Please follow written instructions given to you at your visit today.  Please pick up your prep supplies at the pharmacy within the next 1-3 days. If you use inhalers (even only as needed), please bring them with you on the day of your procedure.   Due to recent changes in healthcare laws, you may see the results of your imaging and laboratory studies on MyChart before your provider has had a chance to review them.  We understand that in some cases there may be results that are confusing or concerning to you. Not all laboratory results come back in the same time frame and the provider may be waiting for multiple results in order to interpret others.  Please give [place] 48 hours in order for your provider to thoroughly review all the results before contacting the office for clarification of your results.    If you are age 22 or older, your body mass index should be between 23-30. Your Body mass index is 28.94 kg/m. If this is out of the aforementioned range listed, please consider follow up with your Primary Care Provider.  If you are age 2 or younger, your body mass index should be between 19-25. Your Body mass index is 28.94 kg/m. If this is out of the aformentioned range listed, please consider follow up with your Primary Care Provider.   ________________________________________________________  The [facility] providers would like to encourage you to use [facility] to communicate with providers for non-urgent requests or questions.  Due to long hold times on the telephone, sending your provider a message by [facility] may be a faster and more efficient way to get a response.  Please allow 48 business hours for a response.  Please remember that this is for non-urgent requests.  _______________________________________________________   I appreciate the  opportunity to care for you  Thank You   Harl Bowie , MD

## 2021-04-20 NOTE — Progress Notes (Signed)
Patricia Lee    [id number]    Aug 15, 1945  Primary Care Physician:O'Sullivan, Lenna Sciara, NP  Referring Physician: Debbrah Alar, NP [address] [place],  [place] [address]   Chief complaint:  Colon cancer screening  HPI:  75 yr old very pleasant female with family history of colon cancer in her father and personal history of colon adenomatous polyps here to discuss surveillance colonoscopy  Denies any nausea, vomiting, abdominal pain, melena or bright red blood per rectum  Overall she is doing well and has no GI specific complaints  Colonoscopy 02/26/2016: - Severe diverticulosis in the sigmoid colon. There was narrowing of the colon in association with the diverticular opening. There was evidence of diverticular spasm. Peri-diverticular erythema was seen. - Non-bleeding internal hemorrhoids. - The examination was otherwise normal.  Outpatient Encounter Medications as of 04/20/2021  Medication Sig   amLODipine (NORVASC) 2.5 MG tablet Take 1 tablet (2.5 mg total) by mouth daily.   atorvastatin (LIPITOR) 10 MG tablet Take 1 tablet (10 mg total) by mouth daily.   betamethasone dipropionate 0.05 % cream Apply topically 2 (two) times daily.   chlorthalidone (HYGROTON) 25 MG tablet Take 1 tablet (25 mg total) by mouth daily.   Cholecalciferol (VITAMIN D) 50 MCG (2000 UT) tablet    Multiple Vitamins-Minerals (MULTIVITAMIN WITH MINERALS) tablet Take 1 tablet by mouth daily.   Omega-3 Fatty Acids ([employer] FISH OIL) 1200 MG CAPS Take 1,200 mg by mouth daily.   polycarbophil (FIBERCON) 625 MG tablet Take 625 mg by mouth daily.   Propylene Glycol-Glycerin 0.6-0.6 % SOLN Apply 2 drops to eye daily as needed (dry eyes).   Turmeric 500 MG CAPS Take 500 mg by mouth daily.   [DISCONTINUED] COVID-19 mRNA bivalent vaccine, Pfizer, (PFIZER COVID-19 VAC BIVALENT) injection Inject into the muscle.   [DISCONTINUED] COVID-19 mRNA Vac-TriS, Pfizer, SUSP injection Inject  into the muscle.   [DISCONTINUED] Zoster Vaccine Adjuvanted [facility]) injection Inject 0.5mg  IM now and again in 2-6 months.   No facility-administered encounter medications on file as of 04/20/2021.    Allergies as of 04/20/2021 - Review Complete 04/20/2021  Allergen Reaction Noted   Codeine Other (See Comments) 09/10/2007   Erythromycin Other (See Comments) 09/10/2007   Influenza vaccines Other (See Comments) 08/23/2014   Prednisone Other (See Comments) 09/10/2007   Tetracyclines & related Other (See Comments) 12/27/2010    Past Medical History:  Diagnosis Date   Allergy    spring seasonal   Arthritis    hip    Borderline diabetes 12/06/2016   Hyperlipidemia 10/21/2016   Hypertension     Past Surgical History:  Procedure Laterality Date   COLONOSCOPY     POLYPECTOMY     TOTAL HIP ARTHROPLASTY 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/2016   Procedure: RIGHT TOTAL HIP ARTHROPLASTY ANTERIOR APPROACH;  Surgeon: Paralee Cancel, MD;  Location: [facility];  Service: Orthopedics;  Laterality: Right;    Family History  Problem Relation Age of Onset   Stomach cancer Mother        died at 74   Colon cancer Father        died at 45   Colon polyps Neg Hx    Rectal cancer Neg Hx    Esophageal cancer Neg Hx    Pancreatic cancer Neg Hx    Liver disease Neg Hx     Social History   Socioeconomic History   Marital status: Widowed    Spouse name:  Not on file   Number of children: Not on file   Years of education: Not on file   Highest education level: Not on file  Occupational History   Occupation: retired  Tobacco Use   Smoking status: Former   Smokeless tobacco: Never  Substance and Sexual Activity   Alcohol use: Yes    Alcohol/week: 6.0 standard drinks    Types: 6 Glasses of wine per week    Comment: wine, 1-2 glasses every other day   Drug use: No   Sexual activity: Not on file  Other Topics Concern   Not on file  Social History Narrative   2 sons, one in [place] and one in [place].     Retired from [employer]   Enjoys going to the Y 3 times a week   Active in her church   Husband died in Aug 30, 2012   Social Determinants of Health   Financial Resource Strain: Low Risk    Difficulty of Paying Living Expenses: Not hard at [employer] Insecurity: No Food Insecurity   Worried About Charity fundraiser in the Last Year: Never true   Arboriculturist in the Last Year: Never true  Transportation Needs: No Transportation Needs   Lack of Transportation (Medical): No   Lack of Transportation (Non-Medical): No  Physical Activity: Sufficiently Active   Days of Exercise per Week: 3 days   Minutes of Exercise per Session: 60 min  Stress: No Stress Concern Present   Feeling of Stress : Not at all  Social Connections: Moderately Integrated   Frequency of Communication with Friends and Family: More than three times a week   Frequency of Social Gatherings with Friends and Family: More than three times a week   Attends Religious Services: 1 to 4 times per year   Active Member of [employer] or Organizations: Yes   Attends Archivist Meetings: 1 to 4 times per year   Marital Status: Widowed  Human resources officer Violence: Not At Risk   Fear of Current or Ex-Partner: No   Emotionally Abused: No   Physically Abused: No   Sexually Abused: No      Review of systems: All other review of systems negative except as mentioned in the HPI.   Physical Exam: Vitals:   04/20/21 1348  BP: (!) 160/68  Pulse: 76   Body mass index is 28.94 kg/m. Gen:      No acute distress HEENT:  sclera anicteric Abd:      soft, non-tender; no palpable masses, no distension Ext:    No edema Neuro: alert and oriented x 3 Psych: normal mood and affect  Data Reviewed:  Reviewed labs, radiology imaging, old records and pertinent past GI work up   Assessment and Plan/Recommendations:  75 year old very pleasant female here to discuss colonoscopy for surveillance, she is high risk for colorectal  cancer with positive family history for colon cancer in her father and personal history of adenomatous colon polyps We will plan to proceed with colonoscopy for surveillance The risks and benefits as well as alternatives of endoscopic procedure(s) have been discussed and reviewed. All questions answered. The patient agrees to proceed.   The patient was provided an opportunity to ask questions and all were answered. The patient agreed with the plan and demonstrated an understanding of the instructions.  Damaris Hippo , MD    CC: Debbrah Alar, NP

## 2021-05-21 ENCOUNTER — Other Ambulatory Visit: Payer: Self-pay

## 2021-05-21 ENCOUNTER — Encounter: Payer: Self-pay | Admitting: Gastroenterology

## 2021-05-21 ENCOUNTER — Ambulatory Visit (AMBULATORY_SURGERY_CENTER): Payer: Medicare Other | Admitting: Gastroenterology

## 2021-05-21 VITALS — BP 166/84 | HR 63 | Temp 97.8°F | Resp 24 | Ht 67.0 in | Wt 184.0 lb

## 2021-05-21 DIAGNOSIS — D122 Benign neoplasm of ascending colon: Secondary | ICD-10-CM

## 2021-05-21 DIAGNOSIS — D120 Benign neoplasm of cecum: Secondary | ICD-10-CM | POA: Diagnosis not present

## 2021-05-21 DIAGNOSIS — Z86010 Personal history of colonic polyps: Secondary | ICD-10-CM | POA: Diagnosis not present

## 2021-05-21 DIAGNOSIS — D123 Benign neoplasm of transverse colon: Secondary | ICD-10-CM | POA: Diagnosis not present

## 2021-05-21 DIAGNOSIS — Z800 Family history of malignant neoplasm of digestive organs: Secondary | ICD-10-CM

## 2021-05-21 MED ORDER — SODIUM CHLORIDE 0.9 % IV SOLN: 500.0000 mL | Freq: Once | INTRAVENOUS | Status: DC

## 2021-05-21 NOTE — Progress Notes (Signed)
[facility] History and Physical   Primary Care Physician:  Debbrah Alar, NP   Reason for Procedure:  Family history of colon cancer and personal h/o colon polyps  Plan:    Surveillance colonoscopy with possible interventions as needed     HPI: Patricia Lee is a very pleasant 75 y.o. female here for surveillance colonoscopy. Denies any nausea, vomiting, abdominal pain, melena or bright red blood per rectum  The risks and benefits as well as alternatives of endoscopic procedure(s) have been discussed and reviewed. All questions answered. The patient agrees to proceed.    Past Medical History:  Diagnosis Date   Allergy    spring seasonal   Arthritis    hip    Borderline diabetes 12/06/2016   Hyperlipidemia 10/21/2016   Hypertension     Past Surgical History:  Procedure Laterality Date   COLONOSCOPY     POLYPECTOMY     TOTAL HIP ARTHROPLASTY Right 04/09/2016   Procedure: RIGHT TOTAL HIP ARTHROPLASTY ANTERIOR APPROACH;  Surgeon: Paralee Cancel, MD;  Location: [facility];  Service: Orthopedics;  Laterality: Right;    Prior to Admission medications   Medication Sig Start Date End Date Taking? Authorizing Provider  amLODipine (NORVASC) 2.5 MG tablet Take 1 tablet (2.5 mg total) by mouth daily. 02/13/21  Yes Debbrah Alar, NP  atorvastatin (LIPITOR) 10 MG tablet Take 1 tablet (10 mg total) by mouth daily. 02/13/21  Yes Debbrah Alar, NP  chlorthalidone (HYGROTON) 25 MG tablet Take 1 tablet (25 mg total) by mouth daily. 02/13/21  Yes Debbrah Alar, NP  Cholecalciferol (VITAMIN D) 50 MCG (2000 UT) tablet    Yes [provider]  Multiple Vitamins-Minerals (MULTIVITAMIN WITH MINERALS) tablet Take 1 tablet by mouth daily.   Yes [provider]  Omega-3 Fatty Acids (CVS FISH OIL) 1200 MG CAPS Take 1,200 mg by mouth daily.   Yes [provider]  polycarbophil (FIBERCON) 625 MG tablet Take 625 mg by mouth daily.   Yes [provider]  Turmeric 500 MG CAPS Take 500 mg by mouth daily.   Yes [provider]  betamethasone dipropionate 0.05 % cream Apply topically 2 (two) times daily. 08/08/20   Debbrah Alar, NP  Propylene Glycol-Glycerin 0.6-0.6 % SOLN Apply 2 drops to eye daily as needed (dry eyes).    [provider]    Current Outpatient Medications  Medication Sig Dispense Refill   amLODipine (NORVASC) 2.5 MG tablet Take 1 tablet (2.5 mg total) by mouth daily. 90 tablet 1   atorvastatin (LIPITOR) 10 MG tablet Take 1 tablet (10 mg total) by mouth daily. 90 tablet 1   chlorthalidone (HYGROTON) 25 MG tablet Take 1 tablet (25 mg total) by mouth daily. 90 tablet 1   Cholecalciferol (VITAMIN D) 50 MCG (2000 UT) tablet      Multiple Vitamins-Minerals (MULTIVITAMIN WITH MINERALS) tablet Take 1 tablet by mouth daily.     Omega-3 Fatty Acids (CVS FISH OIL) 1200 MG CAPS Take 1,200 mg by mouth daily.     polycarbophil (FIBERCON) 625 MG tablet Take 625 mg by mouth daily.     Turmeric 500 MG CAPS Take 500 mg by mouth daily.     betamethasone dipropionate 0.05 % cream Apply topically 2 (two) times daily. 30 g 0   Propylene Glycol-Glycerin 0.6-0.6 % SOLN Apply 2 drops to eye daily as needed (dry eyes).     Current Facility-Administered Medications  Medication Dose Route Frequency Provider Last Rate Last Admin   0.9 %  sodium chloride infusion  500 mL Intravenous Once Mauri Pole, MD        Allergies as of 05/21/2021 - Review Complete 05/21/2021  Allergen Reaction Noted   Codeine Other (See Comments) 09/10/2007   Erythromycin Other (See Comments) 09/10/2007   Influenza vaccines Other (See Comments) 08/23/2014   Prednisone Other (See Comments) 09/10/2007   Tetracyclines & related Other (See Comments) 12/27/2010    Family History  Problem Relation Age of Onset   Stomach cancer Mother        died at 66   Colon cancer Father        died at 50   Colon polyps Neg Hx    Rectal cancer  Neg Hx    Esophageal cancer Neg Hx    Pancreatic cancer Neg Hx    Liver disease Neg Hx     Social History   Socioeconomic History   Marital status: Widowed    Spouse name: Not on file   Number of children: Not on file   Years of education: Not on file   Highest education level: Not on file  Occupational History   Occupation: retired  Tobacco Use   Smoking status: Former   Smokeless tobacco: Never  Substance and Sexual Activity   Alcohol use: Yes    Alcohol/week: 6.0 standard drinks    Types: 6 Glasses of wine per week    Comment: wine, 1-2 glasses every other day   Drug use: No   Sexual activity: Not on file  Other Topics Concern   Not on file  Social History Narrative   2 sons, one in [place] and one in [place].    Retired from [employer]   Enjoys going to the Y 3 times a week   Active in her church   Husband died in 09/14/12   Social Determinants of Health   Financial Resource Strain: Low Risk    Difficulty of Paying Living Expenses: Not hard at [employer] Insecurity: No Food Insecurity   Worried About Charity fundraiser in the Last Year: Never true   Arboriculturist in the Last Year: Never true  Transportation Needs: No Transportation Needs   Lack of Transportation (Medical): No   Lack of Transportation (Non-Medical): No  Physical Activity: Sufficiently Active   Days of Exercise per Week: 3 days   Minutes of Exercise per Session: 60 min  Stress: No Stress Concern Present   Feeling of Stress : Not at all  Social Connections: Moderately Integrated   Frequency of Communication with Friends and Family: More than three times a week   Frequency of Social Gatherings with Friends and Family: More than three times a week   Attends Religious Services: 1 to 4 times per year   Active Member of [employer] or Organizations: Yes   Attends Archivist Meetings: 1 to 4 times per year   Marital Status: Widowed  Human resources officer Violence: Not At Risk   Fear  of Current or Ex-Partner: No   Emotionally Abused: No   Physically Abused: No   Sexually Abused: No    Review of Systems:  All other review of systems negative except as mentioned in the HPI.  Physical Exam: Vital signs in last 24 hours: BP (!) 172/72   Pulse 68   Temp 97.8 F (36.6 C)   Ht 5\' 7"  (1.702 m)   Wt 184 lb (83.5 kg)   SpO2 96%   BMI 28.82  kg/m  General:   Alert, NAD Lungs:  Clear .   Heart:  Regular rate and rhythm Abdomen:  Soft, nontender and nondistended. Neuro/Psych:  Alert and cooperative. Normal mood and affect. A and O x 3  Reviewed labs, radiology imaging, old records and pertinent past GI work up  Patient is appropriate for planned procedure(s) and anesthesia in an ambulatory setting   K. Denzil Magnuson , MD [phone]

## 2021-05-21 NOTE — Op Note (Signed)
[facility] Patient Name: Patricia Lee Procedure Date: 05/21/2021 11:11 AM MRN: [id number] Endoscopist: Mauri Pole , MD Age: 75 Referring MD:  Date of Birth: 09-21-1945 Gender: Female Account #: [id number] Procedure:                Colonoscopy Indications:              Screening in patient at increased risk: Family                            history of 1st-degree relative with colorectal                            cancer, High risk colon cancer surveillance:                            Personal history of colonic polyps Medicines:                Monitored Anesthesia Care Procedure:                Pre-Anesthesia Assessment:                           - Prior to the procedure, a History and Physical                            was performed, and patient medications and                            allergies were reviewed. The patient's tolerance of                            previous anesthesia was also reviewed. The risks                            and benefits of the procedure and the sedation                            options and risks were discussed with the patient.                            All questions were answered, and informed consent                            was obtained. Prior Anticoagulants: The patient has                            taken no previous anticoagulant or antiplatelet                            agents. ASA Grade Assessment: II - A patient with                            mild systemic disease. After reviewing the risks  and benefits, the patient was deemed in                            satisfactory condition to undergo the procedure.                           After obtaining informed consent, the colonoscope                            was passed under direct vision. Throughout the                            procedure, the patient's blood pressure, pulse, and                            oxygen saturations were monitored  continuously. The                            PCF-HQ190L Colonoscope was introduced through the                            anus and advanced to the the cecum, identified by                            appendiceal orifice and ileocecal valve. The                            colonoscopy was performed without difficulty. The                            patient tolerated the procedure well. The quality                            of the bowel preparation was adequate. The                            ileocecal valve, appendiceal orifice, and rectum                            were photographed. Scope In: 11:23:07 AM Scope Out: 11:41:59 AM Scope Withdrawal Time: 0 hours 13 minutes 18 seconds  Total Procedure Duration: 0 hours 18 minutes 52 seconds  Findings:                 The perianal and digital rectal examinations were                            normal.                           A 1 mm polyp was found in the cecum. The polyp was                            sessile. The polyp was removed with a cold biopsy  forceps. Resection and retrieval were complete.                           Three sessile polyps were found in the transverse                            colon and ascending colon. The polyps were 5 to 10                            mm in size. These polyps were removed with a cold                            snare. Resection and retrieval were complete.                           Multiple small and large-mouthed diverticula were                            found in the sigmoid colon. There was narrowing of                            the colon in association with the diverticular                            opening.                           Non-bleeding external and internal hemorrhoids were                            found during retroflexion. The hemorrhoids were                            medium-sized. Complications:            No immediate complications. Estimated Blood  Loss:     Estimated blood loss was minimal. Impression:               - One 1 mm polyp in the cecum, removed with a cold                            biopsy forceps. Resected and retrieved.                           - Three 5 to 10 mm polyps in the transverse colon                            and in the ascending colon, removed with a cold                            snare. Resected and retrieved.                           - Moderate diverticulosis in the sigmoid colon.  There was narrowing of the colon in association                            with the diverticular opening.                           - Non-bleeding external and internal hemorrhoids. Recommendation:           - Patient has a contact number available for                            emergencies. The signs and symptoms of potential                            delayed complications were discussed with the                            patient. Return to normal activities tomorrow.                            Written discharge instructions were provided to the                            patient.                           - Resume previous diet.                           - Continue present medications.                           - Await pathology results.                           - No repeat colonoscopy due to age.                           - Return to GI clinic PRN. Mauri Pole, MD 05/21/2021 11:48:54 AM This report has been signed electronically.

## 2021-05-21 NOTE — Progress Notes (Signed)
Pt's states no medical or surgical changes since previsit or office visit. VS by CW. 

## 2021-05-21 NOTE — Progress Notes (Signed)
Called to room to assist during endoscopic procedure.  Patient ID and intended procedure confirmed with present staff. Received instructions for my participation in the procedure from the performing physician.  

## 2021-05-21 NOTE — Progress Notes (Signed)
Sedate, gd SR, tolerated procedure well, VSS, report to RN 

## 2021-05-21 NOTE — Patient Instructions (Signed)
.  Handout on polyps, diverticulosis given.    YOU HAD AN ENDOSCOPIC PROCEDURE TODAY AT THE [facility]:   Refer to the procedure report that was given to you for any specific questions about what was found during the examination.  If the procedure report does not answer your questions, please call your gastroenterologist to clarify.  If you requested that your care partner not be given the details of your procedure findings, then the procedure report has been included in a sealed envelope for you to review at your convenience later.  YOU SHOULD EXPECT: Some feelings of bloating in the abdomen. Passage of more gas than usual.  Walking can help get rid of the air that was put into your GI tract during the procedure and reduce the bloating. If you had a lower endoscopy (such as a colonoscopy or flexible sigmoidoscopy) you may notice spotting of blood in your stool or on the toilet paper. If you underwent a bowel prep for your procedure, you may not have a normal bowel movement for a few days.  Please Note:  You might notice some irritation and congestion in your nose or some drainage.  This is from the oxygen used during your procedure.  There is no need for concern and it should clear up in a day or so.  SYMPTOMS TO REPORT IMMEDIATELY:   Following lower endoscopy (colonoscopy or flexible sigmoidoscopy):  Excessive amounts of blood in the stool  Significant tenderness or worsening of abdominal pains  Swelling of the abdomen that is new, acute  Fever of 100F or higher   For urgent or emergent issues, a gastroenterologist can be reached at any hour by calling [phone]. Do not use MyChart messaging for urgent concerns.    DIET:  We do recommend a small meal at first, but then you may proceed to your regular diet.  Drink plenty of fluids but you should avoid alcoholic beverages for 24 hours.  ACTIVITY:  You should plan to take it easy for the rest of today and you should NOT  DRIVE or use heavy machinery until tomorrow (because of the sedation medicines used during the test).    FOLLOW UP: Our staff will call the number listed on your records 48-72 hours following your procedure to check on you and address any questions or concerns that you may have regarding the information given to you following your procedure. If we do not reach you, we will leave a message.  We will attempt to reach you two times.  During this call, we will ask if you have developed any symptoms of COVID 19. If you develop any symptoms (ie: fever, flu-like symptoms, shortness of breath, cough etc.) before then, please call [phone].  If you test positive for Covid 19 in the 2 weeks post procedure, please call and report this information to us.    If any biopsies were taken you will be contacted by phone or by letter within the next 1-3 weeks.  Please call us at [phone] if you have not heard about the biopsies in 3 weeks.    SIGNATURES/CONFIDENTIALITY: You and/or your care partner have signed paperwork which will be entered into your electronic medical record.  These signatures attest to the fact that that the information above on your After Visit Summary has been reviewed and is understood.  Full responsibility of the confidentiality of this discharge information lies with you and/or your care-partner. 

## 2021-05-23 ENCOUNTER — Telehealth: Payer: Self-pay | Admitting: *Deleted

## 2021-05-23 NOTE — Telephone Encounter (Signed)
°  Follow up Call-  Call back number 05/21/2021  Post procedure Call Back phone  # [phone]  Permission to leave phone message Yes  Some recent data might be hidden     Patient questions:  Do you have a fever, pain , or abdominal swelling? No. Pain Score  0 *  Have you tolerated food without any problems? Yes.    Have you been able to return to your normal activities? Yes.    Do you have any questions about your discharge instructions: Diet   No. Medications  No. Follow up visit  No.  Do you have questions or concerns about your Care? No. -pt reported a small amount of discharge with blood after she returned home from the procedure. This has resolved. RN instructed that this was likely from the polyp removal during the procedure. Pt is back to normal now.  Actions: * If pain score is 4 or above: No action needed, pain <4.  Have you developed a fever since your procedure? no  2.   Have you had an respiratory symptoms (SOB or cough) since your procedure? no  3.   Have you tested positive for COVID 19 since your procedure no  4.   Have you had any family members/close contacts diagnosed with the COVID 19 since your procedure?  no   If yes to any of these questions please route to Joylene John, RN and Joella Prince, RN

## 2021-05-25 ENCOUNTER — Encounter: Payer: Self-pay | Admitting: Gastroenterology

## 2021-06-27 DIAGNOSIS — S63502A Unspecified sprain of left wrist, initial encounter: Secondary | ICD-10-CM | POA: Diagnosis not present

## 2021-08-14 ENCOUNTER — Ambulatory Visit (INDEPENDENT_AMBULATORY_CARE_PROVIDER_SITE_OTHER): Payer: Medicare Other | Admitting: Family

## 2021-08-14 VITALS — BP 148/74 | HR 81 | Temp 98.7°F | Resp 16 | Ht 67.0 in | Wt 179.0 lb

## 2021-08-14 DIAGNOSIS — I10 Essential (primary) hypertension: Secondary | ICD-10-CM | POA: Diagnosis not present

## 2021-08-14 DIAGNOSIS — E785 Hyperlipidemia, unspecified: Secondary | ICD-10-CM

## 2021-08-14 DIAGNOSIS — Z85828 Personal history of other malignant neoplasm of skin: Secondary | ICD-10-CM

## 2021-08-14 DIAGNOSIS — M25512 Pain in left shoulder: Secondary | ICD-10-CM | POA: Diagnosis not present

## 2021-08-14 DIAGNOSIS — R7303 Prediabetes: Secondary | ICD-10-CM

## 2021-08-14 DIAGNOSIS — G8929 Other chronic pain: Secondary | ICD-10-CM | POA: Diagnosis not present

## 2021-08-14 DIAGNOSIS — R739 Hyperglycemia, unspecified: Secondary | ICD-10-CM

## 2021-08-14 DIAGNOSIS — E663 Overweight: Secondary | ICD-10-CM

## 2021-08-14 LAB — LIPID PANEL
Cholesterol: 170 mg/dL (ref 0–200)
HDL: 69.5 mg/dL (ref >39.00)
LDL Cholesterol: 78 mg/dL (ref 0–99)
NonHDL: 100.63
Total CHOL/HDL Ratio: 2
Triglycerides: 114 mg/dL (ref 0.0–149.0)
VLDL: 22.8 mg/dL (ref 0.0–40.0)

## 2021-08-14 LAB — COMPREHENSIVE METABOLIC PANEL
ALT: 11 U/L (ref 0–35)
AST: 17 U/L (ref 0–37)
Albumin: 4.2 g/dL (ref 3.5–5.2)
Alkaline Phosphatase: 85 U/L (ref 39–117)
BUN: 19 mg/dL (ref 6–23)
CO2: 32 mEq/L (ref 19–32)
Calcium: 9.7 mg/dL (ref 8.4–10.5)
Chloride: 97 mEq/L (ref 96–112)
Creatinine, Ser: 0.9 mg/dL (ref 0.40–1.20)
GFR: 62.28 mL/min (ref >60.00)
Glucose, Bld: 96 mg/dL (ref 70–99)
Potassium: 3.9 mEq/L (ref 3.5–5.1)
Sodium: 136 mEq/L (ref 135–145)
Total Bilirubin: 0.9 mg/dL (ref 0.2–1.2)
Total Protein: 7.1 g/dL (ref 6.0–8.3)

## 2021-08-14 LAB — HEMOGLOBIN A1C: Hgb A1c MFr Bld: 6.3 % (ref 4.6–6.5)

## 2021-08-14 MED ORDER — AMLODIPINE BESYLATE 2.5 MG PO TABS: 2.5000 mg | ORAL_TABLET | Freq: Every day | ORAL | 1 refills | Status: DC

## 2021-08-14 MED ORDER — ATORVASTATIN CALCIUM 10 MG PO TABS: 10.0000 mg | ORAL_TABLET | Freq: Every day | ORAL | 1 refills | Status: DC

## 2021-08-14 MED ORDER — CHLORTHALIDONE 25 MG PO TABS: 25.0000 mg | ORAL_TABLET | Freq: Every day | ORAL | 1 refills | Status: DC

## 2021-08-14 NOTE — Assessment & Plan Note (Signed)
Continues annual follow up with Dermatology.  ?

## 2021-08-14 NOTE — Patient Instructions (Signed)
Please complete lab work prior to leaving.   

## 2021-08-14 NOTE — Assessment & Plan Note (Signed)
Lab Results  ?Component Value Date  ? HGBA1C 6.1 02/13/2021  ? HGBA1C 6.1 08/08/2020  ? HGBA1C 5.9 (H) 02/01/2020  ? ?Lab Results  ?Component Value Date  ? MICROALBUR <0.7 08/08/2020  ? [place] 87 08/08/2020  ? CREATININE 0.97 02/13/2021  ? ? ?

## 2021-08-14 NOTE — Assessment & Plan Note (Signed)
BP Readings from Last 3 Encounters:  ?08/14/21 (!) 148/74  ?05/21/21 (!) 166/84  ?04/20/21 (!) 160/68  ? ?Fair control. Acceptable for her age.  Continue amlodipine 2.'5mg'$  once daily.  ?

## 2021-08-14 NOTE — Assessment & Plan Note (Signed)
Wt Readings from Last 3 Encounters:  ?08/14/21 179 lb (81.2 kg)  ?05/21/21 184 lb (83.5 kg)  ?04/20/21 184 lb 12.8 oz (83.8 kg)  ? ?Weight is down some.  ?

## 2021-08-14 NOTE — Assessment & Plan Note (Signed)
Pain is resolved.  Monitor.  ?

## 2021-08-14 NOTE — Assessment & Plan Note (Signed)
Lab Results  ?Component Value Date  ? CHOL 186 08/08/2020  ? HDL 78.90 08/08/2020  ? [place] 87 08/08/2020  ? LDLDIRECT 136.2 07/21/2012  ? TRIG 99.0 08/08/2020  ? CHOLHDL 2 08/08/2020  ? ?Tolerating lipitor/fish oil. Continue same. ?

## 2021-08-14 NOTE — Progress Notes (Signed)
Subjective:   By signing my name below, I, Carylon Perches, attest that this documentation has been prepared under the direction and in the presence of Debbrah Alar NP, 08/14/2021    Patient ID: Patricia Lee, female    DOB: Aug 25, 1945, 76 y.o.   MRN: [id number]  Chief Complaint  Patient presents with   Hypertension    Here for follow up   URI    Complains of cold symptome, cough and congestion    HPI Patient is in today for an office visit.  Cough/Cold - Patient has a cold and cough that has been improving lately. She took a COVID - 19 test and reported that the results are negative.  Blood Pressure - Patient is continuing to take 2.5 MG of Norvasc. She states that as of lately her blood pressure has been inconsistent. She believes that this could be due to taking OTC cough and cold medications. BP Readings from Last 3 Encounters:  08/14/21 (!) 148/74  05/21/21 (!) 166/84  04/20/21 (!) 160/68   Left Shoulder Pain - Patient no longer has left shoulder pain.  Refill - Patient requests refills on Norvasc, Lipitor and [place].   Skin - Patient is regularly seeing a dermatologist.  Cholesterol - Patient is continuing to take 10 MG of Lipitor. Lab Results  Component Value Date   CHOL 186 08/08/2020   HDL 78.90 08/08/2020   LDLCALC 87 08/08/2020   LDLDIRECT 136.2 07/21/2012   TRIG 99.0 08/08/2020   CHOLHDL 2 08/08/2020   Diet - Patient's weight has decreased. She believes this is due to not eating as often because of her recent cough and cold. Wt Readings from Last 3 Encounters:  08/14/21 179 lb (81.2 kg)  05/21/21 184 lb (83.5 kg)  04/20/21 184 lb 12.8 oz (83.8 kg)   Immunizations - Patient has not taken the recent flu vaccination. She does not want to receive the flu vaccination. She is hesitant about receiving the Shingrix vaccination.   Health Maintenance Due  Topic Date Due   Hepatitis C Screening  Never done   Zoster Vaccines- Shingrix (1 of 2) Never done    URINE MICROALBUMIN  08/08/2021    Past Medical History:  Diagnosis Date   Allergy    spring seasonal   Arthritis    hip    Borderline diabetes 12/06/2016   Hyperlipidemia 10/21/2016   Hypertension     Past Surgical History:  Procedure Laterality Date   COLONOSCOPY     POLYPECTOMY     TOTAL HIP ARTHROPLASTY Right 04/09/2016   Procedure: RIGHT TOTAL HIP ARTHROPLASTY ANTERIOR APPROACH;  Surgeon: Paralee Cancel, MD;  Location: [facility];  Service: Orthopedics;  Laterality: Right;    Family History  Problem Relation Age of Onset   Stomach cancer Mother        died at 10   Colon cancer Father        died at 67   Colon polyps Neg Hx    Rectal cancer Neg Hx    Esophageal cancer Neg Hx    Pancreatic cancer Neg Hx    Liver disease Neg Hx     Social History   Socioeconomic History   Marital status: Widowed    Spouse name: Not on file   Number of children: Not on file   Years of education: Not on file   Highest education level: Not on file  Occupational History   Occupation: retired  Tobacco Use   Smoking status: Former  Smokeless tobacco: Never  Substance and Sexual Activity   Alcohol use: Yes    Alcohol/week: 6.0 standard drinks    Types: 6 Glasses of wine per week    Comment: wine, 1-2 glasses every other day   Drug use: No   Sexual activity: Not on file  Other Topics Concern   Not on file  Social History Narrative   2 sons, one in [place] and one in [place].    Retired from [employer]   [place] [facility] a [place]   Active in her church   Husband died in 07-27-12   Social Determinants of Health   Financial Resource Strain: Not on [employer] Insecurity: Not on file  Transportation Needs: Not on file  Physical Activity: Not on file  Stress: Not on file  Social Connections: Not on file  Intimate Partner Violence: Not on file    Outpatient Medications Prior to Visit  Medication Sig Dispense Refill   betamethasone dipropionate 0.05 %  cream Apply topically 2 (two) times daily. 30 g 0   Cholecalciferol (VITAMIN D) 50 MCG (2000 UT) tablet      Multiple Vitamins-Minerals (MULTIVITAMIN WITH MINERALS) tablet Take 1 tablet by mouth daily.     Omega-3 Fatty Acids ([employer] FISH OIL) 1200 MG CAPS Take 1,200 mg by mouth daily.     polycarbophil (FIBERCON) 625 MG tablet Take 625 mg by mouth daily.     Propylene Glycol-Glycerin 0.6-0.6 % SOLN Apply 2 drops to eye daily as needed (dry eyes).     Turmeric 500 MG CAPS Take 500 mg by mouth daily.     amLODipine (NORVASC) 2.5 MG tablet Take 1 tablet (2.5 mg total) by mouth daily. 90 tablet 1   atorvastatin (LIPITOR) 10 MG tablet Take 1 tablet (10 mg total) by mouth daily. 90 tablet 1   chlorthalidone (HYGROTON) 25 MG tablet Take 1 tablet (25 mg total) by mouth daily. 90 tablet 1   No facility-administered medications prior to visit.    Allergies  Allergen Reactions   Codeine Other (See Comments)    Tachycardia and insomnia   Erythromycin Other (See Comments)    Tachycardia and insomnia   Influenza Vaccines Other (See Comments)    Nerve reaction similar to shingles- all nerves in back on fire    Prednisone Other (See Comments)    Tachycardia and insomnia   Tetracyclines & Related Other (See Comments)    Tachycardia/insomnia    Review of Systems  Constitutional:        (+) Cold  Respiratory:  Positive for cough.       Objective:    Physical Exam Constitutional:      General: She is not in acute distress.    Appearance: Normal appearance. She is not ill-appearing.  HENT:     Head: Normocephalic and atraumatic.     Right Ear: External ear normal.     Left Ear: External ear normal.  Eyes:     Extraocular Movements: Extraocular movements intact.     Pupils: Pupils are equal, round, and reactive to light.  Cardiovascular:     Rate and Rhythm: Normal rate and regular rhythm.     Heart sounds: Normal heart sounds. No murmur heard.   No gallop.  Pulmonary:     Effort:  Pulmonary effort is normal. No respiratory distress.     Breath sounds: Normal breath sounds. No wheezing or rales.  Skin:    General: Skin  is warm and dry.  Neurological:     Mental Status: She is alert and oriented to person, place, and time.  Psychiatric:        Judgment: Judgment normal.    BP (!) 148/74 (BP Location: Left Arm, Patient Position: Sitting, Cuff Size: Small)    Pulse 81    Temp 98.7 F (37.1 C) (Oral)    Resp 16    Ht _0  (1.702 m)    Wt 179 lb (81.2 kg)    SpO2 98%    BMI 28.04 kg/m  Wt Readings from Last 3 Encounters:  08/14/21 179 lb (81.2 kg)  05/21/21 184 lb (83.5 kg)  04/20/21 184 lb 12.8 oz (83.8 kg)       Assessment & Plan:   Problem List Items Addressed This Visit       Unprioritized   Overweight (BMI 25.0-29.9)    Wt Readings from Last 3 Encounters:  08/14/21 179 lb (81.2 kg)  05/21/21 184 lb (83.5 kg)  04/20/21 184 lb 12.8 oz (83.8 kg)  Weight is down some.       Hyperlipidemia - Primary    Lab Results  Component Value Date   CHOL 186 08/08/2020   HDL 78.90 08/08/2020   LDLCALC 87 08/08/2020   LDLDIRECT 136.2 07/21/2012   TRIG 99.0 08/08/2020   CHOLHDL 2 08/08/2020  Tolerating lipitor/fish oil. Continue same.      Relevant Medications   atorvastatin (LIPITOR) 10 MG tablet   amLODipine (NORVASC) 2.5 MG tablet   chlorthalidone (HYGROTON) 25 MG tablet   Other Relevant Orders   Comp Met (CMET)   Lipid panel   Hyperglycemia   Relevant Orders   Hemoglobin A1c   History of skin cancer    Continues annual follow up with Dermatology.       Essential hypertension    BP Readings from Last 3 Encounters:  08/14/21 (!) 148/74  05/21/21 (!) 166/84  04/20/21 (!) 160/68  Fair control. Acceptable for her age.  Continue amlodipine 2.23m once daily.       Relevant Medications   atorvastatin (LIPITOR) 10 MG tablet   amLODipine (NORVASC) 2.5 MG tablet   chlorthalidone (HYGROTON) 25 MG tablet   Chronic left shoulder pain    Pain is  resolved.  Monitor.       Borderline diabetes    Lab Results  Component Value Date   HGBA1C 6.1 02/13/2021   HGBA1C 6.1 08/08/2020   HGBA1C 5.9 (H) 02/01/2020   Lab Results  Component Value Date   MICROALBUR <0.7 08/08/2020   LDLCALC 87 08/08/2020   CREATININE 0.97 02/13/2021          Meds ordered this encounter  Medications   atorvastatin (LIPITOR) 10 MG tablet    Sig: Take 1 tablet (10 mg total) by mouth daily.    Dispense:  90 tablet    Refill:  1    Requesting 1 year supply    Order Specific Question:   Supervising Provider    Answer:   BPenni HomansA [[id number]]   amLODipine (NORVASC) 2.5 MG tablet    Sig: Take 1 tablet (2.5 mg total) by mouth daily.    Dispense:  90 tablet    Refill:  1    Requesting 1 year supply    Order Specific Question:   Supervising Provider    Answer:   BPenni HomansA [[id number]]   chlorthalidone (HYGROTON) 25 MG tablet    Sig: Take 1 tablet (25  mg total) by mouth daily.    Dispense:  90 tablet    Refill:  1    Requesting 1 year supply    Order Specific Question:   Supervising Provider    Answer:   Penni Homans A [[id number]]    I, Nance Pear, NP, personally preformed the services described in this documentation.  All medical record entries made by the scribe were at my direction and in my presence.  I have reviewed the chart and discharge instructions (if applicable) and agree that the record reflects my personal performance and is accurate and complete. 08/14/2021   I,Amber Collins,acting as a scribe for Nance Pear, NP.,have documented all relevant documentation on the behalf of Nance Pear, NP,as directed by  Nance Pear, NP while in the presence of Nance Pear, NP.    Nance Pear, NP

## 2021-08-30 ENCOUNTER — Telehealth: Payer: Self-pay | Admitting: Family

## 2021-08-30 NOTE — Telephone Encounter (Signed)
Left message for patient to call back and schedule Medicare Annual Wellness Visit (AWV) in office.  ? ?If not able to come in office, please offer to do virtually or by telephone.  Left office number and my jabber [phone]. ? ?Last AWV:06/15/2020 ? ?Please schedule at anytime with Nurse Health Advisor. ?  ?

## 2021-09-10 DIAGNOSIS — M545 Low back pain, unspecified: Secondary | ICD-10-CM | POA: Diagnosis not present

## 2021-09-10 DIAGNOSIS — S39012A Strain of muscle, fascia and tendon of lower back, initial encounter: Secondary | ICD-10-CM | POA: Diagnosis not present

## 2021-09-27 ENCOUNTER — Ambulatory Visit (INDEPENDENT_AMBULATORY_CARE_PROVIDER_SITE_OTHER): Payer: Medicare Other

## 2021-09-27 VITALS — Ht 67.0 in | Wt 179.0 lb

## 2021-09-27 DIAGNOSIS — Z0000 Encounter for general adult medical examination without abnormal findings: Secondary | ICD-10-CM

## 2021-09-27 NOTE — Patient Instructions (Signed)
Patricia Lee , ?Thank you for taking time to come for your Medicare Wellness Visit. I appreciate your ongoing commitment to your health goals. Please review the following plan we discussed and let me know if I can assist you in the future.  ? ?Screening recommendations/referrals: ?Colonoscopy: No longer required.  ?Mammogram: Done 12/01/2019 Repeat annually ? ?Bone Density: No longer required.  ? ?Recommended yearly ophthalmology/optometry visit for glaucoma screening and checkup ?Recommended yearly dental visit for hygiene and checkup ? ?Vaccinations: ?Influenza vaccine: Declined due to allergic reaction. ?Pneumococcal vaccine: Done  ?Tdap vaccine: Done 11/17/2008, 08/19/2013 and 01/13/2018 ?Shingles vaccine: Discussed   ?Covid-19:Done 07/07/2019, 07/28/2019, 03/16/2020, 07/17/2020, 12/18/2020 and 03/21/2021. ? ?Advanced directives: Please bring a copy of your health care power of attorney and living will to the office to be added to your chart at your convenience. ? ? ?Conditions/risks identified: KEEP UP THE GOOD WORK!! ? ?Next appointment: Follow up in one year for your annual wellness visit 2024 ? ? ? ?Preventive Care 54 Years and Older, Female ?Preventive care refers to lifestyle choices and visits with your health care provider that can promote health and wellness. ?What does preventive care include? ?A yearly physical exam. This is also called an annual well check. ?Dental exams once or twice a year. ?Routine eye exams. Ask your health care provider how often you should have your eyes checked. ?Personal lifestyle choices, including: ?Daily care of your teeth and gums. ?Regular physical activity. ?Eating a healthy diet. ?Avoiding tobacco and drug use. ?Limiting alcohol use. ?Practicing safe sex. ?Taking low-dose aspirin every day. ?Taking vitamin and mineral supplements as recommended by your health care provider. ?What happens during an annual well check? ?The services and screenings done by your health care provider  during your annual well check will depend on your age, overall health, lifestyle risk factors, and family history of disease. ?Counseling  ?Your health care provider may ask you questions about your: ?Alcohol use. ?Tobacco use. ?Drug use. ?Emotional well-being. ?Home and relationship well-being. ?Sexual activity. ?Eating habits. ?History of falls. ?Memory and ability to understand (cognition). ?Work and work Statistician. ?Reproductive health. ?Screening  ?You may have the following tests or measurements: ?Height, weight, and BMI. ?Blood pressure. ?Lipid and cholesterol levels. These may be checked every 5 years, or more frequently if you are over 33 years old. ?Skin check. ?Lung cancer screening. You may have this screening every year starting at age 13 if you have a 30-pack-year history of smoking and currently smoke or have quit within the past 15 years. ?Fecal occult blood test (FOBT) of the stool. You may have this test every year starting at age 75. ?Flexible sigmoidoscopy or colonoscopy. You may have a sigmoidoscopy every 5 years or a colonoscopy every 10 years starting at age 92. ?Hepatitis C blood test. ?Hepatitis B blood test. ?Sexually transmitted disease (STD) testing. ?Diabetes screening. This is done by checking your blood sugar (glucose) after you have not eaten for a while (fasting). You may have this done every 1-3 years. ?Bone density scan. This is done to screen for osteoporosis. You may have this done starting at age 46. ?Mammogram. This may be done every 1-2 years. Talk to your health care provider about how often you should have regular mammograms. ?Talk with your health care provider about your test results, treatment options, and if necessary, the need for more tests. ?Vaccines  ?Your health care provider may recommend certain vaccines, such as: ?Influenza vaccine. This is recommended every year. ?Tetanus, diphtheria, and  acellular pertussis (Tdap, Td) vaccine. You may need a Td booster every  10 years. ?Zoster vaccine. You may need this after age 4. ?Pneumococcal 13-valent conjugate (PCV13) vaccine. One dose is recommended after age 75. ?Pneumococcal polysaccharide (PPSV23) vaccine. One dose is recommended after age 75. ?Talk to your health care provider about which screenings and vaccines you need and how often you need them. ?This information is not intended to replace advice given to you by your health care provider. Make sure you discuss any questions you have with your health care provider. ?Document Released: 06/23/2015 Document Revised: 02/14/2016 Document Reviewed: 03/28/2015 ?Elsevier Interactive Patient Education ? 2017 [employer]. ? ?Fall Prevention in the Home ?Falls can cause injuries. They can happen to people of all ages. There are many things you can do to make your home safe and to help prevent falls. ?What can I do on the outside of my home? ?Regularly fix the edges of walkways and driveways and fix any cracks. ?Remove anything that might make you trip as you walk through a door, such as a raised step or threshold. ?Trim any bushes or trees on the path to your home. ?Use bright outdoor lighting. ?Clear any walking paths of anything that might make someone trip, such as rocks or tools. ?Regularly check to see if handrails are loose or broken. Make sure that both sides of any steps have handrails. ?Any raised decks and porches should have guardrails on the edges. ?Have any leaves, snow, or ice cleared regularly. ?Use sand or salt on walking paths during winter. ?Clean up any spills in your garage right away. This includes oil or grease spills. ?What can I do in the bathroom? ?Use night lights. ?Install grab bars by the toilet and in the tub and shower. Do not use towel bars as grab bars. ?Use non-skid mats or decals in the tub or shower. ?If you need to sit down in the shower, use a plastic, non-slip stool. ?Keep the floor dry. Clean up any water that spills on the floor as soon as it  happens. ?Remove soap buildup in the tub or shower regularly. ?Attach bath mats securely with double-sided non-slip rug tape. ?Do not have throw rugs and other things on the floor that can make you trip. ?What can I do in the bedroom? ?Use night lights. ?Make sure that you have a light by your bed that is easy to reach. ?Do not use any sheets or blankets that are too big for your bed. They should not hang down onto the floor. ?Have a firm chair that has side arms. You can use this for support while you get dressed. ?Do not have throw rugs and other things on the floor that can make you trip. ?What can I do in the kitchen? ?Clean up any spills right away. ?Avoid walking on wet floors. ?Keep items that you use a lot in easy-to-reach places. ?If you need to reach something above you, use a strong step stool that has a grab bar. ?Keep electrical cords out of the way. ?Do not use floor polish or wax that makes floors slippery. If you must use wax, use non-skid floor wax. ?Do not have throw rugs and other things on the floor that can make you trip. ?What can I do with my stairs? ?Do not leave any items on the stairs. ?Make sure that there are handrails on both sides of the stairs and use them. Fix handrails that are broken or loose. Make sure that  handrails are as long as the stairways. ?Check any carpeting to make sure that it is firmly attached to the stairs. Fix any carpet that is loose or worn. ?Avoid having throw rugs at the top or bottom of the stairs. If you do have throw rugs, attach them to the floor with carpet tape. ?Make sure that you have a light switch at the top of the stairs and the bottom of the stairs. If you do not have them, ask someone to add them for you. ?What else can I do to help prevent falls? ?Wear shoes that: ?Do not have high heels. ?Have rubber bottoms. ?Are comfortable and fit you well. ?Are closed at the toe. Do not wear sandals. ?If you use a stepladder: ?Make sure that it is fully opened.  Do not climb a closed stepladder. ?Make sure that both sides of the stepladder are locked into place. ?Ask someone to hold it for you, if possible. ?Clearly mark and make sure that you can see: ?Any grab

## 2021-09-27 NOTE — Progress Notes (Signed)
? ?Subjective:  ? Patricia Lee is a 76 y.o. female who presents for Medicare Annual (Subsequent) preventive examination. ?Virtual Visit via Telephone Note ? ?I connected with  Renato Shin on 09/27/21 at 11:45 AM EDT by telephone and verified that I am speaking with the correct person using two identifiers. ? ?Location: ?Patient: HOME ?Provider: [facility] ?Persons participating in the virtual visit: patient/Nurse Health Advisor ?  ?I discussed the limitations, risks, security and privacy concerns of performing an evaluation and management service by telephone and the availability of in person appointments. The patient expressed understanding and agreed to proceed. ? ?Interactive audio and video telecommunications were attempted between this nurse and patient, however failed, due to patient having technical difficulties OR patient did not have access to video capability.  We continued and completed visit with audio only. ? ?Some vital signs may be absent or patient reported.  ? ?Chriss Driver, LPN ? ?Review of Systems    ? ?Cardiac Risk Factors include: advanced age (>37mn, >>20women);hypertension;dyslipidemia ? ?   ?Objective:  ?  ?Today's Vitals  ? 09/27/21 1146  ?Weight: 179 lb (81.2 kg)  ?Height: '5\' 7"'$  (1.702 m)  ? ?Body mass index is 28.04 kg/m?. ? ? ?  09/27/2021  ? 11:54 AM 09/28/2020  ?  9:19 PM 06/15/2020  ?  9:03 AM 04/29/2019  ?  9:38 AM 04/09/2016  ? 11:00 AM 04/09/2016  ?  5:24 AM 04/03/2016  ?  2:17 PM  ?Advanced Directives  ?Does Patient Have a Medical Advance Directive? Yes Yes Yes Yes Yes Yes Yes  ?Type of AParamedicof [place] will  [place] will [place] will Healthcare Power of [place]Scottof AGar[place] PointLiving will  ?Does patient want to make changes to medical advance directive?    No - Patient declined No - Patient declined    ?Copy of [place]  Chart? No - copy requested  No - copy requested No - copy requested No - copy requested No - copy requested No - copy requested  ? ? ?Current Medications (verified) ?Outpatient Encounter Medications as of 09/27/2021  ?Medication Sig  ? amLODipine (NORVASC) 2.5 MG tablet Take 1 tablet (2.5 mg total) by mouth daily.  ? atorvastatin (LIPITOR) 10 MG tablet Take 1 tablet (10 mg total) by mouth daily.  ? betamethasone dipropionate 0.05 % cream Apply topically 2 (two) times daily.  ? chlorthalidone (HYGROTON) 25 MG tablet Take 1 tablet (25 mg total) by mouth daily.  ? Cholecalciferol (VITAMIN D) 50 MCG (2000 UT) tablet   ? gabapentin (NEURONTIN) 300 MG capsule Take 300 mg by mouth 3 (three) times daily.  ? Multiple Vitamins-Minerals (MULTIVITAMIN WITH MINERALS) tablet Take 1 tablet by mouth daily.  ? Omega-3 Fatty Acids ([employer] FISH OIL) 1200 MG CAPS Take 1,200 mg by mouth daily.  ? polycarbophil (FIBERCON) 625 MG tablet Take 625 mg by mouth daily.  ? Propylene Glycol-Glycerin 0.6-0.6 % SOLN Apply 2 drops to eye daily as needed (dry eyes).  ? Turmeric 500 MG CAPS Take 500 mg by mouth daily.  ? ?No facility-administered encounter medications on file as of 09/27/2021.  ? ? ?Allergies (verified) ?Codeine, Erythromycin, Influenza vaccines, Prednisone, and Tetracyclines & related  ? ?History: ?Past Medical History:  ?Diagnosis Date  ? Allergy   ? spring seasonal  ? 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 Borderline diabetes 12/06/2016  ? Hyperlipidemia  10/21/2016  ? Hypertension   ? ?Past Surgical History:  ?Procedure Laterality Date  ? COLONOSCOPY    ? JOINT REPLACEMENT  Right hip  ? POLYPECTOMY    ? TOTAL HIP ARTHROPLASTY Right 04/09/2016  ? Procedure: RIGHT TOTAL HIP ARTHROPLASTY ANTERIOR APPROACH;  Surgeon: Paralee Cancel, MD;  Location: [facility];  Service: Orthopedics;  Laterality: Right;  ? ?Family History  ?Problem Relation Age of Onset  ? Stomach cancer Mother   ?     died at 76  ? Arthritis Mother   ? Cancer Mother   ? Colon cancer Father   ?      died at 66  ? Cancer Father   ? Colon polyps Neg Hx   ? Rectal cancer Neg Hx   ? Esophageal cancer Neg Hx   ? Pancreatic cancer Neg Hx   ? Liver disease Neg Hx   ? ?Social History  ? ?Socioeconomic History  ? Marital status: Widowed  ?  Spouse name: Not on file  ? Number of children: Not on file  ? Years of education: Not on file  ? Highest education level: Not on file  ?Occupational History  ? Occupation: retired  ?Tobacco Use  ? Smoking status: Former  ?  Packs/day: 0.25  ?  Years: 20.00  ?  Pack years: 5.00  ?  Types: Cigarettes  ?  Quit date: 06/10/1993  ?  Years since quitting: 28.3  ? Smokeless tobacco: Never  ?Substance and Sexual Activity  ? Alcohol use: Yes  ?  Alcohol/week: 5.0 standard drinks  ?  Types: 5 Glasses of wine per week  ?  Comment: wine, 1-2 glasses every other day  ? Drug use: No  ? Sexual activity: Not Currently  ?  Birth control/protection: Post-menopausal  ?Other Topics Concern  ? Not on file  ?Social History Narrative  ? 2 sons, one in [place] and one in [place].   ? Retired from [employer]  ? Enjoys [facility] to the Y 3 times a week  ? Active in her church  ? Husband died in August 25, 2012  ? ?Social Determinants of Health  ? ?Financial Resource Strain: Low Risk   ? Difficulty of Paying Living Expenses: Not hard at all  ?Food Insecurity: No Food Insecurity  ? Worried About Charity fundraiser in the Last Year: Never true  ? Ran Out of Food in the Last Year: Never true  ?Transportation Needs: No Transportation Needs  ? Lack of Transportation (Medical): No  ? Lack of Transportation (Non-Medical): No  ?Physical Activity: Sufficiently Active  ? Days of Exercise per Week: 3 days  ? Minutes of Exercise per Session: 90 min  ?Stress: No Stress Concern Present  ? Feeling of Stress : Not at all  ?Social Connections: Moderately Integrated  ? Frequency of Communication with Friends and Family: More than three times a week  ? Frequency of Social Gatherings with Friends and Family: More than three  times a week  ? Attends Religious Services: More than 4 times per year  ? Active Member of Clubs or Organizations: Yes  ? Attends Archivist Meetings: More than 4 times per year  ? Marital Status: Widowed  ? ? ?Tobacco Counseling ?Counseling given: Not Answered ? ? ?Clinical Intake: ? ?Pre-visit preparation completed: Yes ? ?Pain : No/denies pain ? ?  ? ?BMI - recorded: 28.04 ?Nutritional Status: BMI 25 -29 Overweight ?Nutritional Risks: None ?Diabetes: No ? ?How often do you need to  have someone help you when you read instructions, pamphlets, or other written materials from your doctor or pharmacy?: 1 - Never ? ?Diabetic?NO ? ?  ? ?Information entered by :: mj Labrian Torregrossa, lpn ? ? ?Activities of Daily Living ? ?  09/27/2021  ? 11:57 AM  ?In your present state of health, do you have any difficulty performing the following activities:  ?Hearing? 0  ?Vision? 0  ?Difficulty concentrating or making decisions? 1  ?Comment memory at times.  ?Walking or climbing stairs? 0  ?Dressing or bathing? 0  ?Doing errands, shopping? 0  ?Preparing Food and eating ? N  ?Using the Toilet? N  ?In the past six months, have you accidently leaked urine? Y  ?Do you have problems with loss of bowel control? Y  ?Managing your Medications? N  ?Managing your Finances? N  ?Housekeeping or managing your Housekeeping? N  ? ? ?Patient Care Team: ?Debbrah Alar, NP as PCP - General (Internal Medicine) ? ?Indicate any recent Medical Services you may have received from other than [facility] providers in the past year (date may be approximate). ? ?   ?Assessment:  ? This is a routine wellness examination for [facility]. ? ?Hearing/Vision screen ?Hearing Screening - Comments:: Some hearing issues.  ?Vision Screening - Comments:: Glasses. Dr. Orvan Seen in [place]. 01/2021. ? ?Dietary issues and exercise activities discussed: ?Current Exercise Habits: Structured exercise class, Type of exercise: Other - see comments ([employer] deep water aerobics and swimming.), Time  (Minutes): 60 (Pt ususally exercises 1.5 hours), Frequency (Times/Week): 3, Weekly Exercise (Minutes/Week): 180, Intensity: Moderate, Exercise limited by: cardiac condition(s) ? ? Goals Addressed   ? ?  ?

## 2021-12-06 DIAGNOSIS — Z1231 Encounter for screening mammogram for malignant neoplasm of breast: Secondary | ICD-10-CM | POA: Diagnosis not present

## 2021-12-06 LAB — HM MAMMOGRAPHY

## 2021-12-18 DIAGNOSIS — Z85828 Personal history of other malignant neoplasm of skin: Secondary | ICD-10-CM | POA: Diagnosis not present

## 2021-12-18 DIAGNOSIS — L814 Other melanin hyperpigmentation: Secondary | ICD-10-CM | POA: Diagnosis not present

## 2021-12-18 DIAGNOSIS — D1801 Hemangioma of skin and subcutaneous tissue: Secondary | ICD-10-CM | POA: Diagnosis not present

## 2021-12-18 DIAGNOSIS — X32XXXS Exposure to sunlight, sequela: Secondary | ICD-10-CM | POA: Diagnosis not present

## 2021-12-18 DIAGNOSIS — D485 Neoplasm of uncertain behavior of skin: Secondary | ICD-10-CM | POA: Diagnosis not present

## 2021-12-18 DIAGNOSIS — L821 Other seborrheic keratosis: Secondary | ICD-10-CM | POA: Diagnosis not present

## 2021-12-18 DIAGNOSIS — E508 Other manifestations of vitamin A deficiency: Secondary | ICD-10-CM | POA: Diagnosis not present

## 2021-12-18 DIAGNOSIS — D2372 Other benign neoplasm of skin of left lower limb, including hip: Secondary | ICD-10-CM | POA: Diagnosis not present

## 2021-12-20 DIAGNOSIS — M654 Radial styloid tenosynovitis [de Quervain]: Secondary | ICD-10-CM | POA: Diagnosis not present

## 2021-12-28 ENCOUNTER — Other Ambulatory Visit: Payer: Self-pay | Admitting: Family

## 2022-01-18 DIAGNOSIS — M654 Radial styloid tenosynovitis [de Quervain]: Secondary | ICD-10-CM | POA: Diagnosis not present

## 2022-02-19 ENCOUNTER — Encounter: Payer: Self-pay | Admitting: Family

## 2022-02-19 ENCOUNTER — Ambulatory Visit (INDEPENDENT_AMBULATORY_CARE_PROVIDER_SITE_OTHER): Payer: Medicare Other | Admitting: Family

## 2022-02-19 VITALS — BP 140/62 | HR 59 | Temp 98.1°F | Resp 16 | Ht 66.0 in | Wt 184.0 lb

## 2022-02-19 DIAGNOSIS — R7303 Prediabetes: Secondary | ICD-10-CM | POA: Diagnosis not present

## 2022-02-19 DIAGNOSIS — I10 Essential (primary) hypertension: Secondary | ICD-10-CM | POA: Diagnosis not present

## 2022-02-19 DIAGNOSIS — E785 Hyperlipidemia, unspecified: Secondary | ICD-10-CM | POA: Diagnosis not present

## 2022-02-19 DIAGNOSIS — Z0000 Encounter for general adult medical examination without abnormal findings: Secondary | ICD-10-CM | POA: Insufficient documentation

## 2022-02-19 HISTORY — DX: Encounter for general adult medical examination without abnormal findings: Z00.00

## 2022-02-19 LAB — BASIC METABOLIC PANEL
BUN: 22 mg/dL (ref 6–23)
CO2: 30 mEq/L (ref 19–32)
Calcium: 9.8 mg/dL (ref 8.4–10.5)
Chloride: 101 mEq/L (ref 96–112)
Creatinine, Ser: 0.95 mg/dL (ref 0.40–1.20)
GFR: 58.16 mL/min — ABNORMAL LOW (ref >60.00)
Glucose, Bld: 92 mg/dL (ref 70–99)
Potassium: 4.2 mEq/L (ref 3.5–5.1)
Sodium: 139 mEq/L (ref 135–145)

## 2022-02-19 LAB — HEMOGLOBIN A1C: Hgb A1c MFr Bld: 6.2 % (ref 4.6–6.5)

## 2022-02-19 MED ORDER — AMLODIPINE BESYLATE 2.5 MG PO TABS: 2.5000 mg | ORAL_TABLET | Freq: Every day | ORAL | 1 refills | Status: DC

## 2022-02-19 NOTE — Assessment & Plan Note (Signed)
Lab Results  Component Value Date   CHOL 170 08/14/2021   HDL 69.50 08/14/2021   LDLCALC 78 08/14/2021   LDLDIRECT 136.2 07/21/2012   TRIG 114.0 08/14/2021   CHOLHDL 2 08/14/2021   Stable on lipitor '10mg'$ . Continue same.

## 2022-02-19 NOTE — Assessment & Plan Note (Signed)
Lab Results  Component Value Date   HGBA1C 6.3 08/14/2021   HGBA1C 6.1 02/13/2021   HGBA1C 6.1 08/08/2020   Lab Results  Component Value Date   MICROALBUR <0.7 08/08/2020   LDLCALC 78 08/14/2021   CREATININE 0.90 08/14/2021   Continue healthy diet and exercise.

## 2022-02-19 NOTE — Progress Notes (Signed)
Subjective:   By signing my name below, I, Carylon Perches, attest that this documentation has been prepared under the direction and in the presence of [place], NP 02/19/2022   Patient ID: Patricia Lee, female    DOB: February 17, 1946, 76 y.o.   MRN: [id number]  Chief Complaint  Patient presents with   Annual Exam    HPI Patient is in today for a comprehensive physical exam  Blood Sugar: Her blood sugar levels are borderline Lab Results  Component Value Date   HGBA1C 6.3 08/14/2021   Blood Pressure: She is currently taking 2.5 mg of Amlodipine twice a day. She was previously taking one tablet of the medication but noticed that her blood pressure was elevating so therefore, she started taking one tablet in the morning and another in the evening. She reports a systolic blood pressure range of 122 - 270 and diastolic range of 68 - 75. She is also taking 25 mg of Chlorthalidone. As of today's visit, her blood pressure is 140/62 BP Readings from Last 3 Encounters:  02/19/22 (!) 140/62  08/14/21 (!) 148/74  05/21/21 (!) 166/84   Pulse Readings from Last 3 Encounters:  02/19/22 (!) 59  08/14/21 81  05/21/21 63   Cholesterol: Her cholesterol levels are normal. She is currently taking 10 mg of Lipitor.  Lab Results  Component Value Date   CHOL 170 08/14/2021   HDL 69.50 08/14/2021   LDLCALC 78 08/14/2021   LDLDIRECT 136.2 07/21/2012   TRIG 114.0 08/14/2021   CHOLHDL 2 08/14/2021   She denies having any fever, new muscle pain, joint pain , new moles, rashes, congestion, sinus pain, sore throat, palpations, cough, SOB ,wheezing,n/v/d constipation, blood in stool, dysuria, frequency, hematuria, depression, anxiety, headaches at this time  Social history: She reports no recent surgeries. She states that her paternal grandfather was diagnosed with cancer and her maternal grandmother had heart failure.   Colonoscopy: Last completed on 05/21/2021 Dexa: She reports that she  received a dexa scan years ago and is not interested in receiving a dexa scan for the future. She consumes milk, ice cream and yogurt.  Mammogram: Last completed on 12/06/2021 Immunizations: She is UTD on pneumonia and tetanus vaccines. She is not interested in receiving an influenza vaccine during today's visit.  Diet: She is maintaining a healthy diet.  Exercise: She swims about three times a week. She also does an aerobics class.  Dental: She is UTD on dental exams  Vision: She is UTD on vision exams.    Health Maintenance Due  Topic Date Due   Zoster Vaccines- Shingrix (1 of 2) Never done   COVID-19 Vaccine (7 - Pfizer series) 07/22/2021    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   Procedure: RIGHT TOTAL HIP ARTHROPLASTY ANTERIOR APPROACH;  Surgeon: Paralee Cancel, MD;  Location: [facility];  Service: Orthopedics;  Laterality: Right;    Family History  Problem Relation Age of Onset   Stomach cancer Mother        died at 81   Arthritis Mother    Cancer Mother    Colon cancer Father  died at 70   Cancer Father    Heart failure Maternal Grandmother    Cancer Paternal Grandmother    Colon polyps Neg Hx    Rectal cancer Neg Hx    Esophageal cancer Neg Hx    Pancreatic cancer Neg Hx    Liver disease Neg Hx     Social History   Socioeconomic History   Marital status: Widowed    Spouse name: Not on file   Number of children: Not on file   Years of education: Not on file   Highest education level: Not on file  Occupational History   Occupation: retired  Tobacco Use   Smoking status: Former    Packs/day: 0.25    Years: 20.00    Total pack years: 5.00    Types: Cigarettes     Quit date: 06/10/1993    Years since quitting: 28.7   Smokeless tobacco: Never  Substance and Sexual Activity   Alcohol use: Yes    Alcohol/week: 5.0 standard drinks of alcohol    Types: 5 Glasses of wine per week    Comment: wine, 1-2 glasses every other day   Drug use: No   Sexual activity: Not Currently    Birth control/protection: Post-menopausal  Other Topics Concern   Not on file  Social History Narrative   2 sons, one in [place] and one in [place].    Retired from [employer]   Enjoys going to the Y 3 times a week   Active in her church   Husband died in 09-19-2012   Social Determinants of Health   Financial Resource Strain: Low Risk  (09/27/2021)   Overall Financial Resource Strain (CARDIA)    Difficulty of Paying Living Expenses: Not hard at all  Food Insecurity: No Food Insecurity (09/27/2021)   Hunger Vital Sign    Worried About Running Out of Food in the Last Year: Never true    Ran Out of Food in the Last Year: Never true  Transportation Needs: No Transportation Needs (09/27/2021)   PRAPARE - Hydrologist (Medical): No    Lack of Transportation (Non-Medical): No  Physical Activity: Sufficiently Active (09/27/2021)   Exercise Vital Sign    Days of Exercise per Week: 3 days    Minutes of Exercise per Session: 90 min  Stress: No Stress Concern Present (09/27/2021)   [place]    Feeling of Stress : Not at all  Social Connections: Moderately Integrated (09/27/2021)   Social Connection and Isolation Panel [NHANES]    Frequency of Communication with Friends and Family: More than three times a week    Frequency of Social Gatherings with Friends and Family: More than three times a week    Attends Religious Services: More than 4 times per year    Active Member of [employer] or Organizations: Yes    Attends Archivist Meetings: More than 4 times per year    Marital  Status: Widowed  Intimate Partner Violence: Not At Risk (09/27/2021)   Humiliation, Afraid, Rape, and Kick questionnaire    Fear of Current or Ex-Partner: No    Emotionally Abused: No    Physically Abused: No    Sexually Abused: No    Outpatient Medications Prior to Visit  Medication Sig Dispense Refill   atorvastatin (LIPITOR) 10 MG tablet TAKE 1 TABLET BY MOUTH DAILY 90 tablet 1   betamethasone dipropionate 0.05 % cream Apply  topically 2 (two) times daily. 30 g 0   chlorthalidone (HYGROTON) 25 MG tablet TAKE 1 TABLET BY MOUTH DAILY 90 tablet 1   Cholecalciferol (VITAMIN D) 50 MCG (2000 UT) tablet      gabapentin (NEURONTIN) 300 MG capsule Take 300 mg by mouth 3 (three) times daily.     Multiple Vitamins-Minerals (MULTIVITAMIN WITH MINERALS) tablet Take 1 tablet by mouth daily.     Omega-3 Fatty Acids ([employer]S FISH OIL) 1200 MG CAPS Take 1,200 mg by mouth daily.     polycarbophil (FIBERCON) 625 MG tablet Take 625 mg by mouth daily.     Propylene Glycol-Glycerin 0.6-0.6 % SOLN Apply 2 drops to eye daily as needed (dry eyes).     Turmeric 500 MG CAPS Take 500 mg by mouth daily.     amLODipine (NORVASC) 2.5 MG tablet TAKE 1 TABLET BY MOUTH DAILY 90 tablet 1   No facility-administered medications prior to visit.    Allergies  Allergen Reactions   Codeine Other (See Comments)    Tachycardia and insomnia   Erythromycin Other (See Comments)    Tachycardia and insomnia   Influenza Vaccines Other (See Comments)    Nerve reaction similar to shingles- all nerves in back on fire    Prednisone Other (See Comments)    Tachycardia and insomnia   Tetracyclines & Related Other (See Comments)    Tachycardia/insomnia    Review of Systems  Constitutional:  Negative for fever.  HENT:  Negative for congestion, sinus pain and sore throat.   Respiratory:  Negative for cough, shortness of breath and wheezing.   Cardiovascular:  Negative for palpitations.  Gastrointestinal:  Negative for blood in  stool, constipation, diarrhea, nausea and vomiting.  Genitourinary:  Negative for dysuria, frequency and hematuria.  Musculoskeletal:  Negative for joint pain and myalgias.  Skin:  Negative for rash.       (-) [place]  Neurological:  Negative for headaches.  Psychiatric/Behavioral:  Negative for depression. The patient is not nervous/anxious.        Objective:    Physical Exam Constitutional:      General: She is not in acute distress.    Appearance: Normal appearance. She is not ill-appearing.  HENT:     Head: Normocephalic and atraumatic.     Right Ear: Tympanic membrane, ear canal and external ear normal.     Left Ear: Tympanic membrane, ear canal and external ear normal.     Mouth/Throat:     Pharynx: No oropharyngeal exudate.  Eyes:     Extraocular Movements: Extraocular movements intact.     Pupils: Pupils are equal, round, and reactive to light.  Neck:     Thyroid: No thyromegaly.  Cardiovascular:     Rate and Rhythm: Normal rate and regular rhythm.     Heart sounds: Normal heart sounds. No murmur heard.    No gallop.  Pulmonary:     Effort: Pulmonary effort is normal. No respiratory distress.     Breath sounds: Normal breath sounds. No wheezing or rales.  Abdominal:     General: Bowel sounds are normal. There is no distension.     Palpations: Abdomen is soft.     Tenderness: There is no abdominal tenderness. There is no guarding.  Musculoskeletal:     Comments: 5/5 strength in both upper and lower extremities    Lymphadenopathy:     Cervical: No cervical adenopathy.  Skin:    General: Skin is warm and dry.  Neurological:  Mental Status: She is alert and oriented to person, place, and time.     Deep Tendon Reflexes:     Reflex Scores:      Patellar reflexes are 2+ on the right side and 2+ on the left side. Psychiatric:        Mood and Affect: Mood normal.        Behavior: Behavior normal.        Judgment: Judgment normal.     BP (!) 140/62   Pulse  (!) 59   Temp 98.1 F (36.7 C) (Oral)   Resp 16   Ht '5\' 6"'$  (1.676 m)   Wt 184 lb (83.5 kg)   SpO2 99%   BMI 29.70 kg/m  Wt Readings from Last 3 Encounters:  02/19/22 184 lb (83.5 kg)  09/27/21 179 lb (81.2 kg)  08/14/21 179 lb (81.2 kg)       Assessment & Plan:   Problem List Items Addressed This Visit       Unprioritized   Preventative health care - Primary    Continue work on healthy diet, exercise.  Declines flu shot, declines bone density. Recommended that she get the new Covid booster once it becomes available. Mammo up to date. Colo was last year and she does not need a follow up colonoscopy.       Hyperlipidemia    Lab Results  Component Value Date   CHOL 170 08/14/2021   HDL 69.50 08/14/2021   LDLCALC 78 08/14/2021   LDLDIRECT 136.2 07/21/2012   TRIG 114.0 08/14/2021   CHOLHDL 2 08/14/2021  Stable on lipitor '10mg'$ . Continue same.       Relevant Medications   amLODipine (NORVASC) 2.5 MG tablet   Essential hypertension    BP Readings from Last 3 Encounters:  02/19/22 (!) 140/62  08/14/21 (!) 148/74  05/21/21 (!) 166/84  Reports home readings were high in the AM so she added 2.'5mg'$  amlodipine in the PM and readings have improved. Will continue amlodipine at this dose along with chlorthalidone in the AM.        Relevant Medications   amLODipine (NORVASC) 2.5 MG tablet   Borderline diabetes    Lab Results  Component Value Date   HGBA1C 6.3 08/14/2021   HGBA1C 6.1 02/13/2021   HGBA1C 6.1 08/08/2020   Lab Results  Component Value Date   MICROALBUR <0.7 08/08/2020   LDLCALC 78 08/14/2021   CREATININE 0.90 08/14/2021  Continue healthy diet and exercise.       Relevant Orders   Hemoglobin X3K   Basic metabolic panel   Meds ordered this encounter  Medications   amLODipine (NORVASC) 2.5 MG tablet    Sig: Take 1 tablet (2.5 mg total) by mouth daily.    Dispense:  180 tablet    Refill:  1    Requesting 1 year supply    Order Specific Question:    Supervising Provider    Answer:   Penni Homans A [[id number]]    I, Nance Pear, NP, personally preformed the services described in this documentation.  All medical record entries made by the scribe were at my direction and in my presence.  I have reviewed the chart and discharge instructions (if applicable) and agree that the record reflects my personal performance and is accurate and complete. 02/19/2022   I,Amber Collins,acting as a Education administrator for Nance Pear, NP.,have documented all relevant documentation on the behalf of Nance Pear, NP,as directed by  Nance Pear,  NP while in the presence of Nance Pear, NP.    Nance Pear, NP

## 2022-02-19 NOTE — Assessment & Plan Note (Addendum)
BP Readings from Last 3 Encounters:  02/19/22 (!) 140/62  08/14/21 (!) 148/74  05/21/21 (!) 166/84   Reports home readings were high in the AM so she added 2.'5mg'$  amlodipine in the PM and readings have improved. Will continue amlodipine at this dose along with chlorthalidone in the AM.

## 2022-02-19 NOTE — Assessment & Plan Note (Signed)
Continue work on [employer], exercise.  Declines flu shot, declines bone density. Recommended that she get the new Covid booster once it becomes available. Mammo up to date. Colo was last year and she does not need a follow up colonoscopy.

## 2022-03-22 ENCOUNTER — Other Ambulatory Visit (HOSPITAL_BASED_OUTPATIENT_CLINIC_OR_DEPARTMENT_OTHER): Payer: Self-pay

## 2022-03-22 MED ORDER — COMIRNATY 30 MCG/0.3ML IM SUSP
INTRAMUSCULAR | 0 refills | Status: DC
  Filled 2022-03-22: qty 0.3, 1d supply, fill #0

## 2022-03-29 ENCOUNTER — Other Ambulatory Visit: Payer: Self-pay | Admitting: Family

## 2022-03-29 MED ORDER — CHLORTHALIDONE 25 MG PO TABS: 25.0000 mg | ORAL_TABLET | Freq: Every day | ORAL | 2 refills | Status: DC

## 2022-03-29 NOTE — Addendum Note (Signed)
Addended by: Debbrah Alar on: 03/29/2022 09:25 AM   Modules accepted: Orders

## 2022-04-02 ENCOUNTER — Other Ambulatory Visit: Payer: Self-pay | Admitting: Family

## 2022-04-03 ENCOUNTER — Other Ambulatory Visit: Payer: Self-pay

## 2022-04-03 ENCOUNTER — Telehealth: Payer: Self-pay | Admitting: Family

## 2022-04-03 MED ORDER — CHLORTHALIDONE 25 MG PO TABS: 25.0000 mg | ORAL_TABLET | Freq: Every day | ORAL | 2 refills | Status: DC

## 2022-04-03 NOTE — Telephone Encounter (Signed)
Pt states pharmacy did not receive chlorthalidone and would like it resent.   [place], [place] [address] [place] [address] Phone: [phone]  Fax: [phone]

## 2022-04-03 NOTE — Telephone Encounter (Signed)
Rx resent to pharmacy listed on the message

## 2022-04-09 ENCOUNTER — Other Ambulatory Visit: Payer: Self-pay

## 2022-04-09 ENCOUNTER — Encounter: Payer: Self-pay | Admitting: Family

## 2022-04-09 MED ORDER — AMLODIPINE BESYLATE 2.5 MG PO TABS: 2.5000 mg | ORAL_TABLET | Freq: Every day | ORAL | 1 refills | Status: DC

## 2022-04-14 ENCOUNTER — Encounter: Payer: Self-pay | Admitting: Family

## 2022-04-15 ENCOUNTER — Other Ambulatory Visit: Payer: Self-pay

## 2022-04-15 MED ORDER — AMLODIPINE BESYLATE 2.5 MG PO TABS: 2.5000 mg | ORAL_TABLET | Freq: Two times a day (BID) | ORAL | 1 refills | Status: DC

## 2022-08-06 DIAGNOSIS — M1712 Unilateral primary osteoarthritis, left knee: Secondary | ICD-10-CM | POA: Diagnosis not present

## 2022-08-20 ENCOUNTER — Ambulatory Visit (INDEPENDENT_AMBULATORY_CARE_PROVIDER_SITE_OTHER): Payer: Medicare Other | Admitting: Family

## 2022-08-20 ENCOUNTER — Encounter: Payer: Self-pay | Admitting: Family

## 2022-08-20 VITALS — BP 132/61 | HR 72 | Temp 97.4°F | Resp 16 | Wt 181.0 lb

## 2022-08-20 DIAGNOSIS — R7303 Prediabetes: Secondary | ICD-10-CM | POA: Diagnosis not present

## 2022-08-20 DIAGNOSIS — E785 Hyperlipidemia, unspecified: Secondary | ICD-10-CM

## 2022-08-20 DIAGNOSIS — I10 Essential (primary) hypertension: Secondary | ICD-10-CM

## 2022-08-20 LAB — LIPID PANEL
Cholesterol: 184 mg/dL (ref 0–200)
HDL: 87.1 mg/dL (ref >39.00)
LDL Cholesterol: 79 mg/dL (ref 0–99)
NonHDL: 96.74
Total CHOL/HDL Ratio: 2
Triglycerides: 89 mg/dL (ref 0.0–149.0)
VLDL: 17.8 mg/dL (ref 0.0–40.0)

## 2022-08-20 LAB — COMPREHENSIVE METABOLIC PANEL
ALT: 11 U/L (ref 0–35)
AST: 15 U/L (ref 0–37)
Albumin: 4.1 g/dL (ref 3.5–5.2)
Alkaline Phosphatase: 84 U/L (ref 39–117)
BUN: 25 mg/dL — ABNORMAL HIGH (ref 6–23)
CO2: 30 mEq/L (ref 19–32)
Calcium: 10.1 mg/dL (ref 8.4–10.5)
Chloride: 98 mEq/L (ref 96–112)
Creatinine, Ser: 1 mg/dL (ref 0.40–1.20)
GFR: 54.49 mL/min — ABNORMAL LOW (ref >60.00)
Glucose, Bld: 94 mg/dL (ref 70–99)
Potassium: 4.3 mEq/L (ref 3.5–5.1)
Sodium: 137 mEq/L (ref 135–145)
Total Bilirubin: 1 mg/dL (ref 0.2–1.2)
Total Protein: 7.1 g/dL (ref 6.0–8.3)

## 2022-08-20 LAB — HEMOGLOBIN A1C: Hgb A1c MFr Bld: 6.2 % (ref 4.6–6.5)

## 2022-08-20 MED ORDER — CHLORTHALIDONE 25 MG PO TABS: 25.0000 mg | ORAL_TABLET | Freq: Every day | ORAL | 1 refills | Status: DC

## 2022-08-20 MED ORDER — AMLODIPINE BESYLATE 2.5 MG PO TABS: 2.5000 mg | ORAL_TABLET | Freq: Two times a day (BID) | ORAL | 1 refills | Status: DC

## 2022-08-20 MED ORDER — ATORVASTATIN CALCIUM 10 MG PO TABS: 10.0000 mg | ORAL_TABLET | Freq: Every day | ORAL | 2 refills | Status: DC

## 2022-08-20 NOTE — Assessment & Plan Note (Signed)
BP Readings from Last 3 Encounters:  08/20/22 132/61  02/19/22 (!) 140/62  08/14/21 (!) 148/74   Stable on amlodipine 2.5 mg once daily.

## 2022-08-20 NOTE — Assessment & Plan Note (Signed)
Lab Results  Component Value Date   HGBA1C 6.2 02/19/2022   HGBA1C 6.3 08/14/2021   HGBA1C 6.1 02/13/2021   Lab Results  Component Value Date   MICROALBUR <0.7 08/08/2020   LDLCALC 78 08/14/2021   CREATININE 0.95 02/19/2022

## 2022-08-20 NOTE — Assessment & Plan Note (Signed)
Lab Results  Component Value Date   CHOL 170 08/14/2021   HDL 69.50 08/14/2021   LDLCALC 78 08/14/2021   LDLDIRECT 136.2 07/21/2012   TRIG 114.0 08/14/2021   CHOLHDL 2 08/14/2021   Continues lipitor. Due for follow up.

## 2022-08-20 NOTE — Progress Notes (Signed)
Subjective:   By signing my name below, I, Patricia Lee, attest that this documentation has been prepared under the direction and in the presence of Debbrah Alar, NP. 08/20/2022.   Patient ID: Patricia Lee, female    DOB: [birth year], 77 y.o.   MRN: [id number]  Chief Complaint  Patient presents with   Hypertension    Here for follow up    HPI Patient is in today for an office visit.  Refills:  She is requesting a refill of betamethasone. In addition to her back, she has also been using this on her left side for mild pruritus.  Blood pressure: Her blood pressure is more controlled in clinic today. Home blood pressures are reportedly well controlled as well. She is taking 2.5 mg amlodipine BID.  BP Readings from Last 3 Encounters:  08/20/22 132/61  02/19/22 (!) 140/62  08/14/21 (!) 148/74   Left knee pain:  Recently she received a steroidal injection in her left knee, with significant improvement in her knee pain. She is hopeful that the treatment will last as long as her previous shot.   Borderline diabetes:  We reviewed her last A1C. She continues to exercise regularly, and she tries to be consistent with her diet when able. Lab Results  Component Value Date   HGBA1C 6.2 02/19/2022   Immunizations:  She has not yet received her second Shingrix vaccination. She reports that she had her last Covid booster 03/22/2022.  Intermittent back pain: She only takes gabapentin as needed when her back goes out. She states that when this occurs, the pain is related to her nerves (not muscle) and it is difficult to ambulate.  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   Procedure: RIGHT TOTAL HIP  ARTHROPLASTY ANTERIOR APPROACH;  Surgeon: Paralee Cancel, MD;  Location: [facility];  Service: Orthopedics;  Laterality: Right;    Family History  Problem Relation Age of Onset   Stomach cancer Mother        died at 52   Arthritis Mother    Cancer Mother    Colon cancer Father        died at 72   Cancer Father    Heart failure Maternal Grandmother    Cancer Paternal Grandmother    Colon polyps Neg Hx    Rectal cancer Neg Hx    Esophageal cancer Neg Hx    Pancreatic cancer Neg Hx    Liver disease Neg Hx     Social History   Socioeconomic History   Marital status: Widowed    Spouse name: Not on file   Number of children: Not on file   Years of education: Not on file   Highest education level: Not on file  Occupational History   Occupation: retired  Tobacco Use   Smoking status: Former    Packs/day: 0.25    Years: 20.00    Total pack years: 5.00    Types: Cigarettes    Quit date: 06/10/1993    Years since quitting: 29.2   Smokeless tobacco: Never  Substance and Sexual Activity   Alcohol use: Yes    Alcohol/week:  5.0 standard drinks of alcohol    Types: 5 Glasses of wine per week    Comment: wine, 1-2 glasses every other day   Drug use: No   Sexual activity: Not Currently    Birth control/protection: Post-menopausal  Other Topics Concern   Not on file  Social History Narrative   2 sons, one in [place] and one in [place].    Retired from [employer]   Enjoys [facility] to the Y 3 times a week   Active in her church   Husband died in 09/14/2012   Social Determinants of Health   Financial Resource Strain: Low Risk  (09/27/2021)   Overall Financial Resource Strain (CARDIA)    Difficulty of Paying Living Expenses: Not hard at all  Food Insecurity: No Food Insecurity (09/27/2021)   Hunger Vital Sign    Worried About Running Out of Food in the Last Year: Never true    Ran Out of Food in the Last Year: Never true  Transportation Needs: No Transportation Needs  (09/27/2021)   PRAPARE - Hydrologist (Medical): No    Lack of Transportation (Non-Medical): No  Physical Activity: Sufficiently Active (09/27/2021)   Exercise Vital Sign    Days of Exercise per Week: 3 days    Minutes of Exercise per Session: 90 min  Stress: No Stress Concern Present (09/27/2021)   [place]    Feeling of Stress : Not at all  Social Connections: Moderately Integrated (09/27/2021)   Social Connection and Isolation Panel [NHANES]    Frequency of Communication with Friends and Family: More than three times a week    Frequency of Social Gatherings with Friends and Family: More than three times a week    Attends Religious Services: More than 4 times per year    Active Member of [employer] or Organizations: Yes    Attends Archivist Meetings: More than 4 times per year    Marital Status: Widowed  Intimate Partner Violence: Not At Risk (09/27/2021)   Humiliation, Afraid, Rape, and Kick questionnaire    Fear of Current or Ex-Partner: No    Emotionally Abused: No    Physically Abused: No    Sexually Abused: No    Outpatient Medications Prior to Visit  Medication Sig Dispense Refill   betamethasone dipropionate 0.05 % cream Apply topically 2 (two) times daily. 30 g 0   Cholecalciferol (VITAMIN D) 50 MCG (2000 UT) tablet      gabapentin (NEURONTIN) 300 MG capsule Take 300 mg by mouth 3 (three) times daily.     Multiple Vitamins-Minerals (MULTIVITAMIN WITH MINERALS) tablet Take 1 tablet by mouth daily.     Omega-3 Fatty Acids ([employer] FISH OIL) 1200 MG CAPS Take 1,200 mg by mouth daily.     polycarbophil (FIBERCON) 625 MG tablet Take 625 mg by mouth daily.     Propylene Glycol-Glycerin 0.6-0.6 % SOLN Apply 2 drops to eye daily as needed (dry eyes).     Turmeric 500 MG CAPS Take 500 mg by mouth daily.     amLODipine (NORVASC) 2.5 MG tablet Take 1 tablet (2.5 mg total) by mouth in the  morning and at bedtime. 180 tablet 1   atorvastatin (LIPITOR) 10 MG tablet TAKE 1 TABLET BY MOUTH DAILY 100 tablet 2   chlorthalidone (HYGROTON) 25 MG tablet Take 1 tablet (25 mg total) by mouth daily. 100 tablet 2   COVID-19 mRNA Vac-TriS, Pfizer, (  COMIRNATY) SUSP injection Inject into the muscle. 0.3 mL 0   No facility-administered medications prior to visit.    Allergies  Allergen Reactions   Codeine Other (See Comments)    Tachycardia and insomnia   Erythromycin Other (See Comments)    Tachycardia and insomnia   Influenza Vaccines Other (See Comments)    Nerve reaction similar to shingles- all nerves in back on fire    Prednisone Other (See Comments)    Tachycardia and insomnia   Tetracyclines & Related Other (See Comments)    Tachycardia/insomnia    ROS See HPI.     Objective:    Physical Exam Constitutional:      Appearance: Normal appearance.  HENT:     Head: Normocephalic and atraumatic.     Right Ear: Tympanic membrane, ear canal and external ear normal.     Left Ear: Tympanic membrane, ear canal and external ear normal.  Eyes:     Extraocular Movements: Extraocular movements intact.     Pupils: Pupils are equal, round, and reactive to light.  Cardiovascular:     Rate and Rhythm: Normal rate and regular rhythm.     Heart sounds: Normal heart sounds. No murmur heard.    No gallop.  Pulmonary:     Effort: Pulmonary effort is normal. No respiratory distress.     Breath sounds: Normal breath sounds. No wheezing or rales.  Skin:    General: Skin is warm and dry.  Neurological:     General: No focal deficit present.     Mental Status: She is alert and oriented to person, place, and time.  Psychiatric:        Mood and Affect: Mood normal.        Behavior: Behavior normal.     BP 132/61 (BP Location: Right Arm, Patient Position: Sitting, Cuff Size: Small)   Pulse 72   Temp (!) 97.4 F (36.3 C) (Oral)   Resp 16   Wt 181 lb (82.1 kg)   BMI 29.21 kg/m  Wt  Readings from Last 3 Encounters:  08/20/22 181 lb (82.1 kg)  02/19/22 184 lb (83.5 kg)  09/27/21 179 lb (81.2 kg)    Diabetic Foot Exam - Simple   No data filed    Lab Results  Component Value Date   WBC 10.0 04/10/2016   HGB 10.2 (L) 04/10/2016   HCT 31.2 (L) 04/10/2016   PLT 231 04/10/2016   GLUCOSE 92 02/19/2022   CHOL 170 08/14/2021   TRIG 114.0 08/14/2021   HDL 69.50 08/14/2021   LDLDIRECT 136.2 07/21/2012   LDLCALC 78 08/14/2021   ALT 11 08/14/2021   AST 17 08/14/2021   NA 139 02/19/2022   K 4.2 02/19/2022   CL 101 02/19/2022   CREATININE 0.95 02/19/2022   BUN 22 02/19/2022   CO2 30 02/19/2022   TSH 1.59 10/12/2015   HGBA1C 6.2 02/19/2022   MICROALBUR <0.7 08/08/2020    Lab Results  Component Value Date   TSH 1.59 10/12/2015   Lab Results  Component Value Date   WBC 10.0 04/10/2016   HGB 10.2 (L) 04/10/2016   HCT 31.2 (L) 04/10/2016   MCV 90.4 04/10/2016   PLT 231 04/10/2016   Lab Results  Component Value Date   NA 139 02/19/2022   K 4.2 02/19/2022   CO2 30 02/19/2022   GLUCOSE 92 02/19/2022   BUN 22 02/19/2022   CREATININE 0.95 02/19/2022   BILITOT 0.9 08/14/2021   ALKPHOS 85 08/14/2021  AST 17 08/14/2021   ALT 11 08/14/2021   PROT 7.1 08/14/2021   ALBUMIN 4.2 08/14/2021   CALCIUM 9.8 02/19/2022   ANIONGAP 4 (L) 04/10/2016   GFR 58.16 (L) 02/19/2022   Lab Results  Component Value Date   CHOL 170 08/14/2021   Lab Results  Component Value Date   HDL 69.50 08/14/2021   Lab Results  Component Value Date   LDLCALC 78 08/14/2021   Lab Results  Component Value Date   TRIG 114.0 08/14/2021   Lab Results  Component Value Date   CHOLHDL 2 08/14/2021   Lab Results  Component Value Date   HGBA1C 6.2 02/19/2022       Assessment & Plan:   Problem List Items Addressed This Visit       Unprioritized   Hyperlipidemia    Lab Results  Component Value Date   CHOL 170 08/14/2021   HDL 69.50 08/14/2021   LDLCALC 78 08/14/2021    LDLDIRECT 136.2 07/21/2012   TRIG 114.0 08/14/2021   CHOLHDL 2 08/14/2021  Continues lipitor. Due for follow up.       Relevant Medications   chlorthalidone (HYGROTON) 25 MG tablet   atorvastatin (LIPITOR) 10 MG tablet   amLODipine (NORVASC) 2.5 MG tablet   Other Relevant Orders   Lipid panel   Essential hypertension - Primary    BP Readings from Last 3 Encounters:  08/20/22 132/61  02/19/22 (!) 140/62  08/14/21 (!) 148/74  Stable on amlodipine 2.5 mg once daily.       Relevant Medications   chlorthalidone (HYGROTON) 25 MG tablet   atorvastatin (LIPITOR) 10 MG tablet   amLODipine (NORVASC) 2.5 MG tablet   Other Relevant Orders   Comp Met (CMET)   Borderline diabetes    Lab Results  Component Value Date   HGBA1C 6.2 02/19/2022   HGBA1C 6.3 08/14/2021   HGBA1C 6.1 02/13/2021   Lab Results  Component Value Date   MICROALBUR <0.7 08/08/2020   LDLCALC 78 08/14/2021   CREATININE 0.95 02/19/2022        Relevant Orders   HgB A1c   Comp Met (CMET)     Meds ordered this encounter  Medications   chlorthalidone (HYGROTON) 25 MG tablet    Sig: Take 1 tablet (25 mg total) by mouth daily.    Dispense:  100 tablet    Refill:  1    Please send a replace/new response with 100-Day Supply if appropriate to maximize member benefit. Requesting 1 year supply.    Order Specific Question:   Supervising Provider    Answer:   Penni Homans A [[id number]]   atorvastatin (LIPITOR) 10 MG tablet    Sig: Take 1 tablet (10 mg total) by mouth daily.    Dispense:  100 tablet    Refill:  2    Please send a replace/new response with 100-Day Supply if appropriate to maximize member benefit. Requesting 1 year supply.    Order Specific Question:   Supervising Provider    Answer:   Penni Homans A [[id number]]   amLODipine (NORVASC) 2.5 MG tablet    Sig: Take 1 tablet (2.5 mg total) by mouth in the morning and at bedtime.    Dispense:  180 tablet    Refill:  1    Requesting 1 year supply    Order  Specific Question:   Supervising Provider    Answer:   Penni Homans A [[id number]]    I, Patricia Pear, NP, personally  preformed the services described in this documentation.  All medical record entries made by the scribe were at my direction and in my presence.  I have reviewed the chart and discharge instructions (if applicable) and agree that the record reflects my personal performance and is accurate and complete. 08/20/2022.  I,Mathew Stumpf,acting as a Education administrator for [employer], NP.,have documented all relevant documentation on the behalf of Patricia Pear, NP,as directed by  Patricia Pear, NP while in the presence of Patricia Pear, NP.   Patricia Pear, NP

## 2022-09-24 ENCOUNTER — Telehealth: Payer: Self-pay | Admitting: Family

## 2022-09-24 NOTE — Telephone Encounter (Signed)
Copied from CRM [id number]. Topic: Medicare AWV >> Sep 24, 2022 11:11 AM Payton Doughty wrote: Reason for CRM: Called patient to schedule Medicare Annual Wellness Visit (AWV). Left message for patient to call back and schedule Medicare Annual Wellness Visit (AWV). Amber does only phone and office visits for her AWV  Last date of AWV: none  Please schedule an appointment at any time with Donne Anon, CMA  .  If any questions, please contact me.  Thank you ,  Verlee Rossetti; Care Guide Ambulatory Clinical Support [place] [facility] Direct Dial: [phone]

## 2022-09-25 NOTE — Telephone Encounter (Signed)
ERROR

## 2022-10-07 ENCOUNTER — Ambulatory Visit (INDEPENDENT_AMBULATORY_CARE_PROVIDER_SITE_OTHER): Payer: Medicare Other | Admitting: *Deleted

## 2022-10-07 VITALS — BP 139/65 | Ht 66.0 in | Wt 180.0 lb

## 2022-10-07 DIAGNOSIS — Z0000 Encounter for general adult medical examination without abnormal findings: Secondary | ICD-10-CM | POA: Diagnosis not present

## 2022-10-07 NOTE — Patient Instructions (Signed)
Ms. Patricia Lee , Thank you for taking time to come for your Medicare Wellness Visit. I appreciate your ongoing commitment to your health goals. Please review the following plan we discussed and let me know if I can assist you in the future.     This is a list of the screening recommended for you and due dates:  Health Maintenance  Topic Date Due   Hepatitis C Screening: USPSTF Recommendation to screen - Ages 47-77 yo.  Never done   COVID-19 Vaccine (8 - 2023-24 season) 05/17/2022   Zoster (Shingles) Vaccine (1 of 2) 11/20/2022*   Medicare Annual Wellness Visit  10/07/2023   DTaP/Tdap/Td vaccine (3 - Td or Tdap) 02/27/2026   Pneumonia Vaccine  Completed   HPV Vaccine  Aged Out   Flu Shot  Discontinued   DEXA scan (bone density measurement)  Discontinued   Colon Cancer Screening  Discontinued  *Topic was postponed. The date shown is not the original due date.    Next appointment: Follow up in one year for your annual wellness visit.   Preventive Care 77 Years and Older, Female Preventive care refers to lifestyle choices and visits with your health care provider that can promote health and wellness. What does preventive care include? A yearly physical exam. This is also called an annual well check. Dental exams once or twice a year. Routine eye exams. Ask your health care provider how often you should have your eyes checked. Personal lifestyle choices, including: Daily care of your teeth and gums. Regular physical activity. Eating a healthy diet. Avoiding tobacco and drug use. Limiting alcohol use. Practicing safe sex. Taking low-dose aspirin every day. Taking vitamin and mineral supplements as recommended by your health care provider. What happens during an annual well check? The services and screenings done by your health care provider during your annual well check will depend on your age, overall health, lifestyle risk factors, and family history of disease. Counseling  Your  health care provider may ask you questions about your: Alcohol use. Tobacco use. Drug use. Emotional well-being. Home and relationship well-being. Sexual activity. Eating habits. History of falls. Memory and ability to understand (cognition). Work and work Astronomer. Reproductive health. Screening  You may have the following tests or measurements: Height, weight, and BMI. Blood pressure. Lipid and cholesterol levels. These may be checked every 5 years, or more frequently if you are over 63 years old. Skin check. Lung cancer screening. You may have this screening every year starting at age 35 if you have a 30-pack-year history of smoking and currently smoke or have quit within the past 15 years. Fecal occult blood test (FOBT) of the stool. You may have this test every year starting at age 64. Flexible sigmoidoscopy or colonoscopy. You may have a sigmoidoscopy every 5 years or a colonoscopy every 10 years starting at age 74. Hepatitis C blood test. Hepatitis B blood test. Sexually transmitted disease (STD) testing. Diabetes screening. This is done by checking your blood sugar (glucose) after you have not eaten for a while (fasting). You may have this done every 1-3 years. Bone density scan. This is done to screen for osteoporosis. You may have this done starting at age 38. Mammogram. This may be done every 1-2 years. Talk to your health care provider about how often you should have regular mammograms. Talk with your health care provider about your test results, treatment options, and if necessary, the need for more tests. Vaccines  Your health care provider may recommend  certain vaccines, such as: Influenza vaccine. This is recommended every year. Tetanus, diphtheria, and acellular pertussis (Tdap, Td) vaccine. You may need a Td booster every 10 years. Zoster vaccine. You may need this after age 48. Pneumococcal 13-valent conjugate (PCV13) vaccine. One dose is recommended after age  49. Pneumococcal polysaccharide (PPSV23) vaccine. One dose is recommended after age 72. Talk to your health care provider about which screenings and vaccines you need and how often you need them. This information is not intended to replace advice given to you by your health care provider. Make sure you discuss any questions you have with your health care provider. Document Released: 06/23/2015 Document Revised: 02/14/2016 Document Reviewed: 03/28/2015 Elsevier Interactive Patient Education  2017 [place] Prevention in the Home Falls can cause injuries. They can happen to people of all ages. There are many things you can do to make your home safe and to help prevent falls. What can I do on the outside of my home? Regularly fix the edges of walkways and driveways and fix any cracks. Remove anything that might make you trip as you walk through a door, such as a raised step or threshold. Trim any bushes or trees on the path to your home. Use bright outdoor lighting. Clear any walking paths of anything that might make someone trip, such as rocks or tools. Regularly check to see if handrails are loose or broken. Make sure that both sides of any steps have handrails. Any raised decks and porches should have guardrails on the edges. Have any leaves, snow, or ice cleared regularly. Use sand or salt on walking paths during winter. Clean up any spills in your garage right away. This includes oil or grease spills. What can I do in the bathroom? Use night lights. Install grab bars by the toilet and in the tub and shower. Do not use towel bars as grab bars. Use non-skid mats or decals in the tub or shower. If you need to sit down in the shower, use a plastic, non-slip stool. Keep the floor dry. Clean up any water that spills on the floor as soon as it happens. Remove soap buildup in the tub or shower regularly. Attach bath mats securely with double-sided non-slip rug tape. Do not have throw  rugs and other things on the floor that can make you trip. What can I do in the bedroom? Use night lights. Make sure that you have a light by your bed that is easy to reach. Do not use any sheets or blankets that are too big for your bed. They should not hang down onto the floor. Have a firm chair that has side arms. You can use this for support while you get dressed. Do not have throw rugs and other things on the floor that can make you trip. What can I do in the kitchen? Clean up any spills right away. Avoid walking on wet floors. Keep items that you use a lot in easy-to-reach places. If you need to reach something above you, use a strong step stool that has a grab bar. Keep electrical cords out of the way. Do not use floor polish or wax that makes floors slippery. If you must use wax, use non-skid floor wax. Do not have throw rugs and other things on the floor that can make you trip. What can I do with my stairs? Do not leave any items on the stairs. Make sure that there are handrails on both sides of the  stairs and use them. Fix handrails that are broken or loose. Make sure that handrails are as long as the stairways. Check any carpeting to make sure that it is firmly attached to the stairs. Fix any carpet that is loose or worn. Avoid having throw rugs at the top or bottom of the stairs. If you do have throw rugs, attach them to the floor with carpet tape. Make sure that you have a light switch at the top of the stairs and the bottom of the stairs. If you do not have them, ask someone to add them for you. What else can I do to help prevent falls? Wear shoes that: Do not have high heels. Have rubber bottoms. Are comfortable and fit you well. Are closed at the toe. Do not wear sandals. If you use a stepladder: Make sure that it is fully opened. Do not climb a closed stepladder. Make sure that both sides of the stepladder are locked into place. Ask someone to hold it for you, if  possible. Clearly mark and make sure that you can see: Any grab bars or handrails. First and last steps. Where the edge of each step is. Use tools that help you move around (mobility aids) if they are needed. These include: Canes. Walkers. Scooters. Crutches. Turn on the lights when you go into a dark area. Replace any light bulbs as soon as they burn out. Set up your furniture so you have a clear path. Avoid moving your furniture around. If any of your floors are uneven, fix them. If there are any pets around you, be aware of where they are. Review your medicines with your doctor. Some medicines can make you feel dizzy. This can increase your chance of falling. Ask your doctor what other things that you can do to help prevent falls. This information is not intended to replace advice given to you by your health care provider. Make sure you discuss any questions you have with your health care provider. Document Released: 03/23/2009 Document Revised: 11/02/2015 Document Reviewed: 07/01/2014 Elsevier Interactive Patient Education  2017 [employer].

## 2022-10-07 NOTE — Progress Notes (Signed)
Subjective:  Pt completed ADLs, Fall risk, and SDOH during e-check in on 09/30/22.  Answers verified with pt.    Patricia Lee is a 77 y.o. female who presents for Medicare Annual (Subsequent) preventive examination.  I connected with  Patricia Lee on 10/07/22 by a audio enabled telemedicine application and verified that I am speaking with the correct person using two identifiers.  Patient Location: Home  Provider Location: Office/Clinic  I discussed the limitations of evaluation and management by telemedicine. The patient expressed understanding and agreed to proceed.   Review of Systems     Cardiac Risk Factors include: advanced age (>11men, >73 women);dyslipidemia;hypertension     Objective:    Today's Vitals   10/07/22 1302  BP: 139/65  Weight: 180 lb (81.6 kg)  Height: 5\' 6"  (1.676 m)   Body mass index is 29.05 kg/m.     10/07/2022    1:04 PM 09/27/2021   11:54 AM 09/28/2020    9:19 PM 06/15/2020    9:03 AM 04/29/2019    9:38 AM 04/09/2016   11:00 AM 04/09/2016    5:24 AM  Advanced Directives  Does Patient Have a Medical Advance Directive? Yes Yes Yes Yes Yes Yes Yes  Type of Estate agent of [place];Living will Healthcare Power of [place];Living will  Healthcare Power of [place];Living will Healthcare Power of [place];Living will Healthcare Power of [employer] Power of Attorney  Does patient want to make changes to medical advance directive? No - Patient declined    No - Patient declined No - Patient declined   Copy of Healthcare Power of Attorney in Chart? Yes - validated most recent copy scanned in chart (See row information) No - copy requested  No - copy requested No - copy requested No - copy requested No - copy requested    Current Medications (verified) Outpatient Encounter Medications as of 10/07/2022  Medication Sig   amLODipine (NORVASC) 2.5 MG tablet Take 1 tablet (2.5 mg total) by mouth in the morning and at bedtime.    atorvastatin (LIPITOR) 10 MG tablet Take 1 tablet (10 mg total) by mouth daily.   betamethasone dipropionate 0.05 % cream Apply topically 2 (two) times daily.   chlorthalidone (HYGROTON) 25 MG tablet Take 1 tablet (25 mg total) by mouth daily.   Cholecalciferol (VITAMIN D) 50 MCG (2000 UT) tablet    gabapentin (NEURONTIN) 300 MG capsule Take 300 mg by mouth 3 (three) times daily.   Multiple Vitamins-Minerals (MULTIVITAMIN WITH MINERALS) tablet Take 1 tablet by mouth daily.   Omega-3 Fatty Acids ([employer] FISH OIL) 1200 MG CAPS Take 1,200 mg by mouth daily.   polycarbophil (FIBERCON) 625 MG tablet Take 625 mg by mouth daily.   Propylene Glycol-Glycerin 0.6-0.6 % SOLN Apply 2 drops to eye daily as needed (dry eyes).   Turmeric 500 MG CAPS Take 500 mg by mouth daily.   No facility-administered encounter medications on file as of 10/07/2022.    Allergies (verified) Codeine, Erythromycin, Influenza vaccines, Prednisone, and Tetracyclines & related   History: Past Medical History:  Diagnosis Date   Allergy    spring seasonal   Arthritis    hip    Borderline diabetes 12/06/2016   Hyperlipidemia 10/21/2016   Hypertension    Preventative health care 02/19/2022   Past Surgical History:  Procedure Laterality Date   COLONOSCOPY     JOINT REPLACEMENT  Right hip   POLYPECTOMY     TOTAL HIP ARTHROPLASTY Right 04/09/2016  Procedure: RIGHT TOTAL HIP ARTHROPLASTY ANTERIOR APPROACH;  Surgeon: Durene Romans, MD;  Location: [facility];  Service: Orthopedics;  Laterality: Right;   Family History  Problem Relation Age of Onset   Stomach cancer Mother        died at 20   Arthritis Mother    Cancer Mother    Colon cancer Father        died at 53   Cancer Father    Heart failure Maternal Grandmother    Cancer Paternal Grandmother    Colon polyps Neg Hx    Rectal cancer Neg Hx    Esophageal cancer Neg Hx    Pancreatic cancer Neg Hx    Liver disease Neg Hx    Social History   Socioeconomic History    Marital status: Widowed    Spouse name: Not on file   Number of children: Not on file   Years of education: Not on file   Highest education level: Not on file  Occupational History   Occupation: retired  Tobacco Use   Smoking status: Former    Packs/day: 0.25    Years: 20.00    Additional pack years: 0.00    Total pack years: 5.00    Types: Cigarettes    Quit date: 06/10/1993    Years since quitting: 29.3   Smokeless tobacco: Never  Substance and Sexual Activity   Alcohol use: Yes    Alcohol/week: 5.0 standard drinks of alcohol    Types: 5 Glasses of wine per week    Comment: wine, 1-2 glasses every other day   Drug use: No   Sexual activity: Not Currently    Birth control/protection: Post-menopausal  Other Topics Concern   Not on file  Social History Narrative   2 sons, one in [place] and one in [place].    Retired from [employer]   Enjo[facility] to the Y 3 times a week   Active in her church   Husband died in 11-10-2012   Social Determinants of Health   Financial Resource Strain: Low Risk  (09/30/2022)   Overall Financial Resource Strain (CARDIA)    Difficulty of Paying Living Expenses: Not hard at all  Food Insecurity: No Food Insecurity (09/30/2022)   Hunger Vital Sign    Worried About Running Out of Food in the Last Year: Never true    Ran Out of Food in the Last Year: Never true  Transportation Needs: No Transportation Needs (09/30/2022)   PRAPARE - Administrator, Civil Service (Medical): No    Lack of Transportation (Non-Medical): No  Physical Activity: Insufficiently Active (09/30/2022)   Exercise Vital Sign    Days of Exercise per Week: 3 days    Minutes of Exercise per Session: 40 min  Stress: No Stress Concern Present (09/30/2022)   [employer] of Occupational Health - Occupational Stress Questionnaire    Feeling of Stress : Not at all  Social Connections: Unknown (09/30/2022)   Social Connection and Isolation Panel [NHANES]     Frequency of Communication with Friends and Family: More than three times a week    Frequency of Social Gatherings with Friends and Family: Once a week    Attends Religious Services: Not on Insurance claims handler of Clubs or Organizations: Yes    Attends Banker Meetings: More than 4 times per year    Marital Status: Widowed    Tobacco Counseling Counseling given: Not Answered   Clinical  Intake:  Pre-visit preparation completed: Yes  Pain : No/denies pain  BMI - recorded: 29.05 Nutritional Status: BMI 25 -29 Overweight Nutritional Risks: None Diabetes: No  How often do you need to have someone help you when you read instructions, pamphlets, or other written materials from your doctor or pharmacy?: 1 - Never   Activities of Daily Living    09/30/2022   11:34 AM  In your present state of health, do you have any difficulty performing the following activities:  Hearing? 0  Vision? 0  Difficulty concentrating or making decisions? 0  Walking or climbing stairs? 0  Dressing or bathing? 0  Doing errands, shopping? 0  Preparing Food and eating ? N  Using the Toilet? N  In the past six months, have you accidently leaked urine? N  Do you have problems with loss of bowel control? N  Managing your Medications? N  Managing your Finances? N  Housekeeping or managing your Housekeeping? N    Patient Care Team: Sandford Craze, NP as PCP - General (Internal Medicine)  Indicate any recent Medical Services you may have received from other than [facility] providers in the past year (date may be approximate).     Assessment:   This is a routine wellness examination for [place].  Hearing/Vision screen No results found.  Dietary issues and exercise activities discussed: Current Exercise Habits: Home exercise routine, Type of exercise: Other - see comments (swim, deep water aerobics), Time (Minutes): 40, Frequency (Times/Week): 3, Weekly Exercise (Minutes/Week): 120,  Intensity: Moderate, Exercise limited by: None identified   Goals Addressed   None    Depression Screen    10/07/2022    1:04 PM 08/20/2022    9:03 AM 02/19/2022    9:08 AM 09/27/2021   11:51 AM 08/14/2021    9:02 AM 06/15/2020    9:05 AM 04/29/2019    9:44 AM  PHQ 2/9 Scores  PHQ - 2 Score 0 0 0 0 0 0 0    Fall Risk    09/30/2022   11:34 AM 08/20/2022    9:02 AM 02/19/2022    9:08 AM 09/27/2021   11:55 AM 08/14/2021    9:03 AM  Fall Risk   Falls in the past year? 0 0 0 1 1  Number falls in past yr: 0 0 0 0 0  Injury with Fall? 0 0 0 1 1  Risk for fall due to : No Fall Risks No Fall Risks  History of fall(s)   Follow up Falls evaluation completed Falls evaluation completed  Falls prevention discussed     FALL RISK PREVENTION PERTAINING TO THE HOME:  Any stairs in or around the home? No  Home free of loose throw rugs in walkways, pet beds, electrical cords, etc? Yes  Adequate lighting in your home to reduce risk of falls? Yes   ASSISTIVE DEVICES UTILIZED TO PREVENT FALLS:  Life alert? No  Use of a cane, walker or w/c? No  Grab bars in the bathroom? Yes  Shower chair or bench in shower?  Built in seat Elevated toilet seat or a handicapped toilet?  Comfort height  TIMED UP AND GO:  Was the test performed?  No, audio visit .    Cognitive Function:        10/07/2022    1:08 PM 09/27/2021   11:59 AM 06/15/2020    9:12 AM 04/29/2019    9:42 AM  6CIT Screen  What Year? 0 points 0 points 0 points 0  points  What month? 0 points 0 points 0 points   What time? 0 points 0 points 0 points 0 points  Count back from 20 0 points 0 points 0 points 0 points  Months in reverse 0 points 0 points 0 points 0 points  Repeat phrase 0 points 0 points 0 points 0 points  Total Score 0 points 0 points 0 points     Immunizations Immunization History  Administered Date(s) Administered   Influenza Whole 02/26/2010, 03/14/2011   Influenza-Unspecified 02/08/2013, 03/22/2014   PFIZER  Comirnaty(Gray Top)Covid-19 Tri-Sucrose Vaccine 12/18/2020, 03/22/2022   PFIZER(Purple Top)SARS-COV-2 Vaccination 07/07/2019, 07/28/2019, 03/13/2020, 07/17/2020   Pfizer Covid-19 Vaccine Bivalent Booster 36yrs & up 03/21/2021   Pneumococcal Conjugate-13 08/19/2013   Pneumococcal Polysaccharide-23 11/17/2008, 01/13/2018   Td 07/04/2005   Tdap 02/28/2016   Zoster, Live 02/26/2010    TDAP status: Up to date  Flu Vaccine status: Up to date  Pneumococcal vaccine status: Up to date  Covid-19 vaccine status: Information provided on how to obtain vaccines.   Qualifies for Shingles Vaccine? Yes   Zostavax completed Yes   Shingrix Completed?: No.    Education has been provided regarding the importance of this vaccine. Patient has been advised to call insurance company to determine out of pocket expense if they have not yet received this vaccine. Advised may also receive vaccine at local pharmacy or Health Dept. Verbalized acceptance and understanding.  Screening Tests Health Maintenance  Topic Date Due   Hepatitis C Screening  Never done   COVID-19 Vaccine (8 - 2023-24 season) 05/17/2022   Medicare Annual Wellness (AWV)  09/28/2022   Zoster Vaccines- Shingrix (1 of 2) 11/20/2022 (Originally 07/11/1995)   DTaP/Tdap/Td (3 - Td or Tdap) 02/27/2026   Pneumonia Vaccine 34+ Years old  Completed   HPV VACCINES  Aged Out   INFLUENZA VACCINE  Discontinued   DEXA SCAN  Discontinued   COLONOSCOPY (Pts 45-21yrs Insurance coverage will need to be confirmed)  Discontinued    Health Maintenance  Health Maintenance Due  Topic Date Due   Hepatitis C Screening  Never done   COVID-19 Vaccine (8 - 2023-24 season) 05/17/2022   Medicare Annual Wellness (AWV)  09/28/2022    Colorectal cancer screening: No longer required.   Mammogram status: Completed 12/06/21. Repeat every year  Bone Density status: Pt declined.  Lung Cancer Screening: (Low Dose CT Chest recommended if Age 36-80 years, 30 pack-year  currently smoking OR have quit w/in 15years.) does not qualify.   Additional Screening:  Hepatitis C Screening: does qualify; Completed N/a  Vision Screening: Recommended annual ophthalmology exams for early detection of glaucoma and other disorders of the eye. Is the patient up to date with their annual eye exam?  Yes  Who is the provider or what is the name of the office in which the patient attends annual eye exams? Dr. Monte Fantasia If pt is not established with a provider, would they like to be referred to a provider to establish care? No .   Dental Screening: Recommended annual dental exams for proper oral hygiene  Community Resource Referral / Chronic Care Management: CRR required this visit?  No   CCM required this visit?  No      Plan:     I have personally reviewed and noted the following in the patient's chart:   Medical and social history Use of alcohol, tobacco or illicit drugs  Current medications and supplements including opioid prescriptions. Patient is not currently taking opioid prescriptions.  Functional ability and status Nutritional status Physical activity Advanced directives List of other physicians Hospitalizations, surgeries, and ER visits in previous 12 months Vitals Screenings to include cognitive, depression, and falls Referrals and appointments  In addition, I have reviewed and discussed with patient certain preventive protocols, quality metrics, and best practice recommendations. A written personalized care plan for preventive services as well as general preventive health recommendations were provided to patient.   Due to this being a telephonic visit, the after visit summary with patients personalized plan was offered to patient via mail or my-chart. Patient would like to access on my-chart.  Donne Anon, [place]   10/07/2022   Nurse Notes: None

## 2022-11-21 ENCOUNTER — Other Ambulatory Visit: Payer: Self-pay | Admitting: Family

## 2022-12-19 DIAGNOSIS — L814 Other melanin hyperpigmentation: Secondary | ICD-10-CM | POA: Diagnosis not present

## 2022-12-19 DIAGNOSIS — D2372 Other benign neoplasm of skin of left lower limb, including hip: Secondary | ICD-10-CM | POA: Diagnosis not present

## 2022-12-19 DIAGNOSIS — L308 Other specified dermatitis: Secondary | ICD-10-CM | POA: Diagnosis not present

## 2022-12-19 DIAGNOSIS — L821 Other seborrheic keratosis: Secondary | ICD-10-CM | POA: Diagnosis not present

## 2022-12-24 DIAGNOSIS — Z1231 Encounter for screening mammogram for malignant neoplasm of breast: Secondary | ICD-10-CM | POA: Diagnosis not present

## 2022-12-24 LAB — HM MAMMOGRAPHY

## 2022-12-26 ENCOUNTER — Ambulatory Visit: Payer: Medicare Other | Admitting: Family

## 2022-12-26 ENCOUNTER — Encounter: Payer: Self-pay | Admitting: Family

## 2022-12-26 ENCOUNTER — Other Ambulatory Visit (HOSPITAL_BASED_OUTPATIENT_CLINIC_OR_DEPARTMENT_OTHER): Payer: Self-pay

## 2022-12-26 VITALS — BP 142/80 | HR 85 | Ht 66.0 in | Wt 182.4 lb

## 2022-12-26 DIAGNOSIS — H109 Unspecified conjunctivitis: Secondary | ICD-10-CM | POA: Diagnosis not present

## 2022-12-26 MED ORDER — OFLOXACIN 0.3 % OP SOLN
1.0000 [drp] | Freq: Four times a day (QID) | OPHTHALMIC | 0 refills | Status: DC
  Filled 2022-12-26: qty 10, 50d supply, fill #0

## 2022-12-26 NOTE — Progress Notes (Signed)
Patricia Lee is a 77 y.o. 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 100 tablet 2   betamethasone dipropionate 0.05 % cream Apply topically 2 (two) times daily. 30 g 0   chlorthalidone (HYGROTON) 25 MG tablet Take 1 tablet (25 mg total) by mouth daily. 100 tablet 1   Cholecalciferol (VITAMIN D) 50 MCG (2000 UT) tablet      gabapentin (NEURONTIN) 300 MG capsule Take 300 mg by mouth 3 (three) times daily.     Multiple Vitamins-Minerals (MULTIVITAMIN WITH MINERALS) tablet Take 1 tablet by mouth daily.     ofloxacin (OCUFLOX) 0.3 % ophthalmic solution Place 1 drop into both eyes every 6 (six) hours. 10 mL 0   Omega-3 Fatty Acids ([employer]S FISH OIL) 1200 MG CAPS Take 1,200 mg by mouth daily.     polycarbophil (FIBERCON) 625 MG tablet Take 625 mg by mouth daily.     Propylene Glycol-Glycerin 0.6-0.6 % SOLN Apply 2 drops to eye daily as needed (dry eyes).     Turmeric 500 MG CAPS Take 500 mg by mouth daily.     No current facility-administered medications for this visit.    Allergies: Codeine, Erythromycin, Influenza vaccines, Prednisone, and Tetracyclines & related  Past Medical History:  Diagnosis Date   Allergy    spring seasonal   Arthritis    hip    Borderline diabetes 12/06/2016    Hyperlipidemia 10/21/2016   Hypertension    Preventative health care 02/19/2022    Past Surgical History:  Procedure Laterality Date   COLONOSCOPY     JOINT REPLACEMENT  Right hip   POLYPECTOMY     TOTAL HIP ARTHROPLASTY Right 04/09/2016   Procedure: RIGHT TOTAL HIP ARTHROPLASTY ANTERIOR APPROACH;  Surgeon: Durene Romans, MD;  Location: [facility];  Service: Orthopedics;  Laterality: Right;    Family History  Problem Relation Age of Onset   Stomach cancer Mother        died at 21   Arthritis Mother    Cancer Mother    Colon cancer Father        died at 47   Cancer Father    Heart failure Maternal Grandmother    Cancer Paternal Grandmother    Colon polyps Neg Hx    Rectal cancer Neg Hx    Esophageal cancer Neg Hx    Pancreatic cancer Neg Hx    Liver disease Neg Hx     Social History   Tobacco Use   Smoking status: Former    Current packs/day: 0.00    Average packs/day: 0.3 packs/day for 20.0 years (5.0 ttl pk-yrs)    Types: Cigarettes    Start date: 06/10/1973    Quit date: 06/10/1993    Years since quitting: 29.5   Smokeless tobacco: Never  Substance Use Topics   Alcohol use: Yes    Alcohol/week: 5.0 standard drinks of alcohol    Types: 5 Glasses of wine per week    Comment: wine, 1-2 glasses every other day    Subjective:   Patient concerned for bilateral itchy eyes x 10 days- no vision changes, no eye pain; has tried OTC ophthalmic antihistamine with no relief; became concerned this morning when "crusting" in both eyes; no redness, no drainage, not matted shut;   Objective:   Vitals:   12/26/22 1340  BP: (!) 142/80  Pulse: 85  SpO2: 98%  Weight: 182 lb 6.4 oz (82.7 kg)  Height: 5\' 6"  (1.676 m)    General: Well developed, well nourished, in no acute distress  Skin : Warm and dry.  Head: Normocephalic and atraumatic  Eyes: Sclera and conjunctiva clear; pupils round and reactive to light; extraocular movements intact  Lungs: Respirations unlabored;  Neurologic:  Alert and oriented; speech intact; face symmetrical; moves all extremities well; CNII-XII intact without focal deficit   Assessment:  1. Conjunctivitis of both eyes, unspecified conjunctivitis type     Plan:  Limited relief with antihistamine eye drops; will try treating with antibiotic eye drops but if no improvement in the next 2-3 days, to see her optometrist;   No follow-ups on file.  No orders of the defined types were placed in this encounter.   Requested Prescriptions   Signed Prescriptions Disp Refills   ofloxacin (OCUFLOX) 0.3 % ophthalmic solution 10 mL 0    Sig: Place 1 drop into both eyes every 6 (six) hours.

## 2022-12-27 ENCOUNTER — Telehealth: Payer: Self-pay

## 2022-12-27 ENCOUNTER — Other Ambulatory Visit (HOSPITAL_BASED_OUTPATIENT_CLINIC_OR_DEPARTMENT_OTHER): Payer: Self-pay

## 2022-12-27 MED ORDER — PAXLOVID (150/100) 10 X 150 MG & 10 X 100MG PO TBPK
ORAL_TABLET | ORAL | 0 refills | Status: DC
  Filled 2022-12-27: qty 20, 5d supply, fill #0

## 2022-12-27 NOTE — Telephone Encounter (Signed)
Rx requested

## 2022-12-27 NOTE — Telephone Encounter (Signed)
Provider spoke to patient and sent rx

## 2022-12-27 NOTE — Telephone Encounter (Signed)
Pt is calling to see if rx can be called in. Saw Vernona Rieger yesterday. She would like to use the pharmacy downstairs.

## 2022-12-27 NOTE — Telephone Encounter (Signed)
Spoke to pt and advised her that I sent paxlovid to her pharmacy. She reports HA, chills.  We discussed the quarantine protocol.

## 2022-12-27 NOTE — Telephone Encounter (Signed)
Pt states that she was in the office yesterday and saw Vernona Rieger for her eyes, Pt wasn't feeling well and took a home test for COVID and tested positive. She is asking if there is anything she can do to recover quickly. Please advise.

## 2022-12-27 NOTE — Addendum Note (Signed)
Addended by: Sandford Craze on: 12/27/2022 04:57 PM   Modules accepted: Orders

## 2023-01-01 ENCOUNTER — Emergency Department (HOSPITAL_BASED_OUTPATIENT_CLINIC_OR_DEPARTMENT_OTHER): Payer: Medicare Other

## 2023-01-01 ENCOUNTER — Other Ambulatory Visit: Payer: Self-pay

## 2023-01-01 ENCOUNTER — Observation Stay (HOSPITAL_BASED_OUTPATIENT_CLINIC_OR_DEPARTMENT_OTHER): Payer: Medicare Other

## 2023-01-01 ENCOUNTER — Encounter: Payer: Self-pay | Admitting: Family

## 2023-01-01 ENCOUNTER — Emergency Department (HOSPITAL_BASED_OUTPATIENT_CLINIC_OR_DEPARTMENT_OTHER)
Admission: EM | Admit: 2023-01-01 | Discharge: 2023-01-01 | Disposition: A | Discharge status: Home / Self Care | Payer: Medicare Other | Attending: Emergency Medicine | Admitting: Emergency Medicine

## 2023-01-01 ENCOUNTER — Encounter (HOSPITAL_BASED_OUTPATIENT_CLINIC_OR_DEPARTMENT_OTHER): Payer: Self-pay | Admitting: Pediatrics

## 2023-01-01 DIAGNOSIS — I10 Essential (primary) hypertension: Secondary | ICD-10-CM | POA: Diagnosis not present

## 2023-01-01 DIAGNOSIS — I6782 Cerebral ischemia: Secondary | ICD-10-CM | POA: Diagnosis not present

## 2023-01-01 DIAGNOSIS — R531 Weakness: Secondary | ICD-10-CM | POA: Diagnosis present

## 2023-01-01 DIAGNOSIS — R29818 Other symptoms and signs involving the nervous system: Secondary | ICD-10-CM | POA: Diagnosis not present

## 2023-01-01 DIAGNOSIS — E876 Hypokalemia: Secondary | ICD-10-CM | POA: Insufficient documentation

## 2023-01-01 DIAGNOSIS — R42 Dizziness and giddiness: Secondary | ICD-10-CM

## 2023-01-01 DIAGNOSIS — E871 Hypo-osmolality and hyponatremia: Secondary | ICD-10-CM | POA: Diagnosis not present

## 2023-01-01 DIAGNOSIS — Z87891 Personal history of nicotine dependence: Secondary | ICD-10-CM | POA: Insufficient documentation

## 2023-01-01 DIAGNOSIS — E1165 Type 2 diabetes mellitus with hyperglycemia: Secondary | ICD-10-CM | POA: Diagnosis not present

## 2023-01-01 DIAGNOSIS — Z79899 Other long term (current) drug therapy: Secondary | ICD-10-CM | POA: Insufficient documentation

## 2023-01-01 DIAGNOSIS — R55 Syncope and collapse: Secondary | ICD-10-CM | POA: Diagnosis not present

## 2023-01-01 DIAGNOSIS — U071 COVID-19: Secondary | ICD-10-CM | POA: Insufficient documentation

## 2023-01-01 DIAGNOSIS — Z96641 Presence of right artificial hip joint: Secondary | ICD-10-CM | POA: Diagnosis not present

## 2023-01-01 LAB — URINALYSIS, ROUTINE W REFLEX MICROSCOPIC
Bilirubin Urine: NEGATIVE
Glucose, UA: NEGATIVE mg/dL
Hgb urine dipstick: NEGATIVE
Ketones, ur: NEGATIVE mg/dL
Leukocytes,Ua: NEGATIVE
Nitrite: NEGATIVE
Protein, ur: NEGATIVE mg/dL
Specific Gravity, Urine: 1.01 (ref 1.005–1.030)
pH: 7 (ref 5.0–8.0)

## 2023-01-01 LAB — BASIC METABOLIC PANEL
Anion gap: 16 — ABNORMAL HIGH (ref 5–15)
BUN: 16 mg/dL (ref 8–23)
CO2: 29 mmol/L (ref 22–32)
Calcium: 9.7 mg/dL (ref 8.9–10.3)
Chloride: 78 mmol/L — ABNORMAL LOW (ref 98–111)
Creatinine, Ser: 1.1 mg/dL — ABNORMAL HIGH (ref 0.44–1.00)
GFR, Estimated: 52 mL/min — ABNORMAL LOW (ref >60)
Glucose, Bld: 156 mg/dL — ABNORMAL HIGH (ref 70–99)
Potassium: 3.1 mmol/L — ABNORMAL LOW (ref 3.5–5.1)
Sodium: 123 mmol/L — ABNORMAL LOW (ref 135–145)

## 2023-01-01 LAB — CBC
HCT: 40.6 % (ref 36.0–46.0)
Hemoglobin: 14 g/dL (ref 12.0–15.0)
MCH: 30 pg (ref 26.0–34.0)
MCHC: 34.5 g/dL (ref 30.0–36.0)
MCV: 86.9 fL (ref 80.0–100.0)
Platelets: 265 10*3/uL (ref 150–400)
RBC: 4.67 MIL/uL (ref 3.87–5.11)
RDW: 12.5 % (ref 11.5–15.5)
WBC: 5.4 10*3/uL (ref 4.0–10.5)
nRBC: 0 % (ref 0.0–0.2)

## 2023-01-01 LAB — CBG MONITORING, ED: Glucose-Capillary: 162 mg/dL — ABNORMAL HIGH (ref 70–99)

## 2023-01-01 MED ORDER — LACTATED RINGERS IV BOLUS
1000.0000 mL | Freq: Once | INTRAVENOUS | Status: AC
  Administered 2023-01-01: 1000 mL via INTRAVENOUS

## 2023-01-01 MED ORDER — POTASSIUM CHLORIDE CRYS ER 20 MEQ PO TBCR
40.0000 meq | EXTENDED_RELEASE_TABLET | Freq: Once | ORAL | Status: AC
  Administered 2023-01-01: 40 meq via ORAL
  Filled 2023-01-01: qty 2

## 2023-01-01 MED ORDER — SODIUM CHLORIDE 0.9 % IV SOLN: INTRAVENOUS | Status: DC

## 2023-01-01 MED ORDER — SODIUM CHLORIDE 0.9 % IV BOLUS
500.0000 mL | Freq: Once | INTRAVENOUS | Status: AC
  Administered 2023-01-01: 500 mL via INTRAVENOUS

## 2023-01-01 NOTE — ED Notes (Signed)
Patient transported to MRI 

## 2023-01-01 NOTE — Telephone Encounter (Signed)
Pt is currently being seen at ED

## 2023-01-01 NOTE — ED Triage Notes (Signed)
Reports + home covid + Friday and was prescribed some Paxlovid by PCP. States she's finished most of the course and Covid symptoms improved, c/o leg weakness and 1 episode of syncope.

## 2023-01-01 NOTE — ED Provider Notes (Signed)
[place] [facility] Provider Note   CSN: [id number] Arrival date & time: 01/01/23  1041     History  Chief Complaint  Patient presents with   Weakness    Patricia Lee is a 77 y.o. female.  Patient with COVID symptoms about a week ago.  Seen on Friday had a positive test primary care doctor started her on Paxlovid.  Patient is completed the course of Paxlovid.  Now feeling a bit worse a lot of fatigue generalized weakness legs feel very weak and also has had headache and dizziness.  No nausea vomiting or diarrhea.  Appetites been poor not taking fluids very well.  Patient is never had COVID before.  Past medical history significant for hypertension hyperlipidemia borderline diabetes.  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  Patient is a former smoker quit in 1995.       Home Medications Prior to Admission medications   Medication Sig Start Date End Date Taking? Authorizing Provider  amLODipine (NORVASC) 2.5 MG tablet TAKE 1 TABLET BY MOUTH IN THE  MORNING AND AT BEDTIME Patient taking differently: Take 2.5 mg by mouth in the morning and at bedtime. 11/21/22  Yes Worthy Rancher B, FNP  atorvastatin (LIPITOR) 10 MG tablet Take 1 tablet (10 mg total) by mouth daily. Patient taking differently: Take 10 mg by mouth at bedtime. 08/20/22  Yes Sandford Craze, NP  betamethasone dipropionate 0.05 % cream Apply topically 2 (two) times daily. Patient taking differently: Apply 1 Application topically as needed (flare up). 08/08/20  Yes Sandford Craze, NP  chlorthalidone (HYGROTON) 25 MG tablet Take 1 tablet (25 mg total) by mouth daily. 08/20/22  Yes Sandford Craze, NP  Cholecalciferol (VITAMIN D) 50 MCG (2000 UT) tablet Take 2,000 Units by mouth daily.   Yes [provider]  gabapentin (NEURONTIN) 300 MG capsule Take 300 mg by mouth as needed (back pain). 09/10/21  Yes [provider]  Multiple  Vitamins-Minerals (MULTIVITAMIN WITH MINERALS) tablet Take 1 tablet by mouth daily.   Yes [provider]  nirmatrelvir & ritonavir (PAXLOVID, 150/100,) 10 x 150 MG & 10 x 100MG  TBPK Take per package instructions 12/27/22  Yes Sandford Craze, NP  ofloxacin (OCUFLOX) 0.3 % ophthalmic solution Place 1 drop into both eyes every 6 (six) hours. 12/26/22  Yes Olive Bass, FNP  Omega-3 Fatty Acids (CVS FISH OIL) 1200 MG CAPS Take 1,200 mg by mouth daily.   Yes [provider]  polycarbophil (FIBERCON) 625 MG tablet Take 625 mg by mouth daily.   Yes [provider]  Turmeric 500 MG CAPS Take 500 mg by mouth daily.   Yes [provider]      Allergies    Codeine, Erythromycin, Influenza vaccines, Prednisone, and Tetracyclines & related    Review of Systems   Review of Systems  Constitutional:  Positive for fatigue. Negative for chills and fever.  HENT:  Negative for ear pain and sore throat.   Eyes:  Negative for pain and visual disturbance.  Respiratory:  Negative for cough and shortness of breath.   Cardiovascular:  Negative for chest pain and palpitations.  Gastrointestinal:  Negative for abdominal pain and vomiting.  Genitourinary:  Negative for dysuria and hematuria.  Musculoskeletal:  Negative for arthralgias and back pain.  Skin:  Negative for color change and rash.  Neurological:  Positive for dizziness and headaches. Negative for seizures, syncope, weakness and numbness.  All other systems  reviewed and are negative.   Physical Exam Updated Vital Signs BP (!) 141/68 (BP Location: Right Arm)   Pulse 63   Temp 98.2 F (36.8 C) (Oral)   Resp 18   Ht 1.676 m (5\' 6" )   Wt 80.7 kg   SpO2 97%   BMI 28.73 kg/m  Physical Exam  ED Results / Procedures / Treatments   Labs (all labs ordered are listed, but only abnormal results are displayed) Labs Reviewed  BASIC METABOLIC PANEL - Abnormal; Notable for the following components:       Result Value   Sodium 123 (*)    Potassium 3.1 (*)    Chloride 78 (*)    Glucose, Bld 156 (*)    Creatinine, Ser 1.10 (*)    GFR, Estimated 52 (*)    Anion gap 16 (*)    All other components within normal limits  CBG MONITORING, ED - Abnormal; Notable for the following components:   Glucose-Capillary 162 (*)    All other components within normal limits  CBC  URINALYSIS, ROUTINE W REFLEX MICROSCOPIC    EKG EKG Interpretation Date/Time:  Wednesday January 01 2023 11:02:10 EDT Ventricular Rate:  72 PR Interval:  168 QRS Duration:  97 QT Interval:  418 QTC Calculation: 458 R Axis:   0  Text Interpretation: Sinus rhythm Confirmed by Vanetta Mulders [phone]) on 01/01/2023 11:29:37 AM  Radiology CT Head Wo Contrast  Result Date: 01/01/2023 CLINICAL DATA:  Neuro deficit, acute stroke suspected. Syncope on Saturday. EXAM: CT HEAD WITHOUT CONTRAST TECHNIQUE: Contiguous axial images were obtained from the base of the skull through the vertex without intravenous contrast. RADIATION DOSE REDUCTION: This exam was performed according to the departmental dose-optimization program which includes automated exposure control, adjustment of the mA and/or kV according to patient size and/or use of iterative reconstruction technique. COMPARISON:  None Available. FINDINGS: Brain: No evidence of acute infarction, hemorrhage, hydrocephalus, extra-axial collection or mass lesion/mass effect. Vascular: No hyperdense vessel or unexpected calcification. Skull: Normal. Negative for fracture or focal lesion. Sinuses/Orbits: No acute finding. Other: None. IMPRESSION: No acute intracranial pathology. Electronically Signed   By: Larose Hires D.O.   On: 01/01/2023 14:42   DG Chest Port 1 View  Result Date: 01/01/2023 CLINICAL DATA:  COVID EXAM: PORTABLE CHEST 1 VIEW COMPARISON:  06/29/2018. FINDINGS: Bilateral lung fields are clear. Bilateral costophrenic angles are clear. Normal cardio-mediastinal silhouette. No acute  osseous abnormalities. The soft tissues are within normal limits. IMPRESSION: No active disease. Electronically Signed   By: Jules Schick M.D.   On: 01/01/2023 12:02    Procedures Procedures    Medications Ordered in ED Medications  0.9 %  sodium chloride infusion (has no administration in time range)  potassium chloride SA (KLOR-CON M) CR tablet 40 mEq (40 mEq Oral Given 01/01/23 1510)  sodium chloride 0.9 % bolus 500 mL (500 mLs Intravenous New Bag/Given 01/01/23 1510)    ED Course/ Medical Decision Making/ A&P                             Medical Decision Making Amount and/or Complexity of Data Reviewed Labs: ordered. Radiology: ordered.  Risk Prescription drug management.   Patient here without any focal neurodeficits.  Workup glucose 162 basic metabolic panel significant for low but hypokalemia with a potassium of 3.1.  Sodium also low at 123 chloride 78 GFR is 52 for creatinine 1.10 CBC no leukocytosis hemoglobin is 14.  Lipids are good at 265.  Urinalysis negative.  Portable chest x-ray no acute disease.  Patient is oxygen saturations are excellent 99%.  CT head without any acute findings.  Based on this we will go ahead and get MRI because of this dizziness.  Which does not seem to be consistent with true vertigo.  This all could be just fatigue from the COVID.  Does have some hypokalemia given 40 mill because of potassium here.  Sodium is also low.  Patient receiving IV fluids.  Patient can be reassessed after MRI.  Patient without any vomiting or diarrhea so would not be losing additional sodium.  But the sodium is low enough that it could represent admission.   Final Clinical Impression(s) / ED Diagnoses Final diagnoses:  COVID  Hypokalemia  Hyponatremia  Dizziness    Rx / DC Orders ED Discharge Orders     None         Vanetta Mulders, MD 01/01/23 1527

## 2023-01-01 NOTE — ED Provider Notes (Signed)
Care of patient received from prior provider at 6:07 PM, please see their note for complete H/P and care plan.  Received handoff per ED course.  Clinical Course as of 01/01/23 1807  Wed Jan 01, 2023  1539 Stable HO from SZ Had C19 last week now S/P AV treatment.  Most symptoms improving. Had dizziness today MRI pending for concomitant CVA. More metabolic and diffuse in nature. Labs substantially volume depleted Hyponatremia trialing fluid bolus. [CC]    Clinical Course User Index [CC] Glyn Ade, MD    Reassessment: Reassessed at bedside.  Patient has been in the emergency department for 7 and half hours.  Her immediate request is for discharge. Her symptoms have grossly improved, dizziness and fatigue are improving with IV fluids.  She does state now that she had substantial diarrhea when she was on the Paxlovid but that this has been improving over the past 2 days.  Likely etiology of her volume depletion.  Given symptomatic improvement, negative objective findings I believe patient is stable for outpatient care and management.  Discussed observation for further evaluation of her hyponatremia, possible need for admission given hyponatremia below 125 but given interval improvement in symptoms and toleration of treatment patient is requesting discharge follow-up in outpatient setting with a PCP which is reasonable at this time.     Glyn Ade, MD 01/01/23 1807

## 2023-01-01 NOTE — ED Notes (Signed)
Pt discharged to home using teachback Method. Discharge instructions have been discussed with patient and/or family members. Pt verbally acknowledges understanding d/c instructions, has been given opportunity for questions to be answered, and endorses comprehension to checkout at registration before leaving.  

## 2023-01-02 ENCOUNTER — Telehealth: Payer: Self-pay | Admitting: Family

## 2023-01-02 NOTE — Telephone Encounter (Signed)
Please contact pt to arrange a hospital follow up with me on Monday.

## 2023-01-02 NOTE — Telephone Encounter (Signed)
Pt called and Patricia Lee scheduled her on Tuesday. Is that okay or do you want me to move it to Monday?

## 2023-01-07 ENCOUNTER — Telehealth: Payer: Self-pay | Admitting: Family

## 2023-01-07 ENCOUNTER — Ambulatory Visit (INDEPENDENT_AMBULATORY_CARE_PROVIDER_SITE_OTHER): Payer: Medicare Other | Admitting: Family

## 2023-01-07 VITALS — BP 139/63 | HR 83 | Temp 98.7°F | Resp 16 | Wt 178.0 lb

## 2023-01-07 DIAGNOSIS — U071 COVID-19: Secondary | ICD-10-CM | POA: Diagnosis not present

## 2023-01-07 DIAGNOSIS — E871 Hypo-osmolality and hyponatremia: Secondary | ICD-10-CM | POA: Insufficient documentation

## 2023-01-07 LAB — CBC WITH DIFFERENTIAL/PLATELET
Basophils Absolute: 0 10*3/uL (ref 0.0–0.1)
Basophils Relative: 0.6 % (ref 0.0–3.0)
Eosinophils Absolute: 0.1 10*3/uL (ref 0.0–0.7)
Eosinophils Relative: 1.4 % (ref 0.0–5.0)
HCT: 38.5 % (ref 36.0–46.0)
Hemoglobin: 12.9 g/dL (ref 12.0–15.0)
Lymphocytes Relative: 14.8 % (ref 12.0–46.0)
Lymphs Abs: 1 10*3/uL (ref 0.7–4.0)
MCHC: 33.5 g/dL (ref 30.0–36.0)
MCV: 89.9 fl (ref 78.0–100.0)
Monocytes Absolute: 1.3 10*3/uL — ABNORMAL HIGH (ref 0.1–1.0)
Monocytes Relative: 18.8 % — ABNORMAL HIGH (ref 3.0–12.0)
Neutro Abs: 4.6 10*3/uL (ref 1.4–7.7)
Neutrophils Relative %: 64.4 % (ref 43.0–77.0)
Platelets: 316 10*3/uL (ref 150.0–400.0)
RBC: 4.29 Mil/uL (ref 3.87–5.11)
RDW: 13 % (ref 11.5–15.5)
WBC: 7.1 10*3/uL (ref 4.0–10.5)

## 2023-01-07 LAB — COMPREHENSIVE METABOLIC PANEL
ALT: 25 U/L (ref 0–35)
AST: 19 U/L (ref 0–37)
Albumin: 4.1 g/dL (ref 3.5–5.2)
Alkaline Phosphatase: 76 U/L (ref 39–117)
BUN: 13 mg/dL (ref 6–23)
CO2: 28 mEq/L (ref 19–32)
Calcium: 9.8 mg/dL (ref 8.4–10.5)
Chloride: 90 mEq/L — ABNORMAL LOW (ref 96–112)
Creatinine, Ser: 0.87 mg/dL (ref 0.40–1.20)
GFR: 64.23 mL/min (ref >60.00)
Glucose, Bld: 124 mg/dL — ABNORMAL HIGH (ref 70–99)
Potassium: 3.7 mEq/L (ref 3.5–5.1)
Sodium: 129 mEq/L — ABNORMAL LOW (ref 135–145)
Total Bilirubin: 0.5 mg/dL (ref 0.2–1.2)
Total Protein: 7 g/dL (ref 6.0–8.3)

## 2023-01-07 MED ORDER — AMLODIPINE BESYLATE 2.5 MG PO TABS: 7.5000 mg | ORAL_TABLET | Freq: Every day | ORAL | Status: DC

## 2023-01-07 NOTE — Telephone Encounter (Signed)
Patient notified of results, provider comments and change of medication. She was scheduled to return in one week for follow up.

## 2023-01-07 NOTE — Telephone Encounter (Signed)
Sodium is improving but still low.  I think her chlorthalidone may be lowering her sodium. I would like her to stop chlorthalidone.  Increase amlodipine from 2.5mg  bid to 7.5mg  once daily. (She can take 3 of her 2.5mg  tabs for now). ollow up in 1 week so we can recheck BP and sodium.

## 2023-01-07 NOTE — Assessment & Plan Note (Signed)
Likely secondary to diarrhea and possibly exacerbated by Paxlovid. Improved with IV fluids in ER and ongoing oral hydration. -Continue oral hydration. -Repeat labs to ensure normalization of sodium and kidney function.

## 2023-01-07 NOTE — Progress Notes (Signed)
Subjective:     Patient ID: Patricia Lee, female    DOB: 1946/01/27, 77 y.o.   MRN: [id number]  Chief Complaint  Patient presents with   Follow-up    Here for ed follow up.    Covid Positive    Tested positive 12/27/22    HPI  Discussed the use of AI scribe software for clinical note transcription with the patient, who gave verbal consent to proceed.  History of Present Illness   The patient, with a recent diagnosis of COVID-19 (diagnosed 7/19), presents for ED follow up.  She initially experienced dizziness, cold sweats, and fatigue, which prompted her to take a COVID-19 test that returned positive. Following the positive test, she started on Paxlovid, which she believes exacerbated her symptoms, particularly causing severe diarrhea. This led to dehydration and an emergency room visit where she received intravenous fluids. Since discontinuing Paxlovid, her diarrhea has resolved.  The patient reports that her energy levels are still low and she is experiencing significant fatigue, requiring frequent naps. She also notes a persistent cough and a feeling of being 'stuffed up,' but denies any fever. She has been hydrating and eating regularly since her ER visit. She also mentions that her sodium levels were low during her ER visit, which she believes may have contributed to her feeling unwell.          Health Maintenance Due  Topic Date Due   Hepatitis C Screening  Never done   Zoster Vaccines- Shingrix (1 of 2) 07/11/1995   COVID-19 Vaccine (8 - 2023-24 season) 05/17/2022    Past Medical History:  Diagnosis Date   Allergy    spring seasonal   Arthritis    hip    Borderline diabetes 12/06/2016   Hyperlipidemia 10/21/2016   Hypertension    Preventative health care 02/19/2022    Past Surgical History:  Procedure Laterality Date   COLONOSCOPY     JOINT REPLACEMENT  Right hip   POLYPECTOMY     TOTAL HIP ARTHROPLASTY Right 04/09/2016   Procedure: RIGHT TOTAL HIP  ARTHROPLASTY ANTERIOR APPROACH;  Surgeon: Durene Romans, MD;  Location: [facility];  Service: Orthopedics;  Laterality: Right;    Family History  Problem Relation Age of Onset   Stomach cancer Mother        died at 58   Arthritis Mother    Cancer Mother    Colon cancer Father        died at 66   Cancer Father    Heart failure Maternal Grandmother    Cancer Paternal Grandmother    Colon polyps Neg Hx    Rectal cancer Neg Hx    Esophageal cancer Neg Hx    Pancreatic cancer Neg Hx    Liver disease Neg Hx     Social History   Socioeconomic History   Marital status: Widowed    Spouse name: Not on file   Number of children: Not on file   Years of education: Not on file   Highest education level: Associate degree: academic program  Occupational History   Occupation: retired  Tobacco Use   Smoking status: Former    Current packs/day: 0.00    Average packs/day: 0.3 packs/day for 20.0 years (5.0 ttl pk-yrs)    Types: Cigarettes    Start date: 06/10/1973    Quit date: 06/10/1993    Years since quitting: 29.5   Smokeless tobacco: Never  Substance and Sexual Activity   Alcohol use: Yes  Alcohol/week: 5.0 standard drinks of alcohol    Types: 5 Glasses of wine per week    Comment: wine, 1-2 glasses every other day   Drug use: No   Sexual activity: Not Currently    Birth control/protection: Post-menopausal  Other Topics Concern   Not on file  Social History Narrative   2 sons, one in [place] and one in [place].    Retired from [employer]   Enjo[facility] to the Y 3 times a week   Active in her church   Husband died in 01/16/13   Social Determinants of Health   Financial Resource Strain: Low Risk  (12/26/2022)   Overall Financial Resource Strain (CARDIA)    Difficulty of Paying Living Expenses: Not hard at all  Food Insecurity: No Food Insecurity (12/26/2022)   Hunger Vital Sign    Worried About Running Out of Food in the Last Year: Never true    Ran Out of Food in  the Last Year: Never true  Transportation Needs: No Transportation Needs (12/26/2022)   PRAPARE - Administrator, Civil Service (Medical): No    Lack of Transportation (Non-Medical): No  Physical Activity: Sufficiently Active (12/26/2022)   Exercise Vital Sign    Days of Exercise per Week: 3 days    Minutes of Exercise per Session: 50 min  Recent Concern: Physical Activity - Insufficiently Active (09/30/2022)   Exercise Vital Sign    Days of Exercise per Week: 3 days    Minutes of Exercise per Session: 40 min  Stress: No Stress Concern Present (12/26/2022)   [employer] of Occupational Health - Occupational Stress Questionnaire    Feeling of Stress : Not at all  Social Connections: Moderately Integrated (12/26/2022)   Social Connection and Isolation Panel [NHANES]    Frequency of Communication with Friends and Family: More than three times a week    Frequency of Social Gatherings with Friends and Family: Twice a week    Attends Religious Services: More than 4 times per year    Active Member of [employer] or Organizations: Yes    Attends Banker Meetings: More than 4 times per year    Marital Status: Widowed  Intimate Partner Violence: Not At Risk (10/07/2022)   Humiliation, Afraid, Rape, and Kick questionnaire    Fear of Current or Ex-Partner: No    Emotionally Abused: No    Physically Abused: No    Sexually Abused: No    Outpatient Medications Prior to Visit  Medication Sig Dispense Refill   amLODipine (NORVASC) 2.5 MG tablet TAKE 1 TABLET BY MOUTH IN THE  MORNING AND AT BEDTIME (Patient taking differently: Take 2.5 mg by mouth in the morning and at bedtime.) 200 tablet 2   atorvastatin (LIPITOR) 10 MG tablet Take 1 tablet (10 mg total) by mouth daily. (Patient taking differently: Take 10 mg by mouth at bedtime.) 100 tablet 2   betamethasone dipropionate 0.05 % cream Apply topically 2 (two) times daily. (Patient taking differently: Apply 1 Application topically  as needed (flare up).) 30 g 0   chlorthalidone (HYGROTON) 25 MG tablet Take 1 tablet (25 mg total) by mouth daily. 100 tablet 1   Cholecalciferol (VITAMIN D) 50 MCG (2000 UT) tablet Take 2,000 Units by mouth daily.     gabapentin (NEURONTIN) 300 MG capsule Take 300 mg by mouth as needed (back pain).     Multiple Vitamins-Minerals (MULTIVITAMIN WITH MINERALS) tablet Take 1 tablet by mouth daily.  nirmatrelvir & ritonavir (PAXLOVID, 150/100,) 10 x 150 MG & 10 x 100MG  TBPK Take per package instructions 20 tablet 0   ofloxacin (OCUFLOX) 0.3 % ophthalmic solution Place 1 drop into both eyes every 6 (six) hours. 10 mL 0   Omega-3 Fatty Acids (CVS FISH OIL) 1200 MG CAPS Take 1,200 mg by mouth daily.     polycarbophil (FIBERCON) 625 MG tablet Take 625 mg by mouth daily.     Turmeric 500 MG CAPS Take 500 mg by mouth daily.     No facility-administered medications prior to visit.    Allergies  Allergen Reactions   Codeine Other (See Comments)    Tachycardia and insomnia   Erythromycin Other (See Comments)    Tachycardia and insomnia   Influenza Vaccines Other (See Comments)    Nerve reaction similar to shingles- all nerves in back on fire    Prednisone Other (See Comments)    Tachycardia and insomnia   Tetracyclines & Related Other (See Comments)    Tachycardia/insomnia    ROS    See HPI Objective:    Physical Exam Constitutional:      General: She is not in acute distress.    Appearance: Normal appearance. She is well-developed.  HENT:     Head: Normocephalic and atraumatic.     Right Ear: External ear normal. There is impacted cerumen.     Left Ear: External ear normal.  Eyes:     General: No scleral icterus. Neck:     Thyroid: No thyromegaly.  Cardiovascular:     Rate and Rhythm: Normal rate and regular rhythm.     Heart sounds: Normal heart sounds. No murmur heard. Pulmonary:     Effort: Pulmonary effort is normal. No respiratory distress.     Breath sounds: Normal  breath sounds. No wheezing.  Musculoskeletal:     Cervical back: Neck supple.  Skin:    General: Skin is warm and dry.  Neurological:     Mental Status: She is alert and oriented to person, place, and time.  Psychiatric:        Mood and Affect: Mood normal.        Behavior: Behavior normal.        Thought Content: Thought content normal.        Judgment: Judgment normal.      BP 139/63 (BP Location: Right Arm, Patient Position: Sitting, Cuff Size: Small)   Pulse 83   Temp 98.7 F (37.1 C) (Oral)   Resp 16   Wt 178 lb (80.7 kg)   SpO2 100%   BMI 28.73 kg/m  Wt Readings from Last 3 Encounters:  01/07/23 178 lb (80.7 kg)  01/01/23 178 lb (80.7 kg)  12/26/22 182 lb 6.4 oz (82.7 kg)       Assessment & Plan:   Problem List Items Addressed This Visit       Unprioritized   Hyponatremia - Primary     Likely secondary to diarrhea and possibly exacerbated by Paxlovid. Improved with IV fluids in ER and ongoing oral hydration. -Continue oral hydration. -Repeat labs to ensure normalization of sodium and kidney function.      Relevant Orders   Comp Met (CMET)   COVID-19    Recent positive test with initial symptoms of dizziness, cold sweats, and fatigue. Subsequent worsening of symptoms with diarrhea and dehydration requiring ER visit. Currently improving but still experiencing fatigue and mild respiratory symptoms. -Continue supportive care with hydration and rest. -Report if cough  worsens or fever develops.      Relevant Orders   CBC w/Diff    I am having Patricia Lee maintain her multivitamin with minerals, CVS Fish Oil, Turmeric, Vitamin D, polycarbophil, betamethasone dipropionate, gabapentin, chlorthalidone, atorvastatin, amLODipine, ofloxacin, and Paxlovid (150/100).  No orders of the defined types were placed in this encounter.

## 2023-01-07 NOTE — Assessment & Plan Note (Signed)
Recent positive test with initial symptoms of dizziness, cold sweats, and fatigue. Subsequent worsening of symptoms with diarrhea and dehydration requiring ER visit. Currently improving but still experiencing fatigue and mild respiratory symptoms. -Continue supportive care with hydration and rest. -Report if cough worsens or fever develops.

## 2023-01-07 NOTE — Patient Instructions (Signed)
VISIT SUMMARY:  During your recent visit, we discussed your ongoing symptoms following your COVID-19 diagnosis. You reported persistent fatigue, a cough, and a feeling of being 'stuffed up.' You also mentioned that you had severe diarrhea while taking Paxlovid, which led to dehydration and a visit to the emergency room. Since stopping Paxlovid, your diarrhea has improved. However, you are still feeling fatigued and require frequent naps.  YOUR PLAN:  -COVID-19: COVID-19 is a viral infection that can cause a range of symptoms. You are advised to continue supportive care, which includes staying hydrated and resting. If your cough worsens or you develop a fever, please let us know.  -DEHYDRATION: Dehydration occurs when your body doesn't have enough fluids. This was likely caused by your severe diarrhea. You should continue to hydrate orally and we will repeat labs to ensure your sodium and kidney function have returned to normal.  -POST-COVID FATIGUE: Post-COVID fatigue is a common condition where you feel extremely tired after recovering from COVID-19. Continue to rest and take care of yourself. If your fatigue worsens or does not gradually improve, please let us know.  INSTRUCTIONS:  Please continue to hydrate and rest. We will repeat your labs to ensure your sodium and kidney function have returned to normal. If your cough worsens, you develop a fever, or your fatigue does not improve, please contact us immediately.

## 2023-01-15 ENCOUNTER — Ambulatory Visit (INDEPENDENT_AMBULATORY_CARE_PROVIDER_SITE_OTHER): Payer: Medicare Other | Admitting: Family

## 2023-01-15 VITALS — BP 136/56 | HR 79 | Resp 16 | Wt 180.0 lb

## 2023-01-15 DIAGNOSIS — E871 Hypo-osmolality and hyponatremia: Secondary | ICD-10-CM | POA: Diagnosis not present

## 2023-01-15 DIAGNOSIS — R7303 Prediabetes: Secondary | ICD-10-CM

## 2023-01-15 DIAGNOSIS — U071 COVID-19: Secondary | ICD-10-CM

## 2023-01-15 DIAGNOSIS — I10 Essential (primary) hypertension: Secondary | ICD-10-CM

## 2023-01-15 LAB — BASIC METABOLIC PANEL
BUN: 18 mg/dL (ref 6–23)
CO2: 27 mEq/L (ref 19–32)
Calcium: 9.5 mg/dL (ref 8.4–10.5)
Chloride: 101 mEq/L (ref 96–112)
Creatinine, Ser: 0.87 mg/dL (ref 0.40–1.20)
GFR: 64.22 mL/min (ref >60.00)
Glucose, Bld: 101 mg/dL — ABNORMAL HIGH (ref 70–99)
Potassium: 5.3 mEq/L — ABNORMAL HIGH (ref 3.5–5.1)
Sodium: 135 mEq/L (ref 135–145)

## 2023-01-15 LAB — HEMOGLOBIN A1C: Hgb A1c MFr Bld: 6.3 % (ref 4.6–6.5)

## 2023-01-15 NOTE — Patient Instructions (Signed)
VISIT SUMMARY:  During your visit, we discussed your recent recovery from COVID-19, the mild increase in ankle swelling since you stopped taking chlorothiazide, your borderline diabetes, and your hypertension which is currently under control with amlodipine. We also talked about your morning dizziness which seems to improve after eating and resting.  YOUR PLAN:  -COVID-19: You have recently recovered from COVID-19 and tested negative. We discussed the natural immunity you now have and the timing for a COVID booster shot.  -ANKLE SWELLING: Your ankle swelling has slightly increased since you stopped taking chlorothiazide. We discussed that your current medication, amlodipine, might also contribute to the swelling. We also talked about the risk of low sodium levels in your blood with diuretic use. No changes in your medications for nw.   -BORDERLINE DIABETES: Your last A1c test in March showed borderline results.   -HYPERTENSION: Your high blood pressure is currently controlled with amlodipine. We plan to refill your prescription at your September appointment.  INSTRUCTIONS:  You should continue your current regimen and monitor for worsening ankle swelling. We will check your sodium, electrolytes, and A1c levels today. Plan to receive your COVID booster shot in early November, unless there are new variants. We will refill your amlodipine prescription at your September appointment.

## 2023-01-15 NOTE — Assessment & Plan Note (Signed)
She has since tested negative.  Feeling much better.  We discussed getting a covid booster in about 3 months.

## 2023-01-15 NOTE — Assessment & Plan Note (Signed)
BP stable.  Continue amlodipine. She has mild swelling of her ankles after coming off of her diuretic.  Will monitor for now.

## 2023-01-15 NOTE — Progress Notes (Signed)
Subjective:     Patient ID: Patricia Lee, female    DOB: 03/10/1946, 77 y.o.   MRN: [id number]  Chief Complaint  Patient presents with   Hyponatremia    Here for follow up   Hypertension    Here for follow up after medication changed due to hyponatremia    Hypertension    Discussed the use of AI scribe software for clinical note transcription with the patient, who gave verbal consent to proceed.  History of Present Illness   The patient, with a history of hypertension managed with amlodipine, presents for follow up.  Last visit we noted some improvement in her hyponatremia but not resolved. We discontinued her diuretic. She notes some mild  ankle swelling. Despite the discomfort, she is able to wear shoes and denies severe pain.   The patient recently recovered from COVID-19 and tested negative. She feels much better.  The patient also has a history of borderline diabetes, with the last A1c test in March showing borderline results. She denies any recent steroid use.      Lab Results  Component Value Date   HGBA1C 6.2 08/20/2022       Health Maintenance Due  Topic Date Due   Hepatitis C Screening  Never done   Zoster Vaccines- Shingrix (1 of 2) 07/11/1995   COVID-19 Vaccine (8 - 2023-24 season) 05/17/2022    Past Medical History:  Diagnosis Date   Allergy    spring seasonal   Arthritis    hip    Borderline diabetes 12/06/2016   Hyperlipidemia 10/21/2016   Hypertension    Preventative health care 02/19/2022    Past Surgical History:  Procedure Laterality Date   COLONOSCOPY     JOINT REPLACEMENT  Right hip   POLYPECTOMY     TOTAL HIP ARTHROPLASTY Right 04/09/2016   Procedure: RIGHT TOTAL HIP ARTHROPLASTY ANTERIOR APPROACH;  Surgeon: Durene Romans, MD;  Location: [facility];  Service: Orthopedics;  Laterality: Right;    Family History  Problem Relation Age of Onset   Stomach cancer Mother        died at 30   Arthritis Mother    Cancer Mother    Colon  cancer Father        died at 45   Cancer Father    Heart failure Maternal Grandmother    Cancer Paternal Grandmother    Colon polyps Neg Hx    Rectal cancer Neg Hx    Esophageal cancer Neg Hx    Pancreatic cancer Neg Hx    Liver disease Neg Hx     Social History   Socioeconomic History   Marital status: Widowed    Spouse name: Not on file   Number of children: Not on file   Years of education: Not on file   Highest education level: Associate degree: academic program  Occupational History   Occupation: retired  Tobacco Use   Smoking status: Former    Current packs/day: 0.00    Average packs/day: 0.3 packs/day for 20.0 years (5.0 ttl pk-yrs)    Types: Cigarettes    Start date: 06/10/1973    Quit date: 06/10/1993    Years since quitting: 29.6   Smokeless tobacco: Never  Substance and Sexual Activity   Alcohol use: Yes    Alcohol/week: 5.0 standard drinks of alcohol    Types: 5 Glasses of wine per week    Comment: wine, 1-2 glasses every other day   Drug use: No  Sexual activity: Not Currently    Birth control/protection: Post-menopausal  Other Topics Concern   Not on file  Social History Narrative   2 sons, one in [place] and one in [place].    Retired from [employer]   Enjoys going to the Y 3 times a week   Active in her church   Husband died in 2013/01/30   Social Determinants of Health   Financial Resource Strain: Low Risk  (12/26/2022)   Overall Financial Resource Strain (CARDIA)    Difficulty of Paying Living Expenses: Not hard at all  Food Insecurity: No Food Insecurity (12/26/2022)   Hunger Vital Sign    Worried About Running Out of Food in the Last Year: Never true    Ran Out of Food in the Last Year: Never true  Transportation Needs: No Transportation Needs (12/26/2022)   PRAPARE - Administrator, Civil Service (Medical): No    Lack of Transportation (Non-Medical): No  Physical Activity: Sufficiently Active (12/26/2022)   Exercise  Vital Sign    Days of Exercise per Week: 3 days    Minutes of Exercise per Session: 50 min  Recent Concern: Physical Activity - Insufficiently Active (09/30/2022)   Exercise Vital Sign    Days of Exercise per Week: 3 days    Minutes of Exercise per Session: 40 min  Stress: No Stress Concern Present (12/26/2022)   [employer] of Occupational Health - Occupational Stress Questionnaire    Feeling of Stress : Not at all  Social Connections: Moderately Integrated (12/26/2022)   Social Connection and Isolation Panel [NHANES]    Frequency of Communication with Friends and Family: More than three times a week    Frequency of Social Gatherings with Friends and Family: Twice a week    Attends Religious Services: More than 4 times per year    Active Member of [employer] or Organizations: Yes    Attends Banker Meetings: More than 4 times per year    Marital Status: Widowed  Intimate Partner Violence: Not At Risk (10/07/2022)   Humiliation, Afraid, Rape, and Kick questionnaire    Fear of Current or Ex-Partner: No    Emotionally Abused: No    Physically Abused: No    Sexually Abused: No    Outpatient Medications Prior to Visit  Medication Sig Dispense Refill   amLODipine (NORVASC) 2.5 MG tablet Take 3 tablets (7.5 mg total) by mouth daily.     atorvastatin (LIPITOR) 10 MG tablet Take 1 tablet (10 mg total) by mouth daily. (Patient taking differently: Take 10 mg by mouth at bedtime.) 100 tablet 2   betamethasone dipropionate 0.05 % cream Apply topically 2 (two) times daily. (Patient taking differently: Apply 1 Application topically as needed (flare up).) 30 g 0   Cholecalciferol (VITAMIN D) 50 MCG (2000 UT) tablet Take 2,000 Units by mouth daily.     gabapentin (NEURONTIN) 300 MG capsule Take 300 mg by mouth as needed (back pain).     Multiple Vitamins-Minerals (MULTIVITAMIN WITH MINERALS) tablet Take 1 tablet by mouth daily.     nirmatrelvir & ritonavir (PAXLOVID, 150/100,) 10 x 150 MG  & 10 x 100MG  TBPK Take per package instructions 20 tablet 0   ofloxacin (OCUFLOX) 0.3 % ophthalmic solution Place 1 drop into both eyes every 6 (six) hours. 10 mL 0   Omega-3 Fatty Acids ([facility] FISH OIL) 1200 MG CAPS Take 1,200 mg by mouth daily.     polycarbophil (FIBERCON) 625 MG tablet  Take 625 mg by mouth daily.     Turmeric 500 MG CAPS Take 500 mg by mouth daily.     No facility-administered medications prior to visit.    Allergies  Allergen Reactions   Codeine Other (See Comments)    Tachycardia and insomnia   Erythromycin Other (See Comments)    Tachycardia and insomnia   Influenza Vaccines Other (See Comments)    Nerve reaction similar to shingles- all nerves in back on fire    Prednisone Other (See Comments)    Tachycardia and insomnia   Tetracyclines & Related Other (See Comments)    Tachycardia/insomnia    ROS     Objective:    Physical Exam Constitutional:      General: She is not in acute distress.    Appearance: Normal appearance. She is well-developed.  HENT:     Head: Normocephalic and atraumatic.     Right Ear: External ear normal.     Left Ear: External ear normal.  Eyes:     General: No scleral icterus. Neck:     Thyroid: No thyromegaly.  Cardiovascular:     Rate and Rhythm: Normal rate and regular rhythm.     Heart sounds: Normal heart sounds. No murmur heard. Pulmonary:     Effort: Pulmonary effort is normal. No respiratory distress.     Breath sounds: Normal breath sounds. No wheezing.  Musculoskeletal:     Cervical back: Neck supple.     Right lower leg: 1+ Edema present.     Left lower leg: 1+ Edema present.  Skin:    General: Skin is warm and dry.  Neurological:     Mental Status: She is alert and oriented to person, place, and time.  Psychiatric:        Mood and Affect: Mood normal.        Behavior: Behavior normal.        Thought Content: Thought content normal.        Judgment: Judgment normal.      BP (!) 136/56 (BP Location:  Right Arm, Patient Position: Sitting, Cuff Size: Small)   Pulse 79   Resp 16   Wt 180 lb (81.6 kg)   SpO2 97%   BMI 29.05 kg/m  Wt Readings from Last 3 Encounters:  01/15/23 180 lb (81.6 kg)  01/07/23 178 lb (80.7 kg)  01/01/23 178 lb (80.7 kg)       Assessment & Plan:   Problem List Items Addressed This Visit       Unprioritized   Hyponatremia - Primary    Had improved some last visit at 129.  Repeat today.        Relevant Orders   Basic Metabolic Panel (BMET)   Essential hypertension    BP stable.  Continue amlodipine. She has mild swelling of her ankles after coming off of her diuretic.  Will monitor for now.       COVID-19    She has since tested negative.  Feeling much better.  We discussed getting a covid booster in about 3 months.       Borderline diabetes    Update A1C, last A1C was ok.       Relevant Orders   HgB A1c    I am having Loriel L. Baxley maintain her multivitamin with minerals, [facility]S Fish Oil, Turmeric, Vitamin D, polycarbophil, betamethasone dipropionate, gabapentin, atorvastatin, ofloxacin, Paxlovid (150/100), and amLODipine.  No orders of the defined types were placed in this encounter.

## 2023-01-15 NOTE — Assessment & Plan Note (Signed)
Had improved some last visit at 129.  Repeat today.

## 2023-01-15 NOTE — Assessment & Plan Note (Signed)
Update A1C, last A1C was ok.

## 2023-01-19 ENCOUNTER — Telehealth: Payer: Self-pay | Admitting: Family

## 2023-01-19 NOTE — Telephone Encounter (Signed)
Sodium has returned to normal.  However potassium is mildly elevated.  Let's repeat bmet in 1 week, dx hyperkalemia.  Sometimes we see this if the blood has broken down in the tube and it is not actually high.

## 2023-01-20 NOTE — Telephone Encounter (Signed)
Pt called to go over labs results. All CMAs were unavailable at the time and advised a message would be sent back to call her at a later time.

## 2023-01-20 NOTE — Telephone Encounter (Signed)
Called patient but no answer, left voice mail for patient to call back.   

## 2023-01-20 NOTE — Telephone Encounter (Signed)
Patient notified of results and scheduled to come back for BMP in one week

## 2023-01-23 ENCOUNTER — Encounter (INDEPENDENT_AMBULATORY_CARE_PROVIDER_SITE_OTHER): Payer: Self-pay

## 2023-01-28 ENCOUNTER — Other Ambulatory Visit (INDEPENDENT_AMBULATORY_CARE_PROVIDER_SITE_OTHER): Payer: Medicare Other

## 2023-01-28 DIAGNOSIS — E875 Hyperkalemia: Secondary | ICD-10-CM

## 2023-01-28 LAB — BASIC METABOLIC PANEL
BUN: 15 mg/dL (ref 6–23)
CO2: 28 mEq/L (ref 19–32)
Calcium: 9.6 mg/dL (ref 8.4–10.5)
Chloride: 103 mEq/L (ref 96–112)
Creatinine, Ser: 0.89 mg/dL (ref 0.40–1.20)
GFR: 62.48 mL/min (ref >60.00)
Glucose, Bld: 101 mg/dL — ABNORMAL HIGH (ref 70–99)
Potassium: 4.9 mEq/L (ref 3.5–5.1)
Sodium: 140 mEq/L (ref 135–145)

## 2023-02-17 DIAGNOSIS — M1712 Unilateral primary osteoarthritis, left knee: Secondary | ICD-10-CM | POA: Diagnosis not present

## 2023-02-25 ENCOUNTER — Encounter: Payer: Self-pay | Admitting: Family

## 2023-02-25 ENCOUNTER — Telehealth: Payer: Self-pay | Admitting: Family

## 2023-02-25 ENCOUNTER — Ambulatory Visit (INDEPENDENT_AMBULATORY_CARE_PROVIDER_SITE_OTHER): Payer: Medicare Other | Admitting: Family

## 2023-02-25 VITALS — BP 139/69 | HR 63 | Temp 97.9°F | Resp 16 | Wt 178.0 lb

## 2023-02-25 DIAGNOSIS — I10 Essential (primary) hypertension: Secondary | ICD-10-CM

## 2023-02-25 DIAGNOSIS — U071 COVID-19: Secondary | ICD-10-CM

## 2023-02-25 DIAGNOSIS — H9319 Tinnitus, unspecified ear: Secondary | ICD-10-CM | POA: Diagnosis not present

## 2023-02-25 DIAGNOSIS — R7303 Prediabetes: Secondary | ICD-10-CM | POA: Diagnosis not present

## 2023-02-25 DIAGNOSIS — E785 Hyperlipidemia, unspecified: Secondary | ICD-10-CM | POA: Diagnosis not present

## 2023-02-25 MED ORDER — AMLODIPINE BESYLATE 2.5 MG PO TABS: 2.5000 mg | ORAL_TABLET | Freq: Two times a day (BID) | ORAL | 1 refills | Status: DC

## 2023-02-25 NOTE — Telephone Encounter (Signed)
Opened in error

## 2023-02-25 NOTE — Assessment & Plan Note (Addendum)
Stable. Continue on Atorvastatin.  Lab Results  Component Value Date   CHOL 184 08/20/2022   HDL 87.10 08/20/2022   LDLCALC 79 08/20/2022   LDLDIRECT 136.2 07/21/2012   TRIG 89.0 08/20/2022   CHOLHDL 2 08/20/2022

## 2023-02-25 NOTE — Assessment & Plan Note (Addendum)
Notes improvement in her LE edema. She is currently maintained on amlodipine 2.5mg  bid.  BP is elevated here today in the office but her home readings are at goal. I have asked her to send me her home reading this evening via mychart.

## 2023-02-25 NOTE — Assessment & Plan Note (Signed)
Mild, intermittent. I have advised her to let me know if symptoms worsen. Monitor for now.

## 2023-02-25 NOTE — Progress Notes (Signed)
Subjective:     Patient ID: Patricia Lee, female    DOB: 08-04-1945, 77 y.o.   MRN: [id number]  Chief Complaint  Patient presents with   Hypertension    Here for follow up   Hyperlipidemia    Here for follow up    Hypertension Pertinent negatives include no chest pain, headaches, palpitations or shortness of breath.  Hyperlipidemia Pertinent negatives include no chest pain, myalgias or shortness of breath.    Discussed the use of AI scribe software for clinical note transcription with the patient, who gave verbal consent to proceed.  77 year old female presents to the office today for follow up for hypertension and hyperlipidemia. She states that she was just seen here last month when she was getting over a covid infection. She states that she is feeling a lot better since that office visit. She states that she is trying to eat healthy and exercise as much as she can.   Went over the shingles vaccine and she states that she got the first one and she is still on the fence about the second one. She states that she will get her covid booster in November. And she states she does not get flu vaccine.    She states that since she had covid over the summer, she has been having ringing in the right ear when she coughs and burps.   Swelling in ankles is doing better since the change in medications with taking the the Amlodipine twice a day.        Health Maintenance Due  Topic Date Due   Hepatitis C Screening  Never done   Zoster Vaccines- Shingrix (1 of 2) 07/11/1995   COVID-19 Vaccine (8 - 2023-24 season) 02/09/2023    Past Medical History:  Diagnosis Date   Allergy    spring seasonal   Arthritis    hip    Borderline diabetes 12/06/2016   Hyperlipidemia 10/21/2016   Hypertension    Preventative health care 02/19/2022    Past Surgical History:  Procedure Laterality Date   COLONOSCOPY     JOINT REPLACEMENT  Right hip   POLYPECTOMY     TOTAL HIP ARTHROPLASTY Right  04/09/2016   Procedure: RIGHT TOTAL HIP ARTHROPLASTY ANTERIOR APPROACH;  Surgeon: Durene Romans, MD;  Location: [facility];  Service: Orthopedics;  Laterality: Right;    Family History  Problem Relation Age of Onset   Stomach cancer Mother        died at 8   Arthritis Mother    Cancer Mother    Colon cancer Father        died at 44   Cancer Father    Heart failure Maternal Grandmother    Cancer Paternal Grandmother    Colon polyps Neg Hx    Rectal cancer Neg Hx    Esophageal cancer Neg Hx    Pancreatic cancer Neg Hx    Liver disease Neg Hx     Social History   Socioeconomic History   Marital status: Widowed    Spouse name: Not on file   Number of children: Not on file   Years of education: Not on file   Highest education level: Associate degree: academic program  Occupational History   Occupation: retired  Tobacco Use   Smoking status: Former    Current packs/day: 0.00    Average packs/day: 0.3 packs/day for 20.0 years (5.0 ttl pk-yrs)    Types: Cigarettes    Start date: 06/10/1973  Quit date: 06/10/1993    Years since quitting: 29.7   Smokeless tobacco: Never  Substance and Sexual Activity   Alcohol use: Yes    Alcohol/week: 5.0 standard drinks of alcohol    Types: 5 Glasses of wine per week    Comment: wine, 1-2 glasses every other day   Drug use: No   Sexual activity: Not Currently    Birth control/protection: Post-menopausal  Other Topics Concern   Not on file  Social History Narrative   2 sons, one in [place] and one in [place].    Retired from [employer]   Enjoys going to the Y 3 times a week   Active in her church   Husband died in 2013/05/22   Social Determinants of Health   Financial Resource Strain: Low Risk  (12/26/2022)   Overall Financial Resource Strain (CARDIA)    Difficulty of Paying Living Expenses: Not hard at all  Food Insecurity: No Food Insecurity (12/26/2022)   Hunger Vital Sign    Worried About Running Out of Food in the Last  Year: Never true    Ran Out of Food in the Last Year: Never true  Transportation Needs: No Transportation Needs (12/26/2022)   PRAPARE - Administrator, Civil Service (Medical): No    Lack of Transportation (Non-Medical): No  Physical Activity: Sufficiently Active (12/26/2022)   Exercise Vital Sign    Days of Exercise per Week: 3 days    Minutes of Exercise per Session: 50 min  Recent Concern: Physical Activity - Insufficiently Active (09/30/2022)   Exercise Vital Sign    Days of Exercise per Week: 3 days    Minutes of Exercise per Session: 40 min  Stress: No Stress Concern Present (12/26/2022)   [employer] of Occupational Health - Occupational Stress Questionnaire    Feeling of Stress : Not at all  Social Connections: Moderately Integrated (12/26/2022)   Social Connection and Isolation Panel [NHANES]    Frequency of Communication with Friends and Family: More than three times a week    Frequency of Social Gatherings with Friends and Family: Twice a week    Attends Religious Services: More than 4 times per year    Active Member of [employer] or Organizations: Yes    Attends Banker Meetings: More than 4 times per year    Marital Status: Widowed  Intimate Partner Violence: Not At Risk (10/07/2022)   Humiliation, Afraid, Rape, and Kick questionnaire    Fear of Current or Ex-Partner: No    Emotionally Abused: No    Physically Abused: No    Sexually Abused: No    Outpatient Medications Prior to Visit  Medication Sig Dispense Refill   amLODipine (NORVASC) 2.5 MG tablet Take 3 tablets (7.5 mg total) by mouth daily.     atorvastatin (LIPITOR) 10 MG tablet Take 1 tablet (10 mg total) by mouth daily. (Patient taking differently: Take 10 mg by mouth at bedtime.) 100 tablet 2   betamethasone dipropionate 0.05 % cream Apply topically 2 (two) times daily. (Patient taking differently: Apply 1 Application topically as needed (flare up).) 30 g 0   Cholecalciferol (VITAMIN D)  50 MCG (2000 UT) tablet Take 2,000 Units by mouth daily.     gabapentin (NEURONTIN) 300 MG capsule Take 300 mg by mouth as needed (back pain).     Multiple Vitamins-Minerals (MULTIVITAMIN WITH MINERALS) tablet Take 1 tablet by mouth daily.     nirmatrelvir & ritonavir (PAXLOVID, 150/100,) 10 x  150 MG & 10 x 100MG  TBPK Take per package instructions 20 tablet 0   ofloxacin (OCUFLOX) 0.3 % ophthalmic solution Place 1 drop into both eyes every 6 (six) hours. 10 mL 0   Omega-3 Fatty Acids ([facility]S FISH OIL) 1200 MG CAPS Take 1,200 mg by mouth daily.     polycarbophil (FIBERCON) 625 MG tablet Take 625 mg by mouth daily.     Turmeric 500 MG CAPS Take 500 mg by mouth daily.     No facility-administered medications prior to visit.    Allergies  Allergen Reactions   Codeine Other (See Comments)    Tachycardia and insomnia   Erythromycin Other (See Comments)    Tachycardia and insomnia   Influenza Vaccines Other (See Comments)    Nerve reaction similar to shingles- all nerves in back on fire    Prednisone Other (See Comments)    Tachycardia and insomnia   Tetracyclines & Related Other (See Comments)    Tachycardia/insomnia    Review of Systems  HENT:  Positive for tinnitus (right ear). Negative for congestion, ear discharge, ear pain and sinus pain.   Eyes:  Negative for double vision.  Respiratory:  Negative for cough, shortness of breath and wheezing.   Cardiovascular:  Negative for chest pain, palpitations and leg swelling.  Gastrointestinal:  Negative for diarrhea, heartburn and nausea.  Musculoskeletal:  Negative for joint pain and myalgias.  Neurological:  Negative for dizziness, tingling and headaches.  Psychiatric/Behavioral:  Negative for depression and suicidal ideas.        Objective:    Physical Exam Vitals reviewed.  Constitutional:      Appearance: Normal appearance.  HENT:     Head: Normocephalic.     Right Ear: Tympanic membrane, ear canal and external ear normal.      Nose: Nose normal.  Cardiovascular:     Rate and Rhythm: Normal rate and regular rhythm.     Pulses: Normal pulses.     Heart sounds: Normal heart sounds.  Pulmonary:     Breath sounds: Normal breath sounds.  Musculoskeletal:     Cervical back: Normal range of motion.  Skin:    General: Skin is warm.  Neurological:     General: No focal deficit present.     Mental Status: She is alert and oriented to person, place, and time. Mental status is at baseline.  Psychiatric:        Mood and Affect: Mood normal.        Behavior: Behavior normal.        Thought Content: Thought content normal.        Judgment: Judgment normal.      BP (!) 153/66 (BP Location: Right Arm, Patient Position: Sitting, Cuff Size: Small)   Pulse 63   Temp 97.9 F (36.6 C) (Oral)   Resp 16   Wt 178 lb (80.7 kg)   SpO2 99%   BMI 28.73 kg/m  Wt Readings from Last 3 Encounters:  02/25/23 178 lb (80.7 kg)  01/15/23 180 lb (81.6 kg)  01/07/23 178 lb (80.7 kg)       Assessment & Plan:   Problem List Items Addressed This Visit   None   I am having Navreet L. Millikan maintain her multivitamin with minerals, [facility] Fish Oil, Turmeric, Vitamin D, polycarbophil, betamethasone dipropionate, gabapentin, atorvastatin, ofloxacin, Paxlovid (150/100), and amLODipine.  No orders of the defined types were placed in this encounter.

## 2023-02-25 NOTE — Patient Instructions (Signed)
VISIT SUMMARY:  During your recent visit, we discussed your ongoing management of hypertension, hyperlipidemia, and diabetes. You mentioned that your blood pressure readings at home have been variable, and we noted an elevated reading during your visit. Your lower extremity swelling has improved with your regular medication, but you noted it worsens when you eat out frequently. You also mentioned a recent recovery from COVID-19 and a new buzzing sensation in your right ear. We also discussed your cholesterol and A1c levels, and your decisions regarding various vaccinations.  YOUR PLAN:  -HYPERTENSION: Hypertension is high blood pressure. We will continue your current medication, Amlodipine, and ask you to monitor your blood pressure at home and send [place] the readings.  -TINNITUS: Tinnitus is a condition where you hear a ringing or buzzing in your ears. We will monitor this new symptom and may refer you to a specialist if it persists.  -HYPERLIPIDEMIA: Hyperlipidemia is high cholesterol. Your cholesterol levels were normal at your last check, so we will continue your current medication, Lipitor.  -Borderline DIABETES MELLITUS: Diabetes Mellitus is a condition that affects your body's ability to use sugar for energy. Your last A1c check showed good control, so we will continue your current management.  -COVID-19: You recently recovered from COVID-19. No further action is required at this time.  INSTRUCTIONS:  Please monitor your blood pressure at home and send [place] the readings via MyChart. Continue taking your current medications as prescribed. If the buzzing in your ear becomes constant, please let us know. We will follow up in 4 months to recheck your blood pressure. In the meantime, please consider your decision on the Shingles vaccination and inform us when you have made a decision. Plan for your COVID-19 booster in November.

## 2023-02-25 NOTE — Assessment & Plan Note (Signed)
Is stable at this time. A1C checked in August

## 2023-02-25 NOTE — Assessment & Plan Note (Signed)
Lab Results  Component Value Date   HGBA1C 6.3 01/15/2023   Last A1C stable. Continue to work on diabetic diet.

## 2023-02-25 NOTE — Assessment & Plan Note (Signed)
Resolved following treatment with paxlovid. No residual symptoms.

## 2023-02-25 NOTE — Progress Notes (Signed)
Subjective:     Patient ID: Patricia Lee, female    DOB: Oct 03, 1945, 77 y.o.   MRN: [id number]  Chief Complaint  Patient presents with   Hypertension    Here for follow up   Hyperlipidemia    Here for follow up    HPI  Discussed the use of AI scribe software for clinical note transcription with the patient, who gave verbal consent to proceed.  History of Present Illness         The patient, with a history of hypertension, hyperlipidemia, and borderline diabetes, presents for a routine follow-up. She reports that her blood pressure readings at home have been variable, with the highest reading being 145 systolic. She also notes that her blood pressure was elevated at the beginning of today's visit. She has been adhering to her prescribed medication regimen, which includes amlodipine.  The patient also reports that her lower extremity swelling has improved significantly, however, she notes that the swelling tends to worsen if she eats out frequently, likely due to increased sodium intake.  In addition, the patient mentions a recent bout of COVID-19, which has since resolved with the use of Paxlovid. However, she has been experiencing a buzzing sensation in her right ear for the past three weeks. The buzzing is not constant and does not seem to affect her hearing, but it is noticeable when she puts her finger in her ear or during certain actions such as burping or hiccupping.  The patient's cholesterol and A1c levels were last checked six months and one month ago, respectively, and were well-controlled on Lipitor. She has not yet received the shingles vaccine and is undecided about whether to do so. She declined a one-time hepatitis C screening.     Health Maintenance Due  Topic Date Due   Zoster Vaccines- Shingrix (1 of 2) 07/11/1995   COVID-19 Vaccine (8 - 2023-24 season) 02/09/2023    Past Medical History:  Diagnosis Date   Allergy    spring seasonal   Arthritis    hip     Borderline diabetes 12/06/2016   Hyperlipidemia 10/21/2016   Hypertension    Preventative health care 02/19/2022    Past Surgical History:  Procedure Laterality Date   COLONOSCOPY     JOINT REPLACEMENT  Right hip   POLYPECTOMY     TOTAL HIP ARTHROPLASTY Right 04/09/2016   Procedure: RIGHT TOTAL HIP ARTHROPLASTY ANTERIOR APPROACH;  Surgeon: Durene Romans, MD;  Location: [facility];  Service: Orthopedics;  Laterality: Right;    Family History  Problem Relation Age of Onset   Stomach cancer Mother        died at 74   Arthritis Mother    Cancer Mother    Colon cancer Father        died at 28   Cancer Father    Heart failure Maternal Grandmother    Cancer Paternal Grandmother    Colon polyps Neg Hx    Rectal cancer Neg Hx    Esophageal cancer Neg Hx    Pancreatic cancer Neg Hx    Liver disease Neg Hx     Social History   Socioeconomic History   Marital status: Widowed    Spouse name: Not on file   Number of children: Not on file   Years of education: Not on file   Highest education level: Associate degree: academic program  Occupational History   Occupation: retired  Tobacco Use   Smoking status: Former  Current packs/day: 0.00    Average packs/day: 0.3 packs/day for 20.0 years (5.0 ttl pk-yrs)    Types: Cigarettes    Start date: 06/10/1973    Quit date: 06/10/1993    Years since quitting: 29.7   Smokeless tobacco: Never  Substance and Sexual Activity   Alcohol use: Yes    Alcohol/week: 5.0 standard drinks of alcohol    Types: 5 Glasses of wine per week    Comment: wine, 1-2 glasses every other day   Drug use: No   Sexual activity: Not Currently    Birth control/protection: Post-menopausal  Other Topics Concern   Not on file  Social History Narrative   2 sons, one in [place] and one in [place].    Retired from [employer]   Enjoys going to the Y 3 times a week   Active in her church   Husband died in 2013/04/30   Social Determinants of Health    Financial Resource Strain: Low Risk  (12/26/2022)   Overall Financial Resource Strain (CARDIA)    Difficulty of Paying Living Expenses: Not hard at all  Food Insecurity: No Food Insecurity (12/26/2022)   Hunger Vital Sign    Worried About Running Out of Food in the Last Year: Never true    Ran Out of Food in the Last Year: Never true  Transportation Needs: No Transportation Needs (12/26/2022)   PRAPARE - Administrator, Civil Service (Medical): No    Lack of Transportation (Non-Medical): No  Physical Activity: Sufficiently Active (12/26/2022)   Exercise Vital Sign    Days of Exercise per Week: 3 days    Minutes of Exercise per Session: 50 min  Recent Concern: Physical Activity - Insufficiently Active (09/30/2022)   Exercise Vital Sign    Days of Exercise per Week: 3 days    Minutes of Exercise per Session: 40 min  Stress: No Stress Concern Present (12/26/2022)   [employer] of Occupational Health - Occupational Stress Questionnaire    Feeling of Stress : Not at all  Social Connections: Moderately Integrated (12/26/2022)   Social Connection and Isolation Panel [NHANES]    Frequency of Communication with Friends and Family: More than three times a week    Frequency of Social Gatherings with Friends and Family: Twice a week    Attends Religious Services: More than 4 times per year    Active Member of [employer] or Organizations: Yes    Attends Banker Meetings: More than 4 times per year    Marital Status: Widowed  Intimate Partner Violence: Not At Risk (10/07/2022)   Humiliation, Afraid, Rape, and Kick questionnaire    Fear of Current or Ex-Partner: No    Emotionally Abused: No    Physically Abused: No    Sexually Abused: No    Outpatient Medications Prior to Visit  Medication Sig Dispense Refill   atorvastatin (LIPITOR) 10 MG tablet Take 1 tablet (10 mg total) by mouth daily. (Patient taking differently: Take 10 mg by mouth at bedtime.) 100 tablet 2    betamethasone dipropionate 0.05 % cream Apply topically 2 (two) times daily. (Patient taking differently: Apply 1 Application topically as needed (flare up).) 30 g 0   Cholecalciferol (VITAMIN D) 50 MCG (2000 UT) tablet Take 2,000 Units by mouth daily.     gabapentin (NEURONTIN) 300 MG capsule Take 300 mg by mouth as needed (back pain).     Multiple Vitamins-Minerals (MULTIVITAMIN WITH MINERALS) tablet Take 1 tablet by mouth  daily.     ofloxacin (OCUFLOX) 0.3 % ophthalmic solution Place 1 drop into both eyes every 6 (six) hours. 10 mL 0   Omega-3 Fatty Acids ([employer] FISH OIL) 1200 MG CAPS Take 1,200 mg by mouth daily.     polycarbophil (FIBERCON) 625 MG tablet Take 625 mg by mouth daily.     Turmeric 500 MG CAPS Take 500 mg by mouth daily.     amLODipine (NORVASC) 2.5 MG tablet Take 3 tablets (7.5 mg total) by mouth daily.     nirmatrelvir & ritonavir (PAXLOVID, 150/100,) 10 x 150 MG & 10 x 100MG  TBPK Take per package instructions 20 tablet 0   No facility-administered medications prior to visit.    Allergies  Allergen Reactions   Codeine Other (See Comments)    Tachycardia and insomnia   Erythromycin Other (See Comments)    Tachycardia and insomnia   Influenza Vaccines Other (See Comments)    Nerve reaction similar to shingles- all nerves in back on fire    Prednisone Other (See Comments)    Tachycardia and insomnia   Tetracyclines & Related Other (See Comments)    Tachycardia/insomnia    ROS See HPI    Objective:    Physical Exam Constitutional:      General: She is not in acute distress.    Appearance: Normal appearance. She is well-developed.  HENT:     Head: Normocephalic and atraumatic.     Right Ear: External ear normal.     Left Ear: External ear normal.  Eyes:     General: No scleral icterus. Neck:     Thyroid: No thyromegaly.  Cardiovascular:     Rate and Rhythm: Normal rate and regular rhythm.     Heart sounds: Normal heart sounds. No murmur heard. Pulmonary:      Effort: Pulmonary effort is normal. No respiratory distress.     Breath sounds: Normal breath sounds. No wheezing.  Musculoskeletal:     Cervical back: Neck supple.  Skin:    General: Skin is warm and dry.  Neurological:     Mental Status: She is alert and oriented to person, place, and time.  Psychiatric:        Mood and Affect: Mood normal.        Behavior: Behavior normal.        Thought Content: Thought content normal.        Judgment: Judgment normal.      BP (!) 153/66 (BP Location: Right Arm, Patient Position: Sitting, Cuff Size: Small)   Pulse 63   Temp 97.9 F (36.6 C) (Oral)   Resp 16   Wt 178 lb (80.7 kg)   SpO2 99%   BMI 28.73 kg/m  Wt Readings from Last 3 Encounters:  02/25/23 178 lb (80.7 kg)  01/15/23 180 lb (81.6 kg)  01/07/23 178 lb (80.7 kg)       Assessment & Plan:   Problem List Items Addressed This Visit       Unprioritized   Tinnitus    Mild, intermittent. I have advised her to let me know if symptoms worsen. Monitor for now.       Hyperlipidemia    Stable. Continue on Atorvastatin.  Lab Results  Component Value Date   CHOL 184 08/20/2022   HDL 87.10 08/20/2022   LDLCALC 79 08/20/2022   LDLDIRECT 136.2 07/21/2012   TRIG 89.0 08/20/2022   CHOLHDL 2 08/20/2022         Relevant Medications  amLODipine (NORVASC) 2.5 MG tablet   Essential hypertension - Primary    Notes improvement in her LE edema. She is currently maintained on amlodipine 2.5mg  bid.  BP is elevated here today in the office but her home readings are at goal. I have asked her to send me her home reading this evening via mychart.       Relevant Medications   amLODipine (NORVASC) 2.5 MG tablet   COVID-19    Resolved following treatment with paxlovid. No residual symptoms.       Borderline diabetes    Lab Results  Component Value Date   HGBA1C 6.3 01/15/2023   Last A1C stable. Continue to work on diabetic diet.        I have discontinued Shirlean Schlein Paxlovid (150/100). I have also changed her amLODipine. Additionally, I am having her maintain her multivitamin with minerals, [facility] Fish Oil, Turmeric, Vitamin D, polycarbophil, betamethasone dipropionate, gabapentin, atorvastatin, and ofloxacin.  Meds ordered this encounter  Medications   amLODipine (NORVASC) 2.5 MG tablet    Sig: Take 1 tablet (2.5 mg total) by mouth 2 (two) times daily.    Dispense:  200 tablet    Refill:  1    Please send a replace/new response with 100-Day Supply if appropriate to maximize member benefit. Requesting 1 year supply.    Order Specific Question:   Supervising Provider    Answer:   Danise Edge A [[id number]]

## 2023-03-11 ENCOUNTER — Other Ambulatory Visit: Payer: Self-pay | Admitting: Family

## 2023-03-31 IMAGING — DX DG KNEE COMPLETE 4+V*R*
4 series · 4 of 4 positions shown · non-contrast
Comparison: None.

CLINICAL DATA: Knee pain.

EXAM:
RIGHT KNEE - COMPLETE 4+ VIEW

[knee ap]
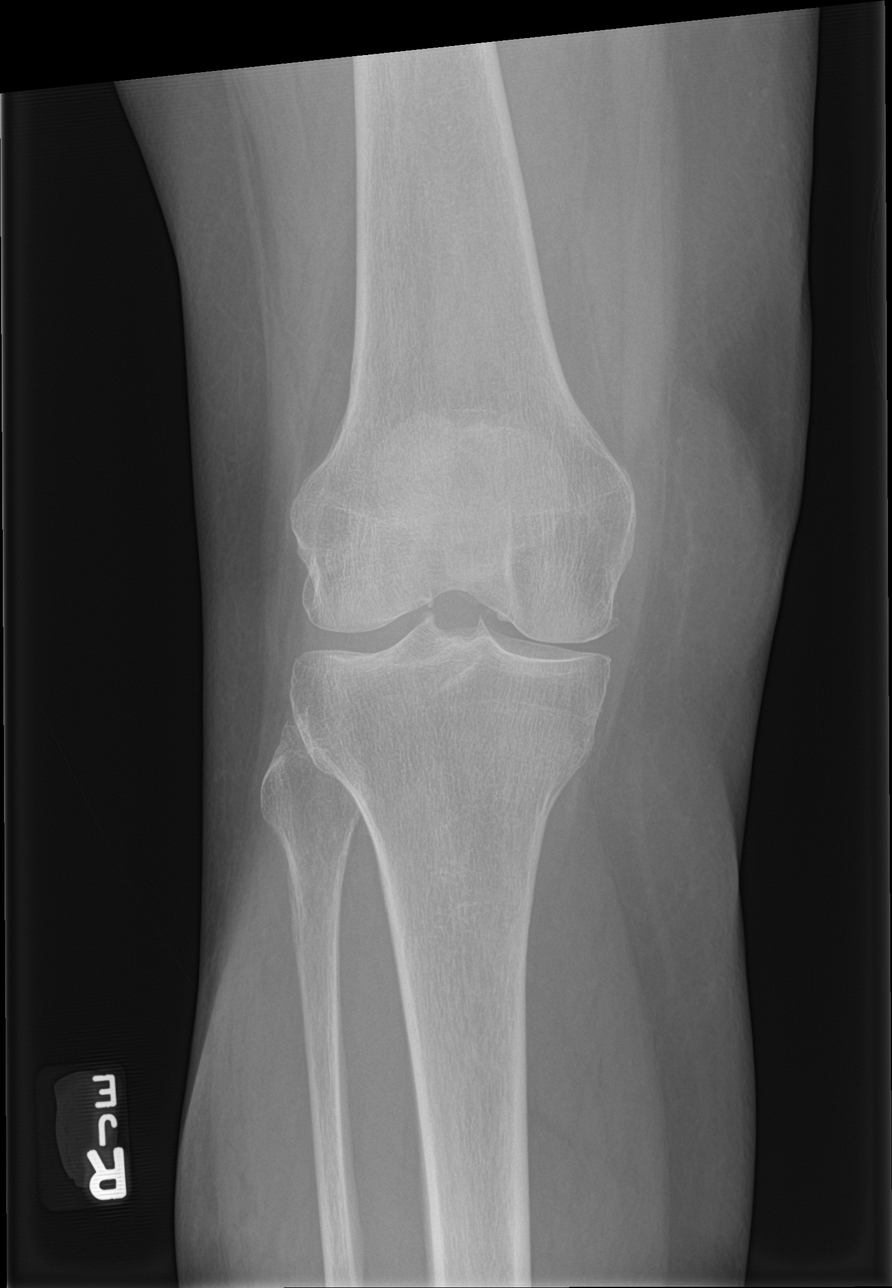

[knee lat]
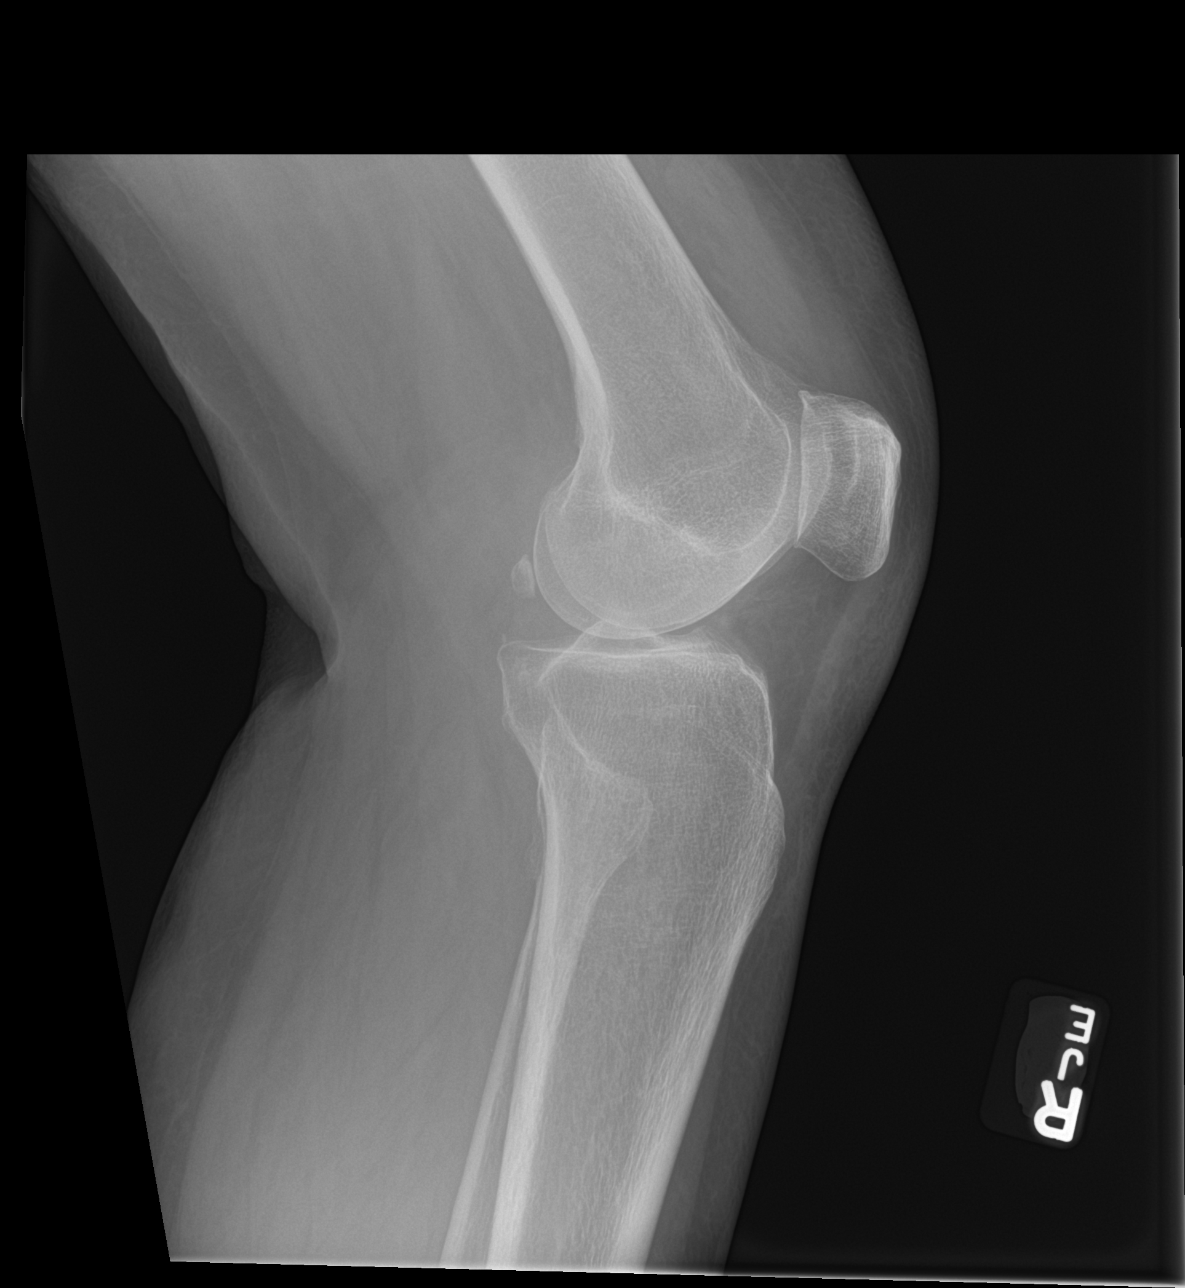

[knee obl (1 of 2)]
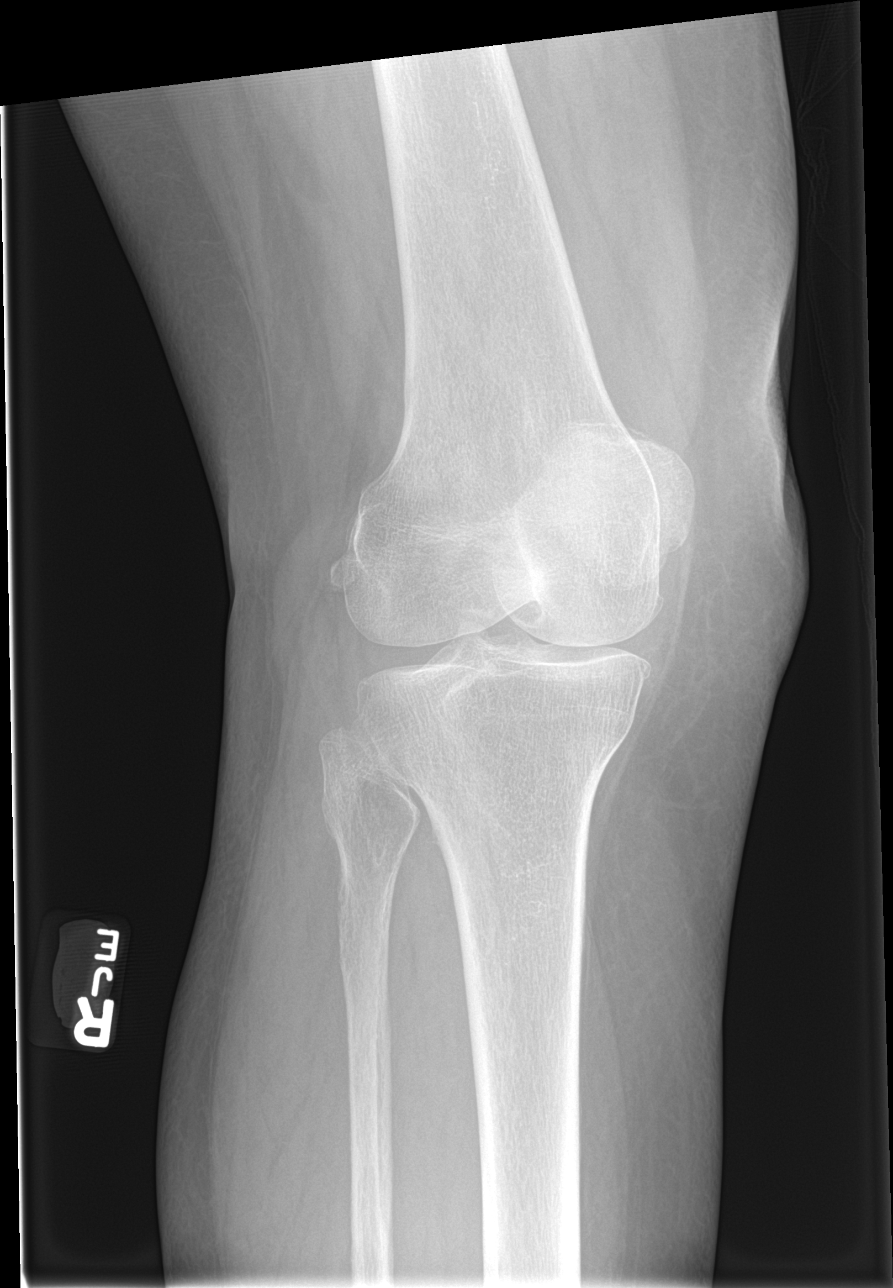

[knee obl (2 of 2)]
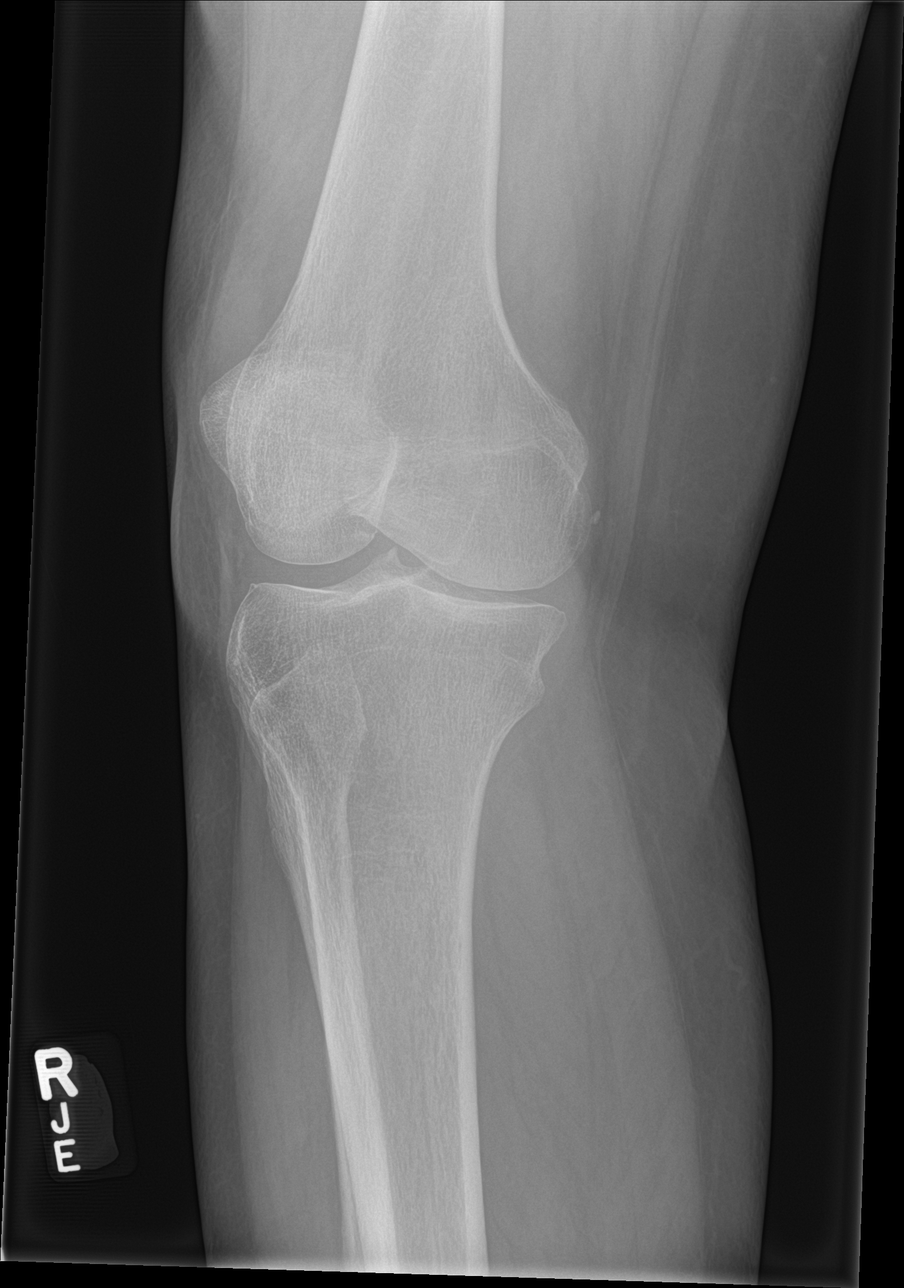

[4 of 4 positions shown; findings below may reference images not displayed]

FINDINGS: Early joint space narrowing in the patellofemoral compartment. Early
spurring. No acute bony abnormality. Specifically, no fracture,
subluxation, or dislocation. No joint effusion.
IMPRESSION: No acute bony abnormality.

## 2023-04-16 ENCOUNTER — Other Ambulatory Visit (HOSPITAL_BASED_OUTPATIENT_CLINIC_OR_DEPARTMENT_OTHER): Payer: Self-pay

## 2023-04-16 MED ORDER — COVID-19 MRNA VAC-TRIS(PFIZER) 30 MCG/0.3ML IM SUSY
0.3000 mL | PREFILLED_SYRINGE | Freq: Once | INTRAMUSCULAR | 0 refills | Status: AC
  Filled 2023-04-16: qty 0.3, 1d supply, fill #0

## 2023-06-14 ENCOUNTER — Other Ambulatory Visit: Payer: Self-pay | Admitting: Family

## 2023-06-18 DIAGNOSIS — M1712 Unilateral primary osteoarthritis, left knee: Secondary | ICD-10-CM | POA: Diagnosis not present

## 2023-06-24 ENCOUNTER — Ambulatory Visit: Payer: Medicare Other | Admitting: Family

## 2023-06-24 ENCOUNTER — Other Ambulatory Visit (HOSPITAL_BASED_OUTPATIENT_CLINIC_OR_DEPARTMENT_OTHER): Payer: Self-pay

## 2023-06-24 VITALS — BP 126/72 | HR 65 | Temp 98.5°F | Resp 16 | Ht 66.0 in | Wt 177.0 lb

## 2023-06-24 DIAGNOSIS — E785 Hyperlipidemia, unspecified: Secondary | ICD-10-CM | POA: Diagnosis not present

## 2023-06-24 DIAGNOSIS — R7303 Prediabetes: Secondary | ICD-10-CM | POA: Diagnosis not present

## 2023-06-24 DIAGNOSIS — R739 Hyperglycemia, unspecified: Secondary | ICD-10-CM | POA: Diagnosis not present

## 2023-06-24 DIAGNOSIS — M545 Low back pain, unspecified: Secondary | ICD-10-CM | POA: Diagnosis not present

## 2023-06-24 DIAGNOSIS — I10 Essential (primary) hypertension: Secondary | ICD-10-CM | POA: Diagnosis not present

## 2023-06-24 DIAGNOSIS — M549 Dorsalgia, unspecified: Secondary | ICD-10-CM | POA: Insufficient documentation

## 2023-06-24 LAB — LIPID PANEL
Cholesterol: 193 mg/dL (ref 0–200)
HDL: 84.7 mg/dL (ref >39.00)
LDL Cholesterol: 91 mg/dL (ref 0–99)
NonHDL: 108.08
Total CHOL/HDL Ratio: 2
Triglycerides: 83 mg/dL (ref 0.0–149.0)
VLDL: 16.6 mg/dL (ref 0.0–40.0)

## 2023-06-24 LAB — COMPREHENSIVE METABOLIC PANEL
ALT: 10 U/L (ref 0–35)
AST: 17 U/L (ref 0–37)
Albumin: 4.3 g/dL (ref 3.5–5.2)
Alkaline Phosphatase: 83 U/L (ref 39–117)
BUN: 21 mg/dL (ref 6–23)
CO2: 29 meq/L (ref 19–32)
Calcium: 9.9 mg/dL (ref 8.4–10.5)
Chloride: 99 meq/L (ref 96–112)
Creatinine, Ser: 0.92 mg/dL (ref 0.40–1.20)
GFR: 59.87 mL/min — ABNORMAL LOW (ref >60.00)
Glucose, Bld: 98 mg/dL (ref 70–99)
Potassium: 4.4 meq/L (ref 3.5–5.1)
Sodium: 138 meq/L (ref 135–145)
Total Bilirubin: 0.9 mg/dL (ref 0.2–1.2)
Total Protein: 7.1 g/dL (ref 6.0–8.3)

## 2023-06-24 LAB — HEMOGLOBIN A1C: Hgb A1c MFr Bld: 6.3 % (ref 4.6–6.5)

## 2023-06-24 MED ORDER — GABAPENTIN 300 MG PO CAPS
300.0000 mg | ORAL_CAPSULE | ORAL | 1 refills | Status: AC | PRN
  Filled 2023-06-24: qty 30, fill #0
  Filled 2023-06-24: qty 30, 30d supply, fill #0

## 2023-06-24 NOTE — Assessment & Plan Note (Signed)
 BP elevated in office but was normal this AM at home.Continue current doses of amlodipine and chlorthalidone.

## 2023-06-24 NOTE — Assessment & Plan Note (Signed)
 Intermittent. Finds good relief with use of prn gabapentin. Continue same.

## 2023-06-24 NOTE — Progress Notes (Signed)
 Subjective:     Patient ID: Patricia Lee, female    DOB: 09-27-1945, 78 y.o.   MRN: [id number]  Chief Complaint  Patient presents with   Hypertension    Here for follow up    HPI  Discussed the use of AI scribe software for clinical note transcription with the patient, who gave verbal consent to proceed.  History of Present Illness         Patient present today for follow up.   HTN- maintained on chlorthalidone  and amlodipine .   She has hx of white coat HTN and had a bp this morning prior to today's visit of 126/72.   BP Readings from Last 3 Encounters:  06/24/23 126/72  02/25/23 139/69  01/15/23 (!) 136/56    Hyperlipidemia- maintained on atorvastatin .   Lab Results  Component Value Date   CHOL 184 08/20/2022   HDL 87.10 08/20/2022   LDLCALC 79 08/20/2022   LDLDIRECT 136.2 07/21/2012   TRIG 89.0 08/20/2022   CHOLHDL 2 08/20/2022    Hyperglycemia- Lab Results  Component Value Date   HGBA1C 6.3 01/15/2023   HGBA1C 6.2 08/20/2022   HGBA1C 6.2 02/19/2022   Lab Results  Component Value Date   MICROALBUR <0.7 08/08/2020   LDLCALC 79 08/20/2022   CREATININE 0.89 01/28/2023    Health Maintenance Due  Topic Date Due   Zoster Vaccines- Shingrix  (1 of 2) 07/11/1995    Past Medical History:  Diagnosis Date   Allergy    spring seasonal   Arthritis    hip    Borderline diabetes 12/06/2016   Hyperlipidemia 10/21/2016   Hypertension    Preventative health care 02/19/2022    Past Surgical History:  Procedure Laterality Date   COLONOSCOPY     JOINT REPLACEMENT  Right hip   POLYPECTOMY     TOTAL HIP ARTHROPLASTY Right 04/09/2016   Procedure: RIGHT TOTAL HIP ARTHROPLASTY ANTERIOR APPROACH;  Surgeon: Donnice Car, MD;  Location: [facility];  Service: Orthopedics;  Laterality: Right;    Family History  Problem Relation Age of Onset   Stomach cancer Mother        died at 60   Arthritis Mother    Cancer Mother    Colon cancer Father        died at 75    Cancer Father    Heart failure Maternal Grandmother    Cancer Paternal Grandmother    Colon polyps Neg Hx    Rectal cancer Neg Hx    Esophageal cancer Neg Hx    Pancreatic cancer Neg Hx    Liver disease Neg Hx     Social History   Socioeconomic History   Marital status: Widowed    Spouse name: Not on file   Number of children: Not on file   Years of education: Not on file   Highest education level: Associate degree: occupational, scientist, product/process development, or vocational program  Occupational History   Occupation: retired  Tobacco Use   Smoking status: Former    Current packs/day: 0.00    Average packs/day: 0.3 packs/day for 20.0 years (5.0 ttl pk-yrs)    Types: Cigarettes    Start date: 06/10/1973    Quit date: 06/10/1993    Years since quitting: 30.0   Smokeless tobacco: Never  Substance and Sexual Activity   Alcohol use: Yes    Alcohol/week: 5.0 standard drinks of alcohol    Types: 5 Glasses of wine per week    Comment: wine, 1-2 glasses every  other day   Drug use: No   Sexual activity: Not Currently    Birth control/protection: Post-menopausal  Other Topics Concern   Not on file  Social History Narrative   2 sons, one in [place] and one in [place].    Retired from [employer]   Enjoys going to the Y 3 times a week   Active in her church   Husband died in 07-Jun-2013   Social Drivers of Health   Financial Resource Strain: Low Risk  (06/17/2023)   Overall Financial Resource Strain (CARDIA)    Difficulty of Paying Living Expenses: Not hard at all  Food Insecurity: No Food Insecurity (06/17/2023)   Hunger Vital Sign    Worried About Running Out of Food in the Last Year: Never true    Ran Out of Food in the Last Year: Never true  Transportation Needs: No Transportation Needs (06/17/2023)   PRAPARE - Administrator, Civil Service (Medical): No    Lack of Transportation (Non-Medical): No  Physical Activity: Sufficiently Active (06/17/2023)   Exercise Vital Sign     Days of Exercise per Week: 4 days    Minutes of Exercise per Session: 40 min  Stress: No Stress Concern Present (06/17/2023)   [employer] of Occupational Health - Occupational Stress Questionnaire    Feeling of Stress : Only a little  Social Connections: Moderately Integrated (06/17/2023)   Social Connection and Isolation Panel [NHANES]    Frequency of Communication with Friends and Family: More than three times a week    Frequency of Social Gatherings with Friends and Family: More than three times a week    Attends Religious Services: More than 4 times per year    Active Member of [employer] or Organizations: Yes    Attends Banker Meetings: More than 4 times per year    Marital Status: Widowed  Intimate Partner Violence: Not At Risk (10/07/2022)   Humiliation, Afraid, Rape, and Kick questionnaire    Fear of Current or Ex-Partner: No    Emotionally Abused: No    Physically Abused: No    Sexually Abused: No    Outpatient Medications Prior to Visit  Medication Sig Dispense Refill   amLODipine  (NORVASC ) 2.5 MG tablet Take 1 tablet (2.5 mg total) by mouth 2 (two) times daily. 200 tablet 1   atorvastatin  (LIPITOR) 10 MG tablet TAKE 1 TABLET BY MOUTH DAILY 100 tablet 1   betamethasone  dipropionate 0.05 % cream Apply topically 2 (two) times daily. (Patient taking differently: Apply 1 Application topically as needed (flare up).) 30 g 0   chlorthalidone  (HYGROTON ) 25 MG tablet TAKE 1 TABLET BY MOUTH DAILY 100 tablet 2   Cholecalciferol (VITAMIN D) 50 MCG (2000 UT) tablet Take 2,000 Units by mouth daily.     Multiple Vitamins-Minerals (MULTIVITAMIN WITH MINERALS) tablet Take 1 tablet by mouth daily.     Omega-3 Fatty Acids ([employer]S FISH OIL) 1200 MG CAPS Take 1,200 mg by mouth daily.     polycarbophil (FIBERCON) 625 MG tablet Take 625 mg by mouth daily.     Turmeric 500 MG CAPS Take 500 mg by mouth daily.     gabapentin  (NEURONTIN ) 300 MG capsule Take 300 mg by mouth as needed (back  pain).     ofloxacin  (OCUFLOX ) 0.3 % ophthalmic solution Place 1 drop into both eyes every 6 (six) hours. 10 mL 0   No facility-administered medications prior to visit.    Allergies  Allergen Reactions  Codeine Other (See Comments)    Tachycardia and insomnia   Erythromycin Other (See Comments)    Tachycardia and insomnia   Influenza Vaccines Other (See Comments)    Nerve reaction similar to shingles- all nerves in back on fire    Prednisone Other (See Comments)    Tachycardia and insomnia   Tetracyclines & Related Other (See Comments)    Tachycardia/insomnia    ROS    See HPI Objective:    Physical Exam Constitutional:      Appearance: She is well-developed.  Cardiovascular:     Rate and Rhythm: Normal rate and regular rhythm.     Heart sounds: Normal heart sounds. No murmur heard. Pulmonary:     Effort: Pulmonary effort is normal. No respiratory distress.     Breath sounds: Normal breath sounds. No wheezing.  Skin:    General: Skin is warm and dry.  Psychiatric:        Behavior: Behavior normal.        Thought Content: Thought content normal.        Judgment: Judgment normal.      BP 126/72   Pulse 65   Temp 98.5 F (36.9 C) (Oral)   Resp 16   Ht 5' 6 (1.676 m)   Wt 177 lb (80.3 kg)   SpO2 97%   BMI 28.57 kg/m  Wt Readings from Last 3 Encounters:  06/24/23 177 lb (80.3 kg)  02/25/23 178 lb (80.7 kg)  01/15/23 180 lb (81.6 kg)       Assessment & Plan:   Problem List Items Addressed This Visit       Unprioritized   Hyperlipidemia   Tolerating atorvastatin  10mg .  Will update lipid panel.       Relevant Orders   Lipid panel   Comp Met (CMET)   Hyperglycemia   Update A1C.  Continue diet/exercise efforts.       Essential hypertension   BP elevated in office but was normal this AM at home.Continue current doses of amlodipine  and chlorthalidone .       Borderline diabetes - Primary   Relevant Orders   HgB A1c   Back pain   Intermittent.  Finds good relief with use of prn gabapentin . Continue same.      Recommended that she get covid booster at her pharmacy.  I have discontinued Patricia CROME. Klar's ofloxacin . I have also changed her gabapentin . Additionally, I am having her maintain her multivitamin with minerals, [facility] Fish Oil, Turmeric, Vitamin D, polycarbophil, betamethasone  dipropionate, amLODipine , chlorthalidone , and atorvastatin .  Meds ordered this encounter  Medications   gabapentin  (NEURONTIN ) 300 MG capsule    Sig: Take 1 capsule (300 mg total) by mouth once daily as needed (back pain).    Dispense:  30 capsule    Refill:  1    per secure chat w/M O'sullivan, ONCE DAILY as needed...    Supervising Provider:   DOMENICA BLACKBIRD A [phone]

## 2023-06-24 NOTE — Assessment & Plan Note (Signed)
 Tolerating atorvastatin 10mg .  Will update lipid panel.

## 2023-06-24 NOTE — Patient Instructions (Signed)
 Please continue your current medications

## 2023-06-24 NOTE — Assessment & Plan Note (Signed)
 Update A1C.  Continue diet/exercise efforts.

## 2023-07-31 ENCOUNTER — Other Ambulatory Visit: Payer: Self-pay | Admitting: Family

## 2023-08-15 ENCOUNTER — Ambulatory Visit (HOSPITAL_BASED_OUTPATIENT_CLINIC_OR_DEPARTMENT_OTHER)
Admission: RE | Admit: 2023-08-15 | Discharge: 2023-08-15 | Disposition: A | Discharge status: Home / Self Care | Source: Ambulatory Visit | Attending: Family | Admitting: Family

## 2023-08-15 ENCOUNTER — Ambulatory Visit: Payer: Medicare Other | Admitting: Family

## 2023-08-15 VITALS — BP 124/55 | HR 70 | Temp 98.5°F | Resp 16 | Ht 67.0 in | Wt 173.0 lb

## 2023-08-15 DIAGNOSIS — I700 Atherosclerosis of aorta: Secondary | ICD-10-CM | POA: Diagnosis not present

## 2023-08-15 DIAGNOSIS — Z01818 Encounter for other preprocedural examination: Secondary | ICD-10-CM | POA: Diagnosis not present

## 2023-08-15 DIAGNOSIS — R7303 Prediabetes: Secondary | ICD-10-CM

## 2023-08-15 DIAGNOSIS — I10 Essential (primary) hypertension: Secondary | ICD-10-CM

## 2023-08-15 DIAGNOSIS — E785 Hyperlipidemia, unspecified: Secondary | ICD-10-CM | POA: Diagnosis not present

## 2023-08-15 LAB — CBC WITH DIFFERENTIAL/PLATELET
Basophils Absolute: 0.1 10*3/uL (ref 0.0–0.1)
Basophils Relative: 0.7 % (ref 0.0–3.0)
Eosinophils Absolute: 0.1 10*3/uL (ref 0.0–0.7)
Eosinophils Relative: 2 % (ref 0.0–5.0)
HCT: 41.1 % (ref 36.0–46.0)
Hemoglobin: 13.5 g/dL (ref 12.0–15.0)
Lymphocytes Relative: 17.3 % (ref 12.0–46.0)
Lymphs Abs: 1.2 10*3/uL (ref 0.7–4.0)
MCHC: 32.9 g/dL (ref 30.0–36.0)
MCV: 93.4 fl (ref 78.0–100.0)
Monocytes Absolute: 0.7 10*3/uL (ref 0.1–1.0)
Monocytes Relative: 9.7 % (ref 3.0–12.0)
Neutro Abs: 4.9 10*3/uL (ref 1.4–7.7)
Neutrophils Relative %: 70.3 % (ref 43.0–77.0)
Platelets: 298 10*3/uL (ref 150.0–400.0)
RBC: 4.4 Mil/uL (ref 3.87–5.11)
RDW: 14 % (ref 11.5–15.5)
WBC: 7 10*3/uL (ref 4.0–10.5)

## 2023-08-15 LAB — COMPREHENSIVE METABOLIC PANEL
ALT: 11 U/L (ref 0–35)
AST: 16 U/L (ref 0–37)
Albumin: 4.4 g/dL (ref 3.5–5.2)
Alkaline Phosphatase: 84 U/L (ref 39–117)
BUN: 23 mg/dL (ref 6–23)
CO2: 29 meq/L (ref 19–32)
Calcium: 10.1 mg/dL (ref 8.4–10.5)
Chloride: 101 meq/L (ref 96–112)
Creatinine, Ser: 0.92 mg/dL (ref 0.40–1.20)
GFR: 59.81 mL/min — ABNORMAL LOW (ref >60.00)
Glucose, Bld: 95 mg/dL (ref 70–99)
Potassium: 3.9 meq/L (ref 3.5–5.1)
Sodium: 140 meq/L (ref 135–145)
Total Bilirubin: 0.9 mg/dL (ref 0.2–1.2)
Total Protein: 7.4 g/dL (ref 6.0–8.3)

## 2023-08-15 LAB — PROTIME-INR
INR: 1.1 ratio — ABNORMAL HIGH (ref 0.8–1.0)
Prothrombin Time: 11.5 s (ref 9.6–13.1)

## 2023-08-15 LAB — APTT: aPTT: 30 s (ref 25.4–36.8)

## 2023-08-15 NOTE — Progress Notes (Addendum)
 Subjective:     Patient ID: Patricia Lee, female    DOB: 02-02-1946, 78 y.o.   MRN: [id number]  Chief Complaint  Patient presents with   Pre-op Exam    Here for visit prior to left knee replacement on 09/23/23.     HPI  Discussed the use of AI scribe software for clinical note transcription with the patient, who gave verbal consent to proceed.  History of Present Illness - The patient presents for preoperative clearance for a left knee replacement.  She is scheduled for a left knee replacement in April due to significant stiffness and pain in the left knee, described as 'bone on bone,' which has been present for two years. The pain limits her ability to walk long distances and necessitates the use of a cane due to stiffness upon standing, although it loosens up after some time. She has a history of a previous hip replacement about seven or eight years ago, which she describes as a 'breeze.'  Her current medications include amlodipine 2.5 mg and chlorthalidone for blood pressure management. She occasionally uses gabapentin when her back 'goes out.' She is not on aspirin.  No current cold symptoms, hearing or vision concerns, skin rashes, chest pain, shortness of breath, digestive issues, or urinary symptoms. She experiences joint pain primarily in the left knee and reports feeling anxious, which she attributes to the upcoming surgery.     Health Maintenance Due  Topic Date Due   Zoster Vaccines- Shingrix (1 of 2) 07/11/1995   Medicare Annual Wellness (AWV)  10/07/2023    Past Medical History:  Diagnosis Date   Allergy    spring seasonal   Arthritis    hip    Borderline diabetes 12/06/2016   Hyperlipidemia 10/21/2016   Hypertension    Preventative health care 02/19/2022    Past Surgical History:  Procedure Laterality Date   COLONOSCOPY     JOINT REPLACEMENT  Right hip   POLYPECTOMY     TOTAL HIP ARTHROPLASTY Right 04/09/2016   Procedure: RIGHT TOTAL HIP ARTHROPLASTY  ANTERIOR APPROACH;  Surgeon: Durene Romans, MD;  Location: [facility];  Service: Orthopedics;  Laterality: Right;    Family History  Problem Relation Age of Onset   Stomach cancer Mother        died at 6   Arthritis Mother    Cancer Mother    Colon cancer Father        died at 70   Cancer Father    Heart failure Maternal Grandmother    Cancer Paternal Grandmother    Colon polyps Neg Hx    Rectal cancer Neg Hx    Esophageal cancer Neg Hx    Pancreatic cancer Neg Hx    Liver disease Neg Hx     Social History   Socioeconomic History   Marital status: Widowed    Spouse name: Not on file   Number of children: Not on file   Years of education: Not on file   Highest education level: Associate degree: occupational, Scientist, product/process development, or vocational program  Occupational History   Occupation: retired  Tobacco Use   Smoking status: Former    Current packs/day: 0.00    Average packs/day: 0.3 packs/day for 20.0 years (5.0 ttl pk-yrs)    Types: Cigarettes    Start date: 06/10/1973    Quit date: 06/10/1993    Years since quitting: 30.2   Smokeless tobacco: Never  Substance and Sexual Activity   Alcohol use: Yes  Alcohol/week: 5.0 standard drinks of alcohol    Types: 5 Glasses of wine per week    Comment: wine, 1-2 glasses every other day   Drug use: No   Sexual activity: Not Currently    Birth control/protection: Post-menopausal  Other Topics Concern   Not on file  Social History Narrative   2 sons, one in [place] and one in [place].    Retired from [employer]   [facility]jo[facility] to the [facility] 3 times a [facility]   Active in her church   Husband died in September 10, 2012   Social Drivers of Health   Financial Resource Strain: Low Risk  (06/17/2023)   Overall Financial Resource Strain (CARDIA)    Difficulty of Paying Living Expenses: Not hard at all  Food Insecurity: No Food Insecurity (06/17/2023)   Hunger Vital Sign    Worried About Running Out of Food in the Last Year: Never true    Ran Out  of Food in the Last Year: Never true  Transportation Needs: No Transportation Needs (06/17/2023)   PRAPARE - Administrator, Civil Service (Medical): No    Lack of Transportation (Non-Medical): No  Physical Activity: Sufficiently Active (06/17/2023)   Exercise Vital Sign    Days of Exercise per Week: 4 days    Minutes of Exercise per Session: 40 min  Stress: No Stress Concern Present (06/17/2023)   [employer] of Occupational Health - Occupational Stress Questionnaire    Feeling of Stress : Only a little  Social Connections: Moderately Integrated (06/17/2023)   Social Connection and Isolation Panel [NHANES]    Frequency of Communication with Friends and Family: More than three times a week    Frequency of Social Gatherings with Friends and Family: More than three times a week    Attends Religious Services: More than 4 times per year    Active Member of [employer] or Organizations: Yes    Attends Banker Meetings: More than 4 times per year    Marital Status: Widowed  Intimate Partner Violence: Not At Risk (10/07/2022)   Humiliation, Afraid, Rape, and Kick questionnaire    Fear of Current or Ex-Partner: No    Emotionally Abused: No    Physically Abused: No    Sexually Abused: No    Outpatient Medications Prior to Visit  Medication Sig Dispense Refill   amLODipine (NORVASC) 2.5 MG tablet Take 1 tablet (2.5 mg total) by mouth 2 (two) times daily. 90 tablet 0   atorvastatin (LIPITOR) 10 MG tablet TAKE 1 TABLET BY MOUTH DAILY 100 tablet 1   betamethasone dipropionate 0.05 % cream Apply topically 2 (two) times daily. (Patient taking differently: Apply 1 Application topically as needed (flare up).) 30 g 0   Biotin 1 MG CAPS Take by mouth.     chlorthalidone (HYGROTON) 25 MG tablet TAKE 1 TABLET BY MOUTH DAILY 100 tablet 2   Cholecalciferol (VITAMIN D) 50 MCG (2000 UT) tablet Take 2,000 Units by mouth daily.     gabapentin (NEURONTIN) 300 MG capsule Take 1 capsule (300 mg  total) by mouth once daily as needed (back pain). 30 capsule 1   Multiple Vitamins-Minerals (MULTIVITAMIN WITH MINERALS) tablet Take 1 tablet by mouth daily.     Omega-3 Fatty Acids ([facility] FISH OIL) 1200 MG CAPS Take 1,200 mg by mouth daily.     polycarbophil (FIBERCON) 625 MG tablet Take 625 mg by mouth daily.     Turmeric 500 MG CAPS Take 500 mg by  mouth daily.     No facility-administered medications prior to visit.    Allergies  Allergen Reactions   Codeine Other (See Comments)    Tachycardia and insomnia   Erythromycin Other (See Comments)    Tachycardia and insomnia   Influenza Vaccines Other (See Comments)    Nerve reaction similar to shingles- all nerves in back on fire    Prednisone Other (See Comments)    Tachycardia and insomnia   Tetracyclines & Related Other (See Comments)    Tachycardia/insomnia    Review of Systems  Constitutional:  Negative for weight loss.  HENT:  Negative for congestion and hearing loss.   Eyes:  Negative for blurred vision.  Respiratory:  Negative for cough and shortness of breath.   Cardiovascular:  Negative for chest pain.  Gastrointestinal:  Negative for constipation and diarrhea.  Genitourinary:  Negative for frequency, hematuria and urgency.  Musculoskeletal:  Positive for joint pain (left knee). Negative for myalgias.  Skin:  Negative for rash.  Neurological:  Negative for headaches.  Endo/Heme/Allergies:  Positive for environmental allergies.  Psychiatric/Behavioral:  Negative for depression.        Anxious about surgery       Objective:    Physical Exam Constitutional:      General: She is not in acute distress.    Appearance: Normal appearance. She is well-developed.  HENT:     Head: Normocephalic and atraumatic.     Right Ear: Tympanic membrane, ear canal and external ear normal.     Left Ear: Tympanic membrane, ear canal and external ear normal.  Eyes:     General: No scleral icterus. Neck:     Thyroid: No thyromegaly.   Cardiovascular:     Rate and Rhythm: Normal rate and regular rhythm.     Heart sounds: Normal heart sounds. No murmur heard. Pulmonary:     Effort: Pulmonary effort is normal. No respiratory distress.     Breath sounds: Normal breath sounds. No wheezing.  Musculoskeletal:     Cervical back: Neck supple.     Comments: Mild left knee swelling  Skin:    General: Skin is warm and dry.  Neurological:     Mental Status: She is alert and oriented to person, place, and time.  Psychiatric:        Mood and Affect: Mood normal.        Behavior: Behavior normal.        Thought Content: Thought content normal.        Judgment: Judgment normal.      BP (!) 124/55 (BP Location: Right Arm, Patient Position: Sitting, Cuff Size: Normal)   Pulse 70   Temp 98.5 F (36.9 C) (Oral)   Resp 16   Ht 5\' 7"  (1.702 m)   Wt 173 lb (78.5 kg)   SpO2 99%   BMI 27.10 kg/m  Wt Readings from Last 3 Encounters:  08/15/23 173 lb (78.5 kg)  06/24/23 177 lb (80.3 kg)  02/25/23 178 lb (80.7 kg)       Assessment & Plan:   Problem List Items Addressed This Visit       Unprioritized   Pre-op evaluation - Primary   EKG tracing is personally reviewed.  EKG notes NSR.  No acute changes.  Obtain, CXR, labs, formal clearance pending review of these studies.       Relevant Orders   EKG 12-Lead (Completed)   CBC w/Diff   Protime-INR   Urine Culture  Comp Met (CMET)   PTT   DG Chest 2 View   Hyperlipidemia   Lab Results  Component Value Date   CHOL 193 06/24/2023   HDL 84.70 06/24/2023   LDLCALC 91 06/24/2023   LDLDIRECT 136.2 07/21/2012   TRIG 83.0 06/24/2023   CHOLHDL 2 06/24/2023   Stable on atorvastatin, coninue same.       Essential hypertension   At goal, continue chlorthalidone and amlodipine.       Borderline diabetes   Lab Results  Component Value Date   HGBA1C 6.3 06/24/2023   Stable sugar, continue DM diet.        Addendum:  I have reviewed x-ray, EKG and lab work.   No medical contraindication for planned Left total Knee replacement at this time.   I am having Suzzette L. Toves maintain her multivitamin with minerals, [facility] Fish Oil, Turmeric, Vitamin D, polycarbophil, betamethasone dipropionate, chlorthalidone, atorvastatin, gabapentin, amLODipine, and Biotin.  No orders of the defined types were placed in this encounter.

## 2023-08-15 NOTE — Assessment & Plan Note (Signed)
 EKG tracing is personally reviewed.  EKG notes NSR.  No acute changes.  Obtain, CXR, labs, formal clearance pending review of these studies.

## 2023-08-15 NOTE — Assessment & Plan Note (Signed)
 Lab Results  Component Value Date   CHOL 193 06/24/2023   HDL 84.70 06/24/2023   LDLCALC 91 06/24/2023   LDLDIRECT 136.2 07/21/2012   TRIG 83.0 06/24/2023   CHOLHDL 2 06/24/2023   Stable on atorvastatin, coninue same.

## 2023-08-15 NOTE — Assessment & Plan Note (Signed)
 At goal, continue chlorthalidone and amlodipine.

## 2023-08-15 NOTE — Assessment & Plan Note (Signed)
 Lab Results  Component Value Date   HGBA1C 6.3 06/24/2023   Stable sugar, continue DM diet.

## 2023-08-15 NOTE — Patient Instructions (Signed)
 VISIT SUMMARY:  You came in today for a preoperative clearance for your upcoming left knee replacement surgery. You have been experiencing significant stiffness and pain in your left knee for the past two years, which has limited your ability to walk long distances and requires the use of a cane. Your blood pressure, cholesterol, and diabetes are all well controlled with your current medications. You are feeling anxious about the surgery, which is understandable.  YOUR PLAN:  -LEFT KNEE OSTEOARTHRITIS: Osteoarthritis is a condition where the cartilage in the joints wears down over time, leading to pain and stiffness. You are scheduled for a left knee replacement in April. Continue your current management until surgery and stop taking fish oil one week before the surgery as advised by your surgeon.  -HYPERTENSION: Hypertension, or high blood pressure, is well controlled with your current medications, Amlodipine 2.5mg  and Chlorthalidone. Continue taking these medications as prescribed.  -HYPERLIPIDEMIA: Hyperlipidemia is a condition where there are high levels of fats in the blood. Your condition is well controlled with your current regimen. Continue with your current management.  -DIABETES MELLITUS: Diabetes Mellitus is a condition that affects how your body processes blood sugar. Your last A1c in January was 6.3, indicating good control. Continue with your current management and recheck your A1c in three months.  -PREOPERATIVE EVALUATION: Before your surgery, we need to ensure you are in good health. We will order blood tests, a urine culture, and a chest x-ray. Continue your current medications until surgery, except for fish oil, which you should stop one week before surgery. We will send over your clearance a week from Monday.  INSTRUCTIONS:  Please follow up with your physical scheduled for March 28th.

## 2023-08-16 ENCOUNTER — Encounter: Payer: Self-pay | Admitting: Family

## 2023-08-16 LAB — URINE CULTURE
MICRO NUMBER:: 16173529
SPECIMEN QUALITY:: ADEQUATE

## 2023-09-03 ENCOUNTER — Telehealth: Payer: Self-pay | Admitting: Family

## 2023-09-03 NOTE — Telephone Encounter (Signed)
 Copied from CRM [id number]. Topic: Medicare AWV >> Sep 03, 2023  2:12 PM Payton Doughty wrote: Reason for CRM: Called LVM 09/03/2023 to schedule AWV. Please schedule Virtual or Telehealth visits ONLY.   Verlee Rossetti; Care Guide Ambulatory Clinical Support [place] [facility] Direct Dial: [phone]

## 2023-09-04 NOTE — Progress Notes (Signed)
 Surgery orders requested via Epic inbox.

## 2023-09-05 DIAGNOSIS — M25562 Pain in left knee: Secondary | ICD-10-CM | POA: Diagnosis not present

## 2023-09-10 NOTE — Patient Instructions (Signed)
 SURGICAL WAITING ROOM VISITATION  Patients having surgery or a procedure may have no more than 2 support people in the waiting area - these visitors may rotate.    Children under the age of 104 must have an adult with them who is not the patient.  Due to an increase in RSV and influenza rates and associated hospitalizations, children ages 61 and under may not visit patients in [facility] hospitals.  Visitors with respiratory illnesses are discouraged from visiting and should remain at home.  If the patient needs to stay at the hospital during part of their recovery, the visitor guidelines for inpatient rooms apply. Pre-op nurse will coordinate an appropriate time for 1 support person to accompany patient in pre-op.  This support person may not rotate.    Please refer to the [facility] website for the visitor guidelines for Inpatients (after your surgery is over and you are in a regular room).    Your procedure is scheduled on: 09/23/23   Report to [facility] Main Entrance    Report to admitting at 5:15 AM   Call this number if you have problems the morning of surgery [phone]   Do not eat food :After Midnight.   After Midnight you may have the following liquids until 4:15 AM DAY OF SURGERY  Water Non-Citrus Juices (without pulp, NO RED-Apple, White grape, White cranberry) Black Coffee (NO MILK/CREAM OR CREAMERS, sugar ok)  Clear Tea (NO MILK/CREAM OR CREAMERS, sugar ok) regular and decaf                             Plain Jell-O (NO RED)                                           Fruit ices (not with fruit pulp, NO RED)                                     Popsicles (NO RED)                                                               Sports drinks like Gatorade (NO RED)                 The day of surgery:  Drink ONE (1) Pre-Surgery G2 at 4:15 AM the morning of surgery. Drink in one sitting. Do not sip.  This drink was given to you during your hospital  pre-op  appointment visit. Nothing else to drink after completing the  Pre-Surgery G2.          If you have questions, please contact your surgeon's office.   FOLLOW BOWEL PREP AND ANY ADDITIONAL PRE OP INSTRUCTIONS YOU RECEIVED FROM YOUR SURGEON'S OFFICE!!!     Oral Hygiene is also important to reduce your risk of infection.                                    Remember - BRUSH YOUR  TEETH THE MORNING OF SURGERY WITH YOUR REGULAR TOOTHPASTE  DENTURES WILL BE REMOVED PRIOR TO SURGERY PLEASE DO NOT APPLY "Poly grip" OR ADHESIVES!!!   Stop all vitamins and herbal supplements 7 days before surgery.   Take these medicines the morning of surgery with A SIP OF WATER: Amlodipine, Atorvastatin, Gabapentin                               You may not have any metal on your body including hair pins, jewelry, and body piercing             Do not wear make-up, lotions, powders, perfumes or deodorant  Do not wear nail polish including gel and S&S, artificial/acrylic nails, or any other type of covering on natural nails including finger and toenails. If you have artificial nails, gel coating, etc. that needs to be removed by a nail salon please have this removed prior to surgery or surgery may need to be canceled/ delayed if the surgeon/ anesthesia feels like they are unable to be safely monitored.   Do not shave  48 hours prior to surgery.    Do not bring valuables to the hospital. [facility] IS NOT             RESPONSIBLE   FOR VALUABLES.   Contacts, glasses, dentures or bridgework may not be worn into surgery.   Bring small overnight bag day of surgery.   DO NOT BRING YOUR HOME MEDICATIONS TO THE HOSPITAL. PHARMACY WILL DISPENSE MEDICATIONS LISTED ON YOUR MEDICATION LIST TO YOU DURING YOUR ADMISSION IN THE HOSPITAL!   Special Instructions: Bring a copy of your healthcare power of attorney and living will documents the day of surgery if you haven't scanned them before.              Please read over the  following fact sheets you were given: IF YOU HAVE QUESTIONS ABOUT YOUR PRE-OP INSTRUCTIONS PLEASE CALL [name]    If you received a COVID test during your pre-op visit  it is requested that you wear a mask when out in public, stay away from anyone that may not be feeling well and notify your surgeon if you develop symptoms. If you test positive for Covid or have been in contact with anyone that has tested positive in the last 10 days please notify you surgeon.      Pre-operative 5 CHG Bath Instructions   You can play a key role in reducing the risk of infection after surgery. Your skin needs to be as free of germs as possible. You can reduce the number of germs on your skin by washing with CHG (chlorhexidine gluconate) soap before surgery. CHG is an antiseptic soap that kills germs and continues to kill germs even after washing.   DO NOT use if you have an allergy to chlorhexidine/CHG or antibacterial soaps. If your skin becomes reddened or irritated, stop using the CHG and notify one of our RNs at [phone].   Please shower with the CHG soap starting 4 days before surgery using the following schedule:     Please keep in mind the following:  DO NOT shave, including legs and underarms, starting the day of your first shower.   You may shave your face at any point before/day of surgery.  Place clean sheets on your bed the day you start using CHG soap. Use a clean washcloth (not used since being washed)  for each shower. DO NOT sleep with pets once you start using the CHG.   CHG Shower Instructions:  If you choose to wash your hair and private area, wash first with your normal shampoo/soap.  After you use shampoo/soap, rinse your hair and body thoroughly to remove shampoo/soap residue.  Turn the water OFF and apply about 3 tablespoons (45 ml) of CHG soap to a CLEAN washcloth.  Apply CHG soap ONLY FROM YOUR NECK DOWN TO YOUR TOES (washing for 3-5 minutes)  DO NOT use CHG soap  on face, private areas, open wounds, or sores.  Pay special attention to the area where your surgery is being performed.  If you are having back surgery, having someone wash your back for you may be helpful. Wait 2 minutes after CHG soap is applied, then you may rinse off the CHG soap.  Pat dry with a clean towel  Put on clean clothes/pajamas   If you choose to wear lotion, please use ONLY the CHG-compatible lotions on the back of this paper.     Additional instructions for the day of surgery: DO NOT APPLY any lotions, deodorants, cologne, or perfumes.   Put on clean/comfortable clothes.  Brush your teeth.  Ask your nurse before applying any prescription medications to the skin.      CHG Compatible Lotions   Aveeno Moisturizing lotion  Cetaphil Moisturizing Cream  Cetaphil Moisturizing Lotion  Clairol Herbal Essence Moisturizing Lotion, Dry Skin  Clairol Herbal Essence Moisturizing Lotion, Extra Dry Skin  Clairol Herbal Essence Moisturizing Lotion, Normal Skin  Curel Age Defying Therapeutic Moisturizing Lotion with Alpha Hydroxy  Curel Extreme Care Body Lotion  Curel Soothing Hands Moisturizing Hand Lotion  Curel Therapeutic Moisturizing Cream, Fragrance-Free  Curel Therapeutic Moisturizing Lotion, Fragrance-Free  Curel Therapeutic Moisturizing Lotion, Original Formula  Eucerin Daily Replenishing Lotion  Eucerin Dry Skin Therapy Plus Alpha Hydroxy Crme  Eucerin Dry Skin Therapy Plus Alpha Hydroxy Lotion  Eucerin Original Crme  Eucerin Original Lotion  Eucerin Plus Crme Eucerin Plus Lotion  Eucerin TriLipid Replenishing Lotion  Keri Anti-Bacterial Hand Lotion  Keri Deep Conditioning Original Lotion Dry Skin Formula Softly Scented  Keri Deep Conditioning Original Lotion, Fragrance Free Sensitive Skin Formula  Keri Lotion Fast Absorbing Fragrance Free Sensitive Skin Formula  Keri Lotion Fast Absorbing Softly Scented Dry Skin Formula  Keri Original Lotion  Keri Skin  Renewal Lotion Keri Silky Smooth Lotion  Keri Silky Smooth Sensitive Skin Lotion  Nivea Body Creamy Conditioning Oil  Nivea Body Extra Enriched Lotion  Nivea Body Original Lotion  Nivea Body Sheer Moisturizing Lotion Nivea Crme  Nivea Skin Firming Lotion  NutraDerm 30 Skin Lotion  NutraDerm Skin Lotion  NutraDerm Therapeutic Skin Cream  NutraDerm Therapeutic Skin Lotion  ProShield Protective Hand Cream  Provon moisturizing lotion    View Pre-Surgery Education Videos:  [email or web address]     Incentive Spirometer  An incentive spirometer is a tool that can help keep your lungs clear and active. This tool measures how well you are filling your lungs with each breath. Taking long deep breaths may help reverse or decrease the chance of developing breathing (pulmonary) problems (especially infection) following: A long period of time when you are unable to move or be active. BEFORE THE PROCEDURE  If the spirometer includes an indicator to show your best effort, your nurse or respiratory therapist will set it to a desired goal. If possible, sit up straight or lean slightly forward. Try not to slouch. Hold the incentive spirometer in  an upright position. INSTRUCTIONS FOR USE  Sit on the edge of your bed if possible, or sit up as far as you can in bed or on a chair. Hold the incentive spirometer in an upright position. Breathe out normally. Place the mouthpiece in your mouth and seal your lips tightly around it. Breathe in slowly and as deeply as possible, raising the piston or the ball toward the top of the column. Hold your breath for 3-5 seconds or for as long as possible. Allow the piston or ball to fall to the bottom of the column. Remove the mouthpiece from your mouth and breathe out normally. Rest for a few seconds and repeat Steps 1 through 7 at least 10 times every 1-2 hours when you are awake. Take your time and take a  few normal breaths between deep breaths. The spirometer may include an indicator to show your best effort. Use the indicator as a goal to work toward during each repetition. After each set of 10 deep breaths, practice coughing to be sure your lungs are clear. If you have an incision (the cut made at the time of surgery), support your incision when coughing by placing a pillow or rolled up towels firmly against it. Once you are able to get out of bed, walk around indoors and cough well. You may stop using the incentive spirometer when instructed by your caregiver.  RISKS AND COMPLICATIONS Take your time so you do not get dizzy or light-headed. If you are in pain, you may need to take or ask for pain medication before doing incentive spirometry. It is harder to take a deep breath if you are having pain. AFTER USE Rest and breathe slowly and easily. It can be helpful to keep track of a log of your progress. Your caregiver can provide you with a simple table to help with this. If you are using the spirometer at home, follow these instructions: SEEK MEDICAL CARE IF:  You are having difficultly using the spirometer. You have trouble using the spirometer as often as instructed. Your pain medication is not giving enough relief while using the spirometer. You develop fever of 100.5 F (38.1 C) or higher. SEEK IMMEDIATE MEDICAL CARE IF:  You cough up bloody sputum that had not been present before. You develop fever of 102 F (38.9 C) or greater. You develop worsening pain at or near the incision site. MAKE SURE YOU:  Understand these instructions. Will watch your condition. Will get help right away if you are not doing well or get worse. Document Released: 10/07/2006 Document Revised: 08/19/2011 Document Reviewed: 12/08/2006 [facility] Patient Information 2014 [place], [place].   ________________________________________________________________________

## 2023-09-10 NOTE — Progress Notes (Signed)
 COVID Vaccine Completed: yes  Date of COVID positive in last 90 days:  PCP - Sandford Craze, NP Cardiologist -   Medical clearance by Sandford Craze 08/15/23 in Epic   Chest x-ray - 08/24/23 Epic EKG - 08/15/23 Epic Stress Test -  ECHO -  Cardiac Cath -  Pacemaker/ICD device last checked: Spinal Cord Stimulator:  Bowel Prep -   Sleep Study -  CPAP -   Fasting Blood Sugar -  Checks Blood Sugar _____ times a day  Last dose of GLP1 agonist-  N/A GLP1 instructions:  Hold 7 days before surgery    Last dose of SGLT-2 inhibitors-  N/A SGLT-2 instructions:  Hold 3 days before surgery    Blood Thinner Instructions:  Last dose:   Time: Aspirin Instructions: Last Dose:  Activity level:  Can go up a flight of stairs and perform activities of daily living without stopping and without symptoms of chest pain or shortness of breath.  Able to exercise without symptoms  Unable to go up a flight of stairs without symptoms of     Anesthesia review:   Patient denies shortness of breath, fever, cough and chest pain at PAT appointment  Patient verbalized understanding of instructions that were given to them at the PAT appointment. Patient was also instructed that they will need to review over the PAT instructions again at home before surgery.

## 2023-09-11 ENCOUNTER — Encounter (HOSPITAL_COMMUNITY): Payer: Self-pay

## 2023-09-11 ENCOUNTER — Encounter (HOSPITAL_COMMUNITY)
Admission: RE | Admit: 2023-09-11 | Discharge: 2023-09-11 | Disposition: A | Discharge status: Home / Self Care | Source: Ambulatory Visit | Attending: Orthopedic Surgery | Admitting: Orthopedic Surgery

## 2023-09-11 ENCOUNTER — Other Ambulatory Visit: Payer: Self-pay

## 2023-09-11 VITALS — BP 164/64 | HR 64 | Temp 98.1°F | Resp 16 | Ht 67.0 in | Wt 173.1 lb

## 2023-09-11 DIAGNOSIS — Z01812 Encounter for preprocedural laboratory examination: Secondary | ICD-10-CM | POA: Diagnosis not present

## 2023-09-11 DIAGNOSIS — Z01818 Encounter for other preprocedural examination: Secondary | ICD-10-CM | POA: Diagnosis present

## 2023-09-11 DIAGNOSIS — R7303 Prediabetes: Secondary | ICD-10-CM

## 2023-09-11 DIAGNOSIS — I10 Essential (primary) hypertension: Secondary | ICD-10-CM | POA: Diagnosis not present

## 2023-09-11 LAB — CBC
HCT: 38.6 % (ref 36.0–46.0)
Hemoglobin: 12.5 g/dL (ref 12.0–15.0)
MCH: 30 pg (ref 26.0–34.0)
MCHC: 32.4 g/dL (ref 30.0–36.0)
MCV: 92.6 fL (ref 80.0–100.0)
Platelets: 283 10*3/uL (ref 150–400)
RBC: 4.17 MIL/uL (ref 3.87–5.11)
RDW: 13.8 % (ref 11.5–15.5)
WBC: 6.8 10*3/uL (ref 4.0–10.5)
nRBC: 0 % (ref 0.0–0.2)

## 2023-09-11 LAB — BASIC METABOLIC PANEL WITH GFR
Anion gap: 11 (ref 5–15)
BUN: 24 mg/dL — ABNORMAL HIGH (ref 8–23)
CO2: 26 mmol/L (ref 22–32)
Calcium: 9.3 mg/dL (ref 8.9–10.3)
Chloride: 98 mmol/L (ref 98–111)
Creatinine, Ser: 0.81 mg/dL (ref 0.44–1.00)
GFR, Estimated: >60 mL/min (ref >60)
Glucose, Bld: 100 mg/dL — ABNORMAL HIGH (ref 70–99)
Potassium: 3.5 mmol/L (ref 3.5–5.1)
Sodium: 135 mmol/L (ref 135–145)

## 2023-09-11 LAB — HEMOGLOBIN A1C
Hgb A1c MFr Bld: 5.7 % — ABNORMAL HIGH (ref 4.8–5.6)
Mean Plasma Glucose: 116.89 mg/dL

## 2023-09-11 LAB — SURGICAL PCR SCREEN
MRSA, PCR: NEGATIVE
Staphylococcus aureus: NEGATIVE

## 2023-09-13 ENCOUNTER — Other Ambulatory Visit: Payer: Self-pay | Admitting: Family

## 2023-09-23 ENCOUNTER — Ambulatory Visit (HOSPITAL_BASED_OUTPATIENT_CLINIC_OR_DEPARTMENT_OTHER): Payer: Self-pay | Admitting: Anesthesiology

## 2023-09-23 ENCOUNTER — Other Ambulatory Visit: Payer: Self-pay

## 2023-09-23 ENCOUNTER — Encounter (HOSPITAL_COMMUNITY): Payer: Self-pay | Admitting: Orthopedic Surgery

## 2023-09-23 ENCOUNTER — Observation Stay (HOSPITAL_COMMUNITY)
Admission: RE | Admit: 2023-09-23 | Discharge: 2023-09-24 | Disposition: A | Discharge status: Home / Self Care | Payer: Medicare Other | Attending: Orthopedic Surgery | Admitting: Orthopedic Surgery

## 2023-09-23 ENCOUNTER — Encounter (HOSPITAL_COMMUNITY)
Admission: RE | Disposition: A | Discharge status: Home / Self Care | Payer: Self-pay | Source: Home / Self Care | Attending: Orthopedic Surgery

## 2023-09-23 ENCOUNTER — Ambulatory Visit (HOSPITAL_COMMUNITY): Payer: Self-pay | Admitting: Anesthesiology

## 2023-09-23 DIAGNOSIS — Z8616 Personal history of COVID-19: Secondary | ICD-10-CM | POA: Insufficient documentation

## 2023-09-23 DIAGNOSIS — Z79899 Other long term (current) drug therapy: Secondary | ICD-10-CM | POA: Diagnosis not present

## 2023-09-23 DIAGNOSIS — I10 Essential (primary) hypertension: Secondary | ICD-10-CM | POA: Insufficient documentation

## 2023-09-23 DIAGNOSIS — E785 Hyperlipidemia, unspecified: Secondary | ICD-10-CM

## 2023-09-23 DIAGNOSIS — Z96641 Presence of right artificial hip joint: Secondary | ICD-10-CM | POA: Insufficient documentation

## 2023-09-23 DIAGNOSIS — M1712 Unilateral primary osteoarthritis, left knee: Secondary | ICD-10-CM | POA: Diagnosis not present

## 2023-09-23 DIAGNOSIS — Z87891 Personal history of nicotine dependence: Secondary | ICD-10-CM | POA: Insufficient documentation

## 2023-09-23 DIAGNOSIS — R7303 Prediabetes: Secondary | ICD-10-CM

## 2023-09-23 DIAGNOSIS — Z96652 Presence of left artificial knee joint: Principal | ICD-10-CM

## 2023-09-23 DIAGNOSIS — G8918 Other acute postprocedural pain: Secondary | ICD-10-CM | POA: Diagnosis not present

## 2023-09-23 SURGERY — ARTHROPLASTY, KNEE, TOTAL
Anesthesia: Monitor Anesthesia Care | Site: Knee | Laterality: Left

## 2023-09-23 MED ORDER — FENTANYL CITRATE PF 50 MCG/ML IJ SOSY
25.0000 ug | PREFILLED_SYRINGE | INTRAMUSCULAR | Status: DC | PRN
  Administered 2023-09-23 (×2): 50 ug via INTRAVENOUS

## 2023-09-23 MED ORDER — SODIUM CHLORIDE 0.9% FLUSH
3.0000 mL | Freq: Two times a day (BID) | INTRAVENOUS | Status: DC
  Administered 2023-09-24: 5 mL via INTRAVENOUS

## 2023-09-23 MED ORDER — OXYCODONE HCL 5 MG/5ML PO SOLN: 5.0000 mg | Freq: Once | ORAL | Status: AC | PRN

## 2023-09-23 MED ORDER — SODIUM CHLORIDE 0.9 % IR SOLN
Status: DC | PRN
  Administered 2023-09-23: 1000 mL

## 2023-09-23 MED ORDER — TRANEXAMIC ACID-NACL 1000-0.7 MG/100ML-% IV SOLN
1000.0000 mg | INTRAVENOUS | Status: AC
  Administered 2023-09-23: 1000 mg via INTRAVENOUS
  Filled 2023-09-23: qty 100

## 2023-09-23 MED ORDER — PROPOFOL 1000 MG/100ML IV EMUL
INTRAVENOUS | Status: AC
  Filled 2023-09-23: qty 100

## 2023-09-23 MED ORDER — ONDANSETRON HCL 4 MG PO TABS: 4.0000 mg | ORAL_TABLET | Freq: Four times a day (QID) | ORAL | Status: DC | PRN

## 2023-09-23 MED ORDER — CALCIUM POLYCARBOPHIL 625 MG PO TABS
625.0000 mg | ORAL_TABLET | Freq: Every day | ORAL | Status: DC
  Administered 2023-09-24: 625 mg via ORAL
  Filled 2023-09-23: qty 1

## 2023-09-23 MED ORDER — METOCLOPRAMIDE HCL 5 MG PO TABS: 5.0000 mg | ORAL_TABLET | Freq: Three times a day (TID) | ORAL | Status: DC | PRN

## 2023-09-23 MED ORDER — GABAPENTIN 300 MG PO CAPS: 300.0000 mg | ORAL_CAPSULE | Freq: Three times a day (TID) | ORAL | Status: DC | PRN

## 2023-09-23 MED ORDER — METOCLOPRAMIDE HCL 5 MG/ML IJ SOLN: 5.0000 mg | Freq: Three times a day (TID) | INTRAMUSCULAR | Status: DC | PRN

## 2023-09-23 MED ORDER — ATORVASTATIN CALCIUM 10 MG PO TABS
10.0000 mg | ORAL_TABLET | Freq: Every day | ORAL | Status: DC
Start: 2023-09-24 — End: 2023-09-24
  Administered 2023-09-24: 10 mg via ORAL
  Filled 2023-09-23: qty 1

## 2023-09-23 MED ORDER — SODIUM CHLORIDE 0.9% FLUSH: 3.0000 mL | INTRAVENOUS | Status: DC | PRN

## 2023-09-23 MED ORDER — PROPOFOL 10 MG/ML IV BOLUS
INTRAVENOUS | Status: DC | PRN
  Administered 2023-09-23: 72 ug/kg/min via INTRAVENOUS
  Administered 2023-09-23: 100 mg via INTRAVENOUS

## 2023-09-23 MED ORDER — TRANEXAMIC ACID-NACL 1000-0.7 MG/100ML-% IV SOLN
1000.0000 mg | Freq: Once | INTRAVENOUS | Status: AC
  Administered 2023-09-23: 1000 mg via INTRAVENOUS
  Filled 2023-09-23: qty 100

## 2023-09-23 MED ORDER — KETOROLAC TROMETHAMINE 30 MG/ML IJ SOLN
INTRAMUSCULAR | Status: AC
  Filled 2023-09-23: qty 1

## 2023-09-23 MED ORDER — DIPHENHYDRAMINE HCL 12.5 MG/5ML PO ELIX: 12.5000 mg | ORAL_SOLUTION | ORAL | Status: DC | PRN

## 2023-09-23 MED ORDER — SENNA 8.6 MG PO TABS
2.0000 | ORAL_TABLET | Freq: Every day | ORAL | Status: DC
  Administered 2023-09-23: 17.2 mg via ORAL
  Filled 2023-09-23: qty 2

## 2023-09-23 MED ORDER — OXYCODONE HCL 5 MG PO TABS
5.0000 mg | ORAL_TABLET | ORAL | Status: DC | PRN
  Administered 2023-09-23 – 2023-09-24 (×2): 5 mg via ORAL
  Filled 2023-09-23 (×2): qty 1

## 2023-09-23 MED ORDER — BUPIVACAINE IN DEXTROSE 0.75-8.25 % IT SOLN
INTRATHECAL | Status: DC | PRN
  Administered 2023-09-23: .8 mL via INTRATHECAL

## 2023-09-23 MED ORDER — CHLORTHALIDONE 25 MG PO TABS
25.0000 mg | ORAL_TABLET | Freq: Every day | ORAL | Status: DC
  Administered 2023-09-24: 25 mg via ORAL
  Filled 2023-09-23: qty 1

## 2023-09-23 MED ORDER — MIDAZOLAM HCL 2 MG/2ML IJ SOLN
INTRAMUSCULAR | Status: AC
  Filled 2023-09-23: qty 2

## 2023-09-23 MED ORDER — ORAL CARE MOUTH RINSE: 15.0000 mL | OROMUCOSAL | Status: DC | PRN

## 2023-09-23 MED ORDER — ONDANSETRON HCL 4 MG/2ML IJ SOLN: 4.0000 mg | Freq: Once | INTRAMUSCULAR | Status: DC | PRN

## 2023-09-23 MED ORDER — OXYCODONE HCL 5 MG PO TABS
ORAL_TABLET | ORAL | Status: AC
  Filled 2023-09-23: qty 1

## 2023-09-23 MED ORDER — ASPIRIN 81 MG PO CHEW
81.0000 mg | CHEWABLE_TABLET | Freq: Two times a day (BID) | ORAL | Status: DC
  Administered 2023-09-23 – 2023-09-24 (×2): 81 mg via ORAL
  Filled 2023-09-23 (×2): qty 1

## 2023-09-23 MED ORDER — FENTANYL CITRATE (PF) 100 MCG/2ML IJ SOLN
INTRAMUSCULAR | Status: DC | PRN
  Administered 2023-09-23: 100 ug via INTRAVENOUS

## 2023-09-23 MED ORDER — DEXAMETHASONE SODIUM PHOSPHATE 10 MG/ML IJ SOLN
INTRAMUSCULAR | Status: AC
  Filled 2023-09-23: qty 1

## 2023-09-23 MED ORDER — ALUM & MAG HYDROXIDE-SIMETH 200-200-20 MG/5ML PO SUSP: 30.0000 mL | ORAL | Status: DC | PRN

## 2023-09-23 MED ORDER — CEFAZOLIN SODIUM-DEXTROSE 2-4 GM/100ML-% IV SOLN
2.0000 g | INTRAVENOUS | Status: AC
  Administered 2023-09-23: 2 g via INTRAVENOUS
  Filled 2023-09-23: qty 100

## 2023-09-23 MED ORDER — SODIUM CHLORIDE 0.9 % IV SOLN: INTRAVENOUS | Status: DC | PRN

## 2023-09-23 MED ORDER — LACTATED RINGERS IV SOLN: INTRAVENOUS | Status: DC

## 2023-09-23 MED ORDER — METHOCARBAMOL 1000 MG/10ML IJ SOLN: 500.0000 mg | Freq: Four times a day (QID) | INTRAMUSCULAR | Status: DC | PRN

## 2023-09-23 MED ORDER — MIDAZOLAM HCL 2 MG/2ML IJ SOLN
INTRAMUSCULAR | Status: AC
  Administered 2023-09-23: 1 mg via INTRAVENOUS
  Filled 2023-09-23: qty 2

## 2023-09-23 MED ORDER — OXYCODONE HCL 5 MG PO TABS
5.0000 mg | ORAL_TABLET | Freq: Once | ORAL | Status: AC | PRN
  Administered 2023-09-23: 5 mg via ORAL

## 2023-09-23 MED ORDER — FENTANYL CITRATE PF 50 MCG/ML IJ SOSY
PREFILLED_SYRINGE | INTRAMUSCULAR | Status: AC
  Filled 2023-09-23: qty 1

## 2023-09-23 MED ORDER — SODIUM CHLORIDE 0.9% FLUSH: 3.0000 mL | Freq: Two times a day (BID) | INTRAVENOUS | Status: DC

## 2023-09-23 MED ORDER — PHENYLEPHRINE HCL (PRESSORS) 10 MG/ML IV SOLN
INTRAVENOUS | Status: AC
  Filled 2023-09-23: qty 1

## 2023-09-23 MED ORDER — ONDANSETRON HCL 4 MG/2ML IJ SOLN: 4.0000 mg | Freq: Four times a day (QID) | INTRAMUSCULAR | Status: DC | PRN

## 2023-09-23 MED ORDER — CELECOXIB 200 MG PO CAPS
200.0000 mg | ORAL_CAPSULE | Freq: Two times a day (BID) | ORAL | Status: DC
  Administered 2023-09-23: 200 mg via ORAL
  Filled 2023-09-23: qty 1

## 2023-09-23 MED ORDER — ORAL CARE MOUTH RINSE: 15.0000 mL | Freq: Once | OROMUCOSAL | Status: AC

## 2023-09-23 MED ORDER — FENTANYL CITRATE PF 50 MCG/ML IJ SOSY
PREFILLED_SYRINGE | INTRAMUSCULAR | Status: AC
  Administered 2023-09-23: 50 ug via INTRAVENOUS
  Filled 2023-09-23: qty 2

## 2023-09-23 MED ORDER — KETOROLAC TROMETHAMINE 30 MG/ML IJ SOLN
INTRAMUSCULAR | Status: DC | PRN
  Administered 2023-09-23: 30 mg

## 2023-09-23 MED ORDER — LIDOCAINE HCL (CARDIAC) PF 100 MG/5ML IV SOSY
PREFILLED_SYRINGE | INTRAVENOUS | Status: DC | PRN
Start: 2023-09-23 — End: 2023-09-23
  Administered 2023-09-23: 100 mg via INTRATRACHEAL

## 2023-09-23 MED ORDER — BISACODYL 10 MG RE SUPP: 10.0000 mg | Freq: Every day | RECTAL | Status: DC | PRN

## 2023-09-23 MED ORDER — ONDANSETRON HCL 4 MG/2ML IJ SOLN
INTRAMUSCULAR | Status: AC
  Filled 2023-09-23: qty 2

## 2023-09-23 MED ORDER — DEXAMETHASONE SODIUM PHOSPHATE 10 MG/ML IJ SOLN
INTRAMUSCULAR | Status: DC | PRN
  Administered 2023-09-23: 8 mg via INTRAVENOUS

## 2023-09-23 MED ORDER — OXYCODONE HCL 5 MG PO TABS
10.0000 mg | ORAL_TABLET | ORAL | Status: DC | PRN
  Administered 2023-09-24: 10 mg via ORAL
  Administered 2023-09-24 (×2): 15 mg via ORAL
  Filled 2023-09-23: qty 3
  Filled 2023-09-23: qty 2
  Filled 2023-09-23: qty 3

## 2023-09-23 MED ORDER — LIDOCAINE HCL (PF) 2 % IJ SOLN
INTRAMUSCULAR | Status: AC
  Filled 2023-09-23: qty 5

## 2023-09-23 MED ORDER — MIDAZOLAM HCL 2 MG/2ML IJ SOLN
INTRAMUSCULAR | Status: DC | PRN
  Administered 2023-09-23: 2 mg via INTRAVENOUS

## 2023-09-23 MED ORDER — DEXAMETHASONE SODIUM PHOSPHATE 10 MG/ML IJ SOLN: 8.0000 mg | Freq: Once | INTRAMUSCULAR | Status: DC

## 2023-09-23 MED ORDER — 0.9 % SODIUM CHLORIDE (POUR BTL) OPTIME
TOPICAL | Status: DC | PRN
Start: 2023-09-23 — End: 2023-09-23
  Administered 2023-09-23: 1000 mL

## 2023-09-23 MED ORDER — STERILE WATER FOR IRRIGATION IR SOLN
Status: DC | PRN
  Administered 2023-09-23: 1000 mL

## 2023-09-23 MED ORDER — POLYETHYLENE GLYCOL 3350 17 G PO PACK
17.0000 g | PACK | Freq: Two times a day (BID) | ORAL | Status: DC
  Administered 2023-09-24: 17 g via ORAL
  Filled 2023-09-23: qty 1

## 2023-09-23 MED ORDER — PHENYLEPHRINE HCL-NACL 20-0.9 MG/250ML-% IV SOLN
INTRAVENOUS | Status: DC | PRN
  Administered 2023-09-23: 35 ug/min via INTRAVENOUS

## 2023-09-23 MED ORDER — MEPERIDINE HCL 50 MG/ML IJ SOLN: 6.2500 mg | INTRAMUSCULAR | Status: DC | PRN

## 2023-09-23 MED ORDER — MENTHOL 3 MG MT LOZG: 1.0000 | LOZENGE | OROMUCOSAL | Status: DC | PRN

## 2023-09-23 MED ORDER — METHOCARBAMOL 500 MG PO TABS
500.0000 mg | ORAL_TABLET | Freq: Four times a day (QID) | ORAL | Status: DC | PRN
  Administered 2023-09-23: 500 mg via ORAL
  Filled 2023-09-23: qty 1

## 2023-09-23 MED ORDER — POVIDONE-IODINE 10 % EX SWAB: 2.0000 | Freq: Once | CUTANEOUS | Status: DC

## 2023-09-23 MED ORDER — ACETAMINOPHEN 500 MG PO TABS
1000.0000 mg | ORAL_TABLET | Freq: Four times a day (QID) | ORAL | Status: DC
  Administered 2023-09-23 – 2023-09-24 (×4): 1000 mg via ORAL
  Filled 2023-09-23 (×4): qty 2

## 2023-09-23 MED ORDER — PHENOL 1.4 % MT LIQD: 1.0000 | OROMUCOSAL | Status: DC | PRN

## 2023-09-23 MED ORDER — CHLORHEXIDINE GLUCONATE 0.12 % MT SOLN
15.0000 mL | Freq: Once | OROMUCOSAL | Status: AC
  Administered 2023-09-23: 15 mL via OROMUCOSAL

## 2023-09-23 MED ORDER — PHENYLEPHRINE 80 MCG/ML (10ML) SYRINGE FOR IV PUSH (FOR BLOOD PRESSURE SUPPORT)
PREFILLED_SYRINGE | INTRAVENOUS | Status: AC
  Filled 2023-09-23: qty 10

## 2023-09-23 MED ORDER — HYDROMORPHONE HCL 1 MG/ML IJ SOLN: 0.5000 mg | INTRAMUSCULAR | Status: DC | PRN

## 2023-09-23 MED ORDER — FENTANYL CITRATE (PF) 100 MCG/2ML IJ SOLN
INTRAMUSCULAR | Status: AC
Start: 2023-09-23 — End: ?
  Filled 2023-09-23: qty 2

## 2023-09-23 MED ORDER — AMLODIPINE BESYLATE 5 MG PO TABS
2.5000 mg | ORAL_TABLET | Freq: Two times a day (BID) | ORAL | Status: DC
  Administered 2023-09-23 – 2023-09-24 (×2): 2.5 mg via ORAL
  Filled 2023-09-23 (×2): qty 1

## 2023-09-23 MED ORDER — BUPIVACAINE-EPINEPHRINE (PF) 0.25% -1:200000 IJ SOLN
INTRAMUSCULAR | Status: AC
  Filled 2023-09-23: qty 30

## 2023-09-23 MED ORDER — CEFAZOLIN SODIUM-DEXTROSE 2-4 GM/100ML-% IV SOLN
2.0000 g | Freq: Four times a day (QID) | INTRAVENOUS | Status: AC
  Administered 2023-09-23 – 2023-09-24 (×2): 2 g via INTRAVENOUS
  Filled 2023-09-23 (×2): qty 100

## 2023-09-23 MED ORDER — SODIUM CHLORIDE (PF) 0.9 % IJ SOLN
INTRAMUSCULAR | Status: DC | PRN
  Administered 2023-09-23: 30 mL

## 2023-09-23 MED ORDER — SODIUM CHLORIDE (PF) 0.9 % IJ SOLN
INTRAMUSCULAR | Status: AC
  Filled 2023-09-23: qty 30

## 2023-09-23 MED ORDER — MIDAZOLAM HCL 2 MG/2ML IJ SOLN: 2.0000 mg | Freq: Once | INTRAMUSCULAR | Status: DC

## 2023-09-23 MED ORDER — BUPIVACAINE-EPINEPHRINE (PF) 0.5% -1:200000 IJ SOLN
INTRAMUSCULAR | Status: DC | PRN
  Administered 2023-09-23: 20 mL via PERINEURAL

## 2023-09-23 MED ORDER — ONDANSETRON HCL 4 MG/2ML IJ SOLN
INTRAMUSCULAR | Status: DC | PRN
  Administered 2023-09-23: 4 mg via INTRAVENOUS

## 2023-09-23 MED ORDER — BUPIVACAINE-EPINEPHRINE (PF) 0.25% -1:200000 IJ SOLN
INTRAMUSCULAR | Status: DC | PRN
  Administered 2023-09-23: 30 mL via PERINEURAL

## 2023-09-23 MED ORDER — DEXAMETHASONE SODIUM PHOSPHATE 10 MG/ML IJ SOLN
10.0000 mg | Freq: Once | INTRAMUSCULAR | Status: AC
  Administered 2023-09-24: 10 mg via INTRAVENOUS
  Filled 2023-09-23: qty 1

## 2023-09-23 MED ORDER — FENTANYL CITRATE PF 50 MCG/ML IJ SOSY: 100.0000 ug | PREFILLED_SYRINGE | Freq: Once | INTRAMUSCULAR | Status: AC

## 2023-09-23 SURGICAL SUPPLY — 47 items
ATTUNE MED ANAT PAT 32 KNEE (Knees) IMPLANT
BAG COUNTER SPONGE SURGICOUNT (BAG) IMPLANT
BAG ZIPLOCK 12X15 (MISCELLANEOUS) IMPLANT
BASEPLATE TIB CMT FB PCKT SZ5 (Knees) IMPLANT
BLADE SAW SGTL 13.0X1.19X90.0M (BLADE) ×1 IMPLANT
BNDG ELASTIC 6INX 5YD STR LF (GAUZE/BANDAGES/DRESSINGS) ×1 IMPLANT
BOWL SMART MIX CTS (DISPOSABLE) ×1 IMPLANT
CEMENT HV SMART SET (Cement) ×2 IMPLANT
COMP FEM CMT ATTUNE NRW 5 LT (Joint) ×1 IMPLANT
COMPONENT FEM CMT ATTN NRW 5LT (Joint) IMPLANT
COVER SURGICAL LIGHT HANDLE (MISCELLANEOUS) ×1 IMPLANT
CUFF TRNQT CYL 34X4.125X (TOURNIQUET CUFF) ×1 IMPLANT
DERMABOND ADVANCED .7 DNX12 (GAUZE/BANDAGES/DRESSINGS) ×1 IMPLANT
DRAPE U-SHAPE 47X51 STRL (DRAPES) ×1 IMPLANT
DRESSING AQUACEL AG SP 3.5X10 (GAUZE/BANDAGES/DRESSINGS) ×1 IMPLANT
DRSG AQUACEL AG SP 3.5X10 (GAUZE/BANDAGES/DRESSINGS) ×1 IMPLANT
DURAPREP 26ML APPLICATOR (WOUND CARE) ×2 IMPLANT
ELECT REM PT RETURN 15FT ADLT (MISCELLANEOUS) ×1 IMPLANT
GLOVE BIO SURGEON STRL SZ 6 (GLOVE) ×1 IMPLANT
GLOVE BIOGEL PI IND STRL 6.5 (GLOVE) ×1 IMPLANT
GLOVE BIOGEL PI IND STRL 7.5 (GLOVE) ×1 IMPLANT
GLOVE ORTHO TXT STRL SZ7.5 (GLOVE) ×2 IMPLANT
GOWN STRL REUS W/ TWL LRG LVL3 (GOWN DISPOSABLE) ×2 IMPLANT
HOLDER FOLEY CATH W/STRAP (MISCELLANEOUS) IMPLANT
INSERT MED CMT ATTUNE 5X5 LT (Insert) IMPLANT
KIT TURNOVER KIT A (KITS) ×1 IMPLANT
MANIFOLD NEPTUNE II (INSTRUMENTS) ×1 IMPLANT
NDL SAFETY ECLIPSE 18X1.5 (NEEDLE) IMPLANT
NS IRRIG 1000ML POUR BTL (IV SOLUTION) ×1 IMPLANT
PACK TOTAL KNEE CUSTOM (KITS) ×1 IMPLANT
PENCIL SMOKE EVACUATOR (MISCELLANEOUS) ×1 IMPLANT
PIN FIX SIGMA LCS THRD HI (PIN) IMPLANT
PROTECTOR NERVE ULNAR (MISCELLANEOUS) ×1 IMPLANT
SET HNDPC FAN SPRY TIP SCT (DISPOSABLE) ×1 IMPLANT
SET PAD KNEE POSITIONER (MISCELLANEOUS) ×1 IMPLANT
SPIKE FLUID TRANSFER (MISCELLANEOUS) ×2 IMPLANT
SUT MNCRL AB 4-0 PS2 18 (SUTURE) ×1 IMPLANT
SUT STRATAFIX PDS+ 0 24IN (SUTURE) ×1 IMPLANT
SUT VIC AB 0 CT1 36 (SUTURE) ×1 IMPLANT
SUT VIC AB 1 CT1 36 (SUTURE) ×1 IMPLANT
SUT VIC AB 2-0 CT1 TAPERPNT 27 (SUTURE) ×2 IMPLANT
SYR 3ML LL SCALE MARK (SYRINGE) ×1 IMPLANT
TOWEL GREEN STERILE FF (TOWEL DISPOSABLE) ×1 IMPLANT
TRAY FOLEY MTR SLVR 14FR STAT (SET/KITS/TRAYS/PACK) IMPLANT
TUBE SUCTION HIGH CAP CLEAR NV (SUCTIONS) ×1 IMPLANT
WATER STERILE IRR 1000ML POUR (IV SOLUTION) ×2 IMPLANT
WRAP KNEE MAXI GEL POST OP (GAUZE/BANDAGES/DRESSINGS) ×1 IMPLANT

## 2023-09-23 NOTE — Anesthesia Procedure Notes (Signed)
 Spinal  Patient location during procedure: OR Start time: 09/23/2023 1:25 PM End time: 09/23/2023 1:28 PM Reason for block: surgical anesthesia Staffing Anesthesiologist: Rhenda Cedars, MD Performed by: Rhenda Cedars, MD Authorized by: Rhenda Cedars, MD   Preanesthetic Checklist Completed: patient identified, IV checked, site marked, risks and benefits discussed, surgical consent, monitors and equipment checked, pre-op evaluation and timeout performed Spinal Block Patient position: sitting Prep: DuraPrep Patient monitoring: heart rate, cardiac monitor, continuous pulse ox and blood pressure Approach: midline Location: L3-4 Injection technique: single-shot Needle Needle type: Sprotte  Needle gauge: 24 G Needle length: 9 cm Assessment Sensory level: T4 Events: CSF return

## 2023-09-23 NOTE — Discharge Instructions (Signed)

## 2023-09-23 NOTE — Transfer of Care (Signed)
 Immediate Anesthesia Transfer of Care Note  Patient: Patricia Lee  Procedure(s) Performed: ARTHROPLASTY, KNEE, TOTAL (Left: Knee)  Patient Location: PACU  Anesthesia Type:Spinal  Level of Consciousness: awake, alert , and oriented  Airway & Oxygen Therapy: Patient Spontanous Breathing and Patient connected to nasal cannula oxygen  Post-op Assessment: Report given to RN and Post -op Vital signs reviewed and stable  Post vital signs: Reviewed and stable  Last Vitals:  Vitals Value Taken Time  BP 124/56 09/23/23 1502  Temp 36.7 04/15/2501502  Pulse 85 09/23/23 1504  Resp 16 09/23/23 1504  SpO2 99 % 09/23/23 1504  Vitals shown include unfiled device data.  Last Pain:  Vitals:   09/23/23 1310  TempSrc:   PainSc: 0-No pain         Complications: No notable events documented.

## 2023-09-23 NOTE — Anesthesia Postprocedure Evaluation (Signed)
 Anesthesia Post Note  Patient: Patricia Lee  Procedure(s) Performed: ARTHROPLASTY, KNEE, TOTAL (Left: Knee)     Patient location during evaluation: PACU Anesthesia Type: Regional, MAC and Spinal Level of consciousness: oriented and awake and alert Pain management: pain level controlled Vital Signs Assessment: post-procedure vital signs reviewed and stable Respiratory status: spontaneous breathing, respiratory function stable and patient connected to nasal cannula oxygen Cardiovascular status: blood pressure returned to baseline and stable Postop Assessment: no headache, no backache and no apparent nausea or vomiting Anesthetic complications: no   No notable events documented.  Last Vitals:  Vitals:   09/23/23 1600 09/23/23 1617  BP: 124/75 107/79  Pulse: 91 87  Resp: (!) 22 18  Temp:  36.5 C  SpO2: 100% 100%    Last Pain:  Vitals:   09/23/23 1617  TempSrc: Oral  PainSc:                  Joanthan Hlavacek

## 2023-09-23 NOTE — Anesthesia Procedure Notes (Signed)
 Anesthesia Regional Block: Adductor canal block   Pre-Anesthetic Checklist: , timeout performed,  Correct Patient, Correct Site, Correct Laterality,  Correct Procedure, Correct Position, site marked,  Risks and benefits discussed,  Surgical consent,  Pre-op evaluation,  At surgeon's request and post-op pain management  Laterality: Left  Prep: chloraprep       Needles:  Injection technique: Single-shot  Needle Type: Echogenic Stimulator Needle     Needle Length: 5cm  Needle Gauge: 22     Additional Needles:   Procedures:,,,, ultrasound used (permanent image in chart),,    Narrative:  Start time: 09/23/2023 12:37 PM End time: 09/23/2023 12:40 PM Injection made incrementally with aspirations every 5 mL.  Performed by: Personally  Anesthesiologist: Rhenda Cedars, MD  Additional Notes: Functioning IV was confirmed and monitors were applied.  A 50mm 22ga Arrow echogenic stimulator needle was used. Sterile prep and drape,hand hygiene and sterile gloves were used. Ultrasound guidance: relevant anatomy identified, needle position confirmed, local anesthetic spread visualized around nerve(s)., vascular puncture avoided.  Image printed for medical record. Negative aspiration and negative test dose prior to incremental administration of local anesthetic. The patient tolerated the procedure well.

## 2023-09-23 NOTE — H&P (Signed)
 TOTAL KNEE ADMISSION H&P  Patient is being admitted for left total knee arthroplasty.  Therapy Plans: outpatient therapy at [facility] Disposition: Home with son Planned DVT Prophylaxis: aspirin 81mg  BID DME needed: none PCP: Sandford Craze - clearance received TXA: IV Allergies: codeine - tachycardia, prednisone - tachycardia Anesthesia Concerns: none BMI: 28 Last HgbA1c: Not diabetic   Other: - oxycodone, robaxin, tylenol, celebrex - No hx of VTE or cancer - staying overnight   Subjective:  Chief Complaint:left knee pain.  HPI: Patricia Lee, 78 y.o. female, has a history of pain and functional disability in the left knee due to arthritis and has failed non-surgical conservative treatments for greater than 12 weeks to includeNSAID's and/or analgesics, corticosteriod injections, and activity modification.  Onset of symptoms was gradual, starting 2 years ago with gradually worsening course since that time. The patient noted no past surgery on the left knee(s).  Patient currently rates pain in the left knee(s) at 8 out of 10 with activity. Patient has worsening of pain with activity and weight bearing and pain that interferes with activities of daily living.  Patient has evidence of joint space narrowing by imaging studies. There is no active infection.  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   History of skin cancer 07/28/2012   HERPES ZOSTER 11/27/2008   PREMATURE VENTRICULAR CONTRACTIONS 11/17/2008   Essential hypertension 09/10/2007   Past Medical History:  Diagnosis Date   Allergy    spring  seasonal   Arthritis    hip    Borderline diabetes 12/06/2016   Hyperlipidemia 10/21/2016   Hypertension    Pneumonia 2019   Preventative health care 02/19/2022    Past Surgical History:  Procedure Laterality Date   COLONOSCOPY     JOINT REPLACEMENT  Right hip   POLYPECTOMY     TOTAL HIP ARTHROPLASTY Right 04/09/2016   Procedure: RIGHT TOTAL HIP ARTHROPLASTY ANTERIOR APPROACH;  Surgeon: Durene Romans, MD;  Location: [facility];  Service: Orthopedics;  Laterality: Right;    No current facility-administered medications for this encounter.   Current Outpatient Medications  Medication Sig Dispense Refill Last Dose/Taking   APPLE CIDER VINEGAR PO Take 1 tablet by mouth daily.   Taking   atorvastatin (LIPITOR) 10 MG tablet TAKE 1 TABLET BY MOUTH DAILY 100 tablet 1 Taking   betamethasone dipropionate 0.05 % cream Apply topically 2 (two) times daily. (Patient taking differently: Apply 1 Application topically daily as needed (flare up).) 30 g 0 Taking Differently   chlorthalidone (HYGROTON) 25 MG tablet TAKE 1 TABLET BY MOUTH DAILY 100 tablet 2 Taking   Cholecalciferol (VITAMIN D) 50 MCG (2000 UT) tablet Take 2,000 Units by mouth daily.   Taking   Collagen-Vitamin C-Biotin (COLLAGEN PO) Take 2,500 mg by mouth daily.   Taking   gabapentin (NEURONTIN) 300 MG capsule Take 1 capsule (300 mg total) by mouth once daily as needed (back pain). 30 capsule 1 Taking As Needed   Multiple Vitamins-Minerals (MULTIVITAMIN WITH MINERALS) tablet Take 1 tablet by mouth daily.   Taking   Omega-3 Fatty Acids ([employer] FISH OIL) 1200 MG CAPS Take 1,200 mg by  mouth daily.   Taking   polycarbophil (FIBERCON) 625 MG tablet Take 625 mg by mouth daily.   Taking   Turmeric 500 MG CAPS Take 500 mg by mouth daily.   Taking   amLODipine (NORVASC) 2.5 MG tablet TAKE 1 TABLET BY MOUTH TWICE  DAILY 90 tablet 1    Allergies  Allergen Reactions   Codeine Other (See Comments)    Tachycardia and insomnia   Erythromycin Other (See  Comments)    Tachycardia and insomnia   Influenza Vaccines Other (See Comments)    Nerve reaction similar to shingles- all nerves in back on fire    Prednisone Other (See Comments)    Tachycardia and insomnia   Tetracyclines & Related Other (See Comments)    Tachycardia/insomnia    Social History   Tobacco Use   Smoking status: Former    Current packs/day: 0.00    Average packs/day: 0.3 packs/day for 20.0 years (5.0 ttl pk-yrs)    Types: Cigarettes    Start date: 06/10/1973    Quit date: 06/10/1993    Years since quitting: 30.3   Smokeless tobacco: Never  Substance Use Topics   Alcohol use: Yes    Alcohol/week: 7.0 standard drinks of alcohol    Types: 7 Glasses of wine per week    Comment: wine, 1-2 glasses every other day    Family History  Problem Relation Age of Onset   Stomach cancer Mother        died at 70   Arthritis Mother    Cancer Mother    Colon cancer Father        died at 110   Cancer Father    Heart failure Maternal Grandmother    Cancer Paternal Grandmother    Colon polyps Neg Hx    Rectal cancer Neg Hx    Esophageal cancer Neg Hx    Pancreatic cancer Neg Hx    Liver disease Neg Hx      Review of Systems  Constitutional:  Negative for chills and fever.  Respiratory:  Negative for cough and shortness of breath.   Cardiovascular:  Negative for chest pain.  Gastrointestinal:  Negative for nausea and vomiting.  Musculoskeletal:  Positive for arthralgias.     Objective:  Physical Exam Well nourished and well developed. General: Alert and oriented x3, cooperative and pleasant, no acute distress.  Musculoskeletal: Left knee exam: No palpable effusion, warmth erythema Slight flexion contracture with flexion over 110 degrees Most of her tenderness is over the medial anterior aspect knee associated with mild genu varum Normal ipsilateral left hip exam without groin pain or referred pain Right knee exam reveals no palpable effusion with full knee  extension and flexion without as much tenderness medially   Vital signs in last 24 hours:    Labs:   Estimated body mass index is 27.11 kg/m as calculated from the following:   Height as of 09/11/23: 5\' 7"  (1.702 m).   Weight as of 09/11/23: 78.5 kg.   Imaging Review Plain radiographs demonstrate severe degenerative joint disease of the left knee(s). The bone quality appears to be adequate for age and reported activity level.      Assessment/Plan:  End stage arthritis, left knee   The patient history, physical examination, clinical judgment of the provider and imaging studies are consistent with end stage degenerative joint disease of the left knee(s) and total knee arthroplasty is deemed medically necessary. The treatment options including medical management, injection therapy arthroscopy and  arthroplasty were discussed at length. The risks and benefits of total knee arthroplasty were presented and reviewed. The risks due to aseptic loosening, infection, stiffness, patella tracking problems, thromboembolic complications and other imponderables were discussed. The patient acknowledged the explanation, agreed to proceed with the plan and consent was signed. Patient is being admitted for inpatient treatment for surgery, pain control, PT, OT, prophylactic antibiotics, VTE prophylaxis, progressive ambulation and ADL's and discharge planning. The patient is planning to be discharged  home.     Patient's anticipated LOS is less than 2 midnights, meeting these requirements: - Younger than 41 - Lives within 1 hour of care - Has a competent adult at home to recover with post-op recover - NO history of  - Chronic pain requiring opiods  - Diabetes  - Coronary Artery Disease  - Heart failure  - Heart attack  - Stroke  - DVT/VTE  - Cardiac arrhythmia  - Respiratory Failure/COPD  - Renal failure  - Anemia  - Advanced Liver disease  Kim Pen, PA-C Orthopedic Surgery [facility]  [place] [phone]

## 2023-09-23 NOTE — Op Note (Signed)
 NAME:  Patricia Lee                      MEDICAL RECORD NO.:  [id number]                             FACILITY:  [facility]      PHYSICIAN:  Azalea Lento. Bernard Brick, M.D.  DATE OF BIRTH:  Aug 17, 1945      DATE OF PROCEDURE:  09/23/2023                                     OPERATIVE REPORT         PREOPERATIVE DIAGNOSIS:  Left knee osteoarthritis.      POSTOPERATIVE DIAGNOSIS:  Left knee osteoarthritis.      FINDINGS:  The patient was noted to have complete loss of cartilage and   bone-on-bone arthritis with associated osteophytes in the medial and patellofemoral compartments of   the knee with a significant synovitis and associated effusion.  The patient had failed months of conservative treatment including medications, injection therapy, activity modification.     PROCEDURE:  Left total knee replacement.      COMPONENTS USED:  DePuy Attune FB CR MS knee   system, a size 5N femur, 5 tibia, size 5 mm CR MS AOX insert, and 32 anatomic patellar   button.      SURGEON:  Azalea Lento. Bernard Brick, M.D.      ASSISTANT:  Kim Pen, PA-C.      ANESTHESIA:  Regional and Spinal.      SPECIMENS:  None.      COMPLICATION:  None.      DRAINS:  None.  EBL: <100 cc      TOURNIQUET TIME:  30 min at 225 mmHg     The patient was stable to the recovery room.      INDICATION FOR PROCEDURE:  Patricia Lee is a 78 y.o. female patient of   mine.  The patient had been seen, evaluated, and treated for months conservatively in the   office with medication, activity modification, and injections.  The patient had   radiographic changes of bone-on-bone arthritis with endplate sclerosis and osteophytes noted.  Based on the radiographic changes and failed conservative measures, the patient   decided to proceed with definitive treatment, total knee replacement.  Risks of infection, DVT, component failure, need for revision surgery, neurovascular injury were reviewed in the office setting.  The postop course was reviewed  stressing the efforts to maximize post-operative satisfaction and function.  Consent was obtained for benefit of pain   relief.      PROCEDURE IN DETAIL:  The patient was brought to the operative theater.   Once adequate anesthesia, preoperative antibiotics, 2 gm of Ancef,1 gm of Tranexamic Acid, and 10 mg of Decadron administered, the patient was positioned supine with a left thigh tourniquet placed.  The  left lower extremity was prepped and draped in sterile fashion.  A time-   out was performed identifying the patient, planned procedure, and the appropriate extremity.      The left lower extremity was placed in the [facility] leg holder.  The leg was   exsanguinated, tourniquet elevated to 225 mmHg.  A midline incision was   made followed by median parapatellar arthrotomy.  Following initial   exposure, attention was  first directed to the patella.  Precut   measurement was noted to be 23 mm.  I resected down to 13 mm and used a   32 anatomic patellar button to restore patellar height as well as cover the cut surface.      The lug holes were drilled and a metal shim was placed to protect the   patella from retractors and saw blade during the procedure.      At this point, attention was now directed to the femur.  The femoral   canal was opened with a drill, irrigated to try to prevent fat emboli.  An   intramedullary rod was passed at 3 degrees valgus, 9 mm of bone was   resected off the distal femur.  Following this resection, the tibia was   subluxated anteriorly.  Using the extramedullary guide, 2 mm of bone was resected off   the proximal medial tibia.  We confirmed the gap would be   stable medially and laterally with a size 5 spacer block as well as confirmed that the tibial cut was perpendicular in the coronal plane, checking with an alignment rod.      Once this was done, I sized the femur to be a size 5 in the anterior-   posterior dimension, chose a narrow component based on  medial and   lateral dimension.  The size 5 rotation block was then pinned in   position anterior referenced using the C-clamp to set rotation.  The   anterior, posterior, and  chamfer cuts were made without difficulty nor   notching making certain that I was along the anterior cortex to help   with flexion gap stability.      The final box cut was made off the lateral aspect of distal femur.      At this point, the tibia was sized to be a size 5.  The size 5 tray was   then pinned in position through the medial third of the tubercle,   drilled, and keel punched.  Trial reduction was now carried with a 5 femur,  5 tibia, a size 5 mm CR MS insert, and the 32 anatomic patella botton.  The knee was brought to full extension with good flexion stability with the patella   tracking through the trochlea without application of pressure.  Given   all these findings the trial components removed.  Final components were   opened and cement was mixed.  The knee was irrigated with normal saline solution and pulse lavage.  The synovial lining was   then injected with 30 cc of 0.25% Marcaine with epinephrine, 1 cc of Toradol and 30 cc of NS for a total of 61 cc.     Final implants were then cemented onto cleaned and dried cut surfaces of bone with the knee brought to extension with a size 5 mm CR MS trial insert.      Once the cement had fully cured, excess cement was removed   throughout the knee.  I confirmed that I was satisfied with the range of   motion and stability, and the final size 5 mm CR MS AOX insert was chosen.  It was   placed into the knee.      The tourniquet had been let down at 30 minutes.  No significant   hemostasis was required.  The extensor mechanism was then reapproximated using #1 Vicryl and #1 Stratafix sutures with the knee   in flexion.  The   remaining wound was closed with 2-0 Vicryl and running 4-0 Monocryl.   The knee was cleaned, dried, dressed sterilely using Dermabond  and   Aquacel dressing.  The patient was then   brought to recovery room in stable condition, tolerating the procedure   well.   Please note that Physician Assistant, Kim Pen, PA-C was present for the entirety of the case, and was utilized for pre-operative positioning, peri-operative retractor management, general facilitation of the procedure and for primary wound closure at the end of the case.              Azalea Lento Bernard Brick, M.D.    09/23/2023 12:14 PM

## 2023-09-23 NOTE — Care Plan (Signed)
 Ortho Bundle Case Management Note  Patient Details  Name: Patricia Lee MRN: [id number] Date of Birth: 23-Mar-1946                  L TKA on 09/23/23. DCP: Home with son. DME: No needs. Has RW and cane. PT: [facility]. Referral faxed.   DME Arranged:  N/A DME Agency:       Additional Comments: Please contact me with any questions of if this plan should need to change.    Bronwen Canon, Certified Case Manager [facility]  [phone] 09/23/2023, 1:23 PM

## 2023-09-23 NOTE — Anesthesia Preprocedure Evaluation (Signed)
 Anesthesia Evaluation  Patient identified by MRN, date of birth, ID band Patient awake    Reviewed: Allergy & Precautions, NPO status , Patient's Chart, lab work & pertinent test results  Airway Mallampati: II  TM Distance: >3 FB Neck ROM: Full    Dental no notable dental hx.    Pulmonary former smoker   Pulmonary exam normal breath sounds clear to auscultation       Cardiovascular hypertension, Pt. on medications Normal cardiovascular exam Rhythm:Regular Rate:Normal     Neuro/Psych negative neurological ROS  negative psych ROS   GI/Hepatic negative GI ROS, Neg liver ROS,,,  Endo/Other  negative endocrine ROS    Renal/GU negative Renal ROS  negative genitourinary   Musculoskeletal negative musculoskeletal ROS (+)    Abdominal   Peds negative pediatric ROS (+)  Hematology negative hematology ROS (+)   Anesthesia Other Findings   Reproductive/Obstetrics negative OB ROS                             Anesthesia Physical Anesthesia Plan  ASA: 3  Anesthesia Plan: Spinal, Regional and MAC   Post-op Pain Management: Minimal or no pain anticipated, Tylenol PO (pre-op)* and Celebrex PO (pre-op)*   Induction: Intravenous  PONV Risk Score and Plan: 2 and Propofol infusion  Airway Management Planned: Simple Face Mask, Nasal Cannula and Natural Airway  Additional Equipment: None  Intra-op Plan:   Post-operative Plan:   Informed Consent: I have reviewed the patients History and Physical, chart, labs and discussed the procedure including the risks, benefits and alternatives for the proposed anesthesia with the patient or authorized representative who has indicated his/her understanding and acceptance.     Dental advisory given  Plan Discussed with: CRNA and Anesthesiologist  Anesthesia Plan Comments: (Discussed risks and benefits of and differences between spinal and general. Discussed  risks of spinal including headache, backache, failure, bleeding and hematoma, infection, and nerve damage. Patient consents to spinal. Questions answered. Platelet count acceptable.)        Anesthesia Quick Evaluation

## 2023-09-24 ENCOUNTER — Encounter (HOSPITAL_COMMUNITY): Payer: Self-pay | Admitting: Orthopedic Surgery

## 2023-09-24 ENCOUNTER — Other Ambulatory Visit (HOSPITAL_COMMUNITY): Payer: Self-pay

## 2023-09-24 DIAGNOSIS — Z87891 Personal history of nicotine dependence: Secondary | ICD-10-CM | POA: Diagnosis not present

## 2023-09-24 DIAGNOSIS — I10 Essential (primary) hypertension: Secondary | ICD-10-CM | POA: Diagnosis not present

## 2023-09-24 DIAGNOSIS — Z8616 Personal history of COVID-19: Secondary | ICD-10-CM | POA: Diagnosis not present

## 2023-09-24 DIAGNOSIS — Z96641 Presence of right artificial hip joint: Secondary | ICD-10-CM | POA: Diagnosis not present

## 2023-09-24 DIAGNOSIS — Z79899 Other long term (current) drug therapy: Secondary | ICD-10-CM | POA: Diagnosis not present

## 2023-09-24 DIAGNOSIS — M1712 Unilateral primary osteoarthritis, left knee: Secondary | ICD-10-CM | POA: Diagnosis not present

## 2023-09-24 LAB — CBC
HCT: 33.8 % — ABNORMAL LOW (ref 36.0–46.0)
Hemoglobin: 10.7 g/dL — ABNORMAL LOW (ref 12.0–15.0)
MCH: 29.6 pg (ref 26.0–34.0)
MCHC: 31.7 g/dL (ref 30.0–36.0)
MCV: 93.6 fL (ref 80.0–100.0)
Platelets: 250 10*3/uL (ref 150–400)
RBC: 3.61 MIL/uL — ABNORMAL LOW (ref 3.87–5.11)
RDW: 13.5 % (ref 11.5–15.5)
WBC: 12.8 10*3/uL — ABNORMAL HIGH (ref 4.0–10.5)
nRBC: 0 % (ref 0.0–0.2)

## 2023-09-24 LAB — BASIC METABOLIC PANEL WITH GFR
Anion gap: 7 (ref 5–15)
BUN: 28 mg/dL — ABNORMAL HIGH (ref 8–23)
CO2: 26 mmol/L (ref 22–32)
Calcium: 9 mg/dL (ref 8.9–10.3)
Chloride: 99 mmol/L (ref 98–111)
Creatinine, Ser: 1.43 mg/dL — ABNORMAL HIGH (ref 0.44–1.00)
GFR, Estimated: 38 mL/min — ABNORMAL LOW (ref >60)
Glucose, Bld: 178 mg/dL — ABNORMAL HIGH (ref 70–99)
Potassium: 3.7 mmol/L (ref 3.5–5.1)
Sodium: 132 mmol/L — ABNORMAL LOW (ref 135–145)

## 2023-09-24 MED ORDER — OXYCODONE HCL 5 MG PO TABS
5.0000 mg | ORAL_TABLET | ORAL | 0 refills | Status: DC | PRN
  Filled 2023-09-24: qty 42, 7d supply, fill #0

## 2023-09-24 MED ORDER — ASPIRIN 81 MG PO CHEW
81.0000 mg | CHEWABLE_TABLET | Freq: Two times a day (BID) | ORAL | 0 refills | Status: AC
  Filled 2023-09-24: qty 56, 28d supply, fill #0

## 2023-09-24 MED ORDER — METHOCARBAMOL 500 MG PO TABS
500.0000 mg | ORAL_TABLET | Freq: Four times a day (QID) | ORAL | 2 refills | Status: DC | PRN
  Filled 2023-09-24 – 2023-10-27 (×2): qty 40, 10d supply, fill #0

## 2023-09-24 MED ORDER — SODIUM CHLORIDE 0.9 % IV SOLN: INTRAVENOUS | Status: DC

## 2023-09-24 MED ORDER — SENNA 8.6 MG PO TABS
2.0000 | ORAL_TABLET | Freq: Every day | ORAL | 0 refills | Status: AC
Start: 2023-09-24 — End: 2023-10-08
  Filled 2023-09-24: qty 28, 14d supply, fill #0

## 2023-09-24 MED ORDER — SODIUM CHLORIDE 0.9 % IV BOLUS
500.0000 mL | Freq: Once | INTRAVENOUS | Status: AC
Start: 2023-09-24 — End: 2023-09-24
  Administered 2023-09-24: 500 mL via INTRAVENOUS

## 2023-09-24 MED ORDER — POLYETHYLENE GLYCOL 3350 17 GM/SCOOP PO POWD
17.0000 g | Freq: Two times a day (BID) | ORAL | 0 refills | Status: DC
  Filled 2023-09-24: qty 238, 7d supply, fill #0

## 2023-09-24 NOTE — Progress Notes (Signed)
   Subjective: 1 Day Post-Op Procedure(s) (LRB): ARTHROPLASTY, KNEE, TOTAL (Left) Patient reports pain as mild.   Patient seen in rounds by Dr. Charlann Boxer. Patient is well, and has had no acute complaints or problems. No acute events overnight. Foley catheter removed. Patient has not been up with PT yet.  We will start therapy today.   Objective: Vital signs in last 24 hours: Temp:  [97.5 F (36.4 C)-98.1 F (36.7 C)] 97.8 F (36.6 C) (04/16 0605) Pulse Rate:  [66-91] 66 (04/16 0605) Resp:  [9-22] 18 (04/16 0605) BP: (107-164)/(54-79) 136/66 (04/16 0605) SpO2:  [94 %-100 %] 99 % (04/16 0605) Weight:  [78.5 kg] 78.5 kg (04/15 1209)  Intake/Output from previous day:  Intake/Output Summary (Last 24 hours) at 09/24/2023 0802 Last data filed at 09/24/2023 0611 Gross per 24 hour  Intake 1834.5 ml  Output 2125 ml  Net -290.5 ml     Intake/Output this shift: No intake/output data recorded.  Labs: Recent Labs    09/24/23 0332  HGB 10.7*   Recent Labs    09/24/23 0332  WBC 12.8*  RBC 3.61*  HCT 33.8*  PLT 250   Recent Labs    09/24/23 0332  NA 132*  K 3.7  CL 99  CO2 26  BUN 28*  CREATININE 1.43*  GLUCOSE 178*  CALCIUM 9.0   No results for input(s): "LABPT", "INR" in the last 72 hours.  Exam: General - Patient is Alert and Oriented Extremity - Neurologically intact Sensation intact distally Intact pulses distally Dorsiflexion/Plantar flexion intact Dressing - dressing C/D/I Motor Function - intact, moving foot and toes well on exam.   Past Medical History:  Diagnosis Date   Allergy    spring seasonal   Arthritis    hip    Borderline diabetes 12/06/2016   Hyperlipidemia 10/21/2016   Hypertension    Pneumonia 2019   Preventative health care 02/19/2022    Assessment/Plan: 1 Day Post-Op Procedure(s) (LRB): ARTHROPLASTY, KNEE, TOTAL (Left) Principal Problem:   S/P total knee arthroplasty, left  Estimated body mass index is 27.11 kg/m as calculated  from the following:   Height as of this encounter: 5\' 7"  (1.702 m).   Weight as of this encounter: 78.5 kg. Advance diet Up with therapy D/C IV fluids   Patient's anticipated LOS is less than 2 midnights, meeting these requirements: - Younger than 108 - Lives within 1 hour of care - Has a competent adult at home to recover with post-op recover - NO history of  - Chronic pain requiring opiods  - Diabetes  - Coronary Artery Disease  - Heart failure  - Heart attack  - Stroke  - DVT/VTE  - Cardiac arrhythmia  - Respiratory Failure/COPD  - Renal failure  - Anemia  - Advanced Liver disease     DVT Prophylaxis - Aspirin Weight bearing as tolerated.  Hgb stable at 10.7 this AM. Cr 1.43 this AM , baseline 0.88 - will hold NSAIDs for now and add IVF   Plan is to go Home after hospital stay. Plan for discharge today following 1-2 sessions of PT as long as they are meeting their goals. Patient is scheduled for OPPT. Follow up in the office in 2 weeks.   Rosalene Billings, PA-C Orthopedic Surgery [phone] 09/24/2023, 8:02 AM

## 2023-09-24 NOTE — Care Management Obs Status (Signed)
 MEDICARE OBSERVATION STATUS NOTIFICATION   Patient Details  Name: Patricia Lee MRN: [id number] Date of Birth: 04/06/1946   Medicare Observation Status Notification Given:  Yes    [name], RN 09/24/2023, 9:31 AM

## 2023-09-24 NOTE — Plan of Care (Signed)
  Problem: Education: Goal: Knowledge of General Education information will improve Description: Including pain rating scale, medication(s)/side effects and non-pharmacologic comfort measures Outcome: Progressing   Problem: Safety: Goal: Ability to remain free from injury will improve Outcome: Progressing   Problem: Activity: Goal: Range of joint motion will improve Outcome: Progressing   Problem: Clinical Measurements: Goal: Postoperative complications will be avoided or minimized Outcome: Progressing   Problem: Pain Management: Goal: Pain level will decrease with appropriate interventions Outcome: Progressing

## 2023-09-24 NOTE — TOC Transition Note (Signed)
 Transition of Care [facility]) - Discharge Note   Patient Details  Name: Patricia Lee MRN: [id number] Date of Birth: Dec 24, 1945  Transition of Care [facility]) CM/SW Contact:  [name], RN Phone Number: 09/24/2023, 9:37 AM   Clinical Narrative:   Met with patient at bedside to review dc therapy and home equipment needs, pt confirmed OPPT at [facility], has RW, no home equipment needs. MOON explained and reviewed with patient, form signed, copy provided to patient. No TOC needs.     Final next level of care: OP Rehab Barriers to Discharge: No Barriers Identified   Patient Goals and CMS Choice Patient states their goals for this hospitalization and ongoing recovery are:: return home          Discharge Placement                       Discharge Plan and Services Additional resources added to the After Visit Summary for                  DME Arranged: N/A                    Social Drivers of Health (SDOH) Interventions SDOH Screenings   Food Insecurity: No Food Insecurity (09/23/2023)  Housing: Low Risk  (09/23/2023)  Transportation Needs: No Transportation Needs (09/23/2023)  Utilities: Not At Risk (09/23/2023)  Alcohol Screen: Low Risk  (06/17/2023)  Depression (PHQ2-9): Low Risk  (02/25/2023)  Financial Resource Strain: Low Risk  (06/17/2023)  Physical Activity: Sufficiently Active (06/17/2023)  Social Connections: Moderately Integrated (09/23/2023)  Stress: No Stress Concern Present (06/17/2023)  Tobacco Use: Medium Risk (09/23/2023)     Readmission Risk Interventions     No data to display

## 2023-09-24 NOTE — Evaluation (Signed)
 Physical Therapy Evaluation Patient Details Name: Patricia Lee MRN: [id number] DOB: June 07, 1946 Today's Date: 09/24/2023  History of Present Illness  78 yo female s/p LTKA. Hx R THA-DA 2017, chronic pain, PVCs.  Clinical Impression  On eval, pt was CGA for mobility. She walked ~120 feet with a RW. Pain rated 7/10 during session. Will plan to have a 2nd session prior to d/c later today.         If plan is discharge home, recommend the following: A little help with walking and/or transfers;A little help with bathing/dressing/bathroom;Assistance with cooking/housework;Assist for transportation;Help with stairs or ramp for entrance   Can travel by private vehicle        Equipment Recommendations None recommended by PT  Recommendations for Other Services       Functional Status Assessment Patient has had a recent decline in their functional status and demonstrates the ability to make significant improvements in function in a reasonable and predictable amount of time.     Precautions / Restrictions Precautions Precautions: Fall;Knee Restrictions Weight Bearing Restrictions Per Provider Order: No Other Position/Activity Restrictions: WBAT      Mobility  Bed Mobility Overal bed mobility: Needs Assistance Bed Mobility: Supine to Sit     Supine to sit: Supervision, HOB elevated     General bed mobility comments: Supv for safety, lines.    Transfers Overall transfer level: Needs assistance Equipment used: Rolling walker (2 wheels) Transfers: Sit to/from Stand Sit to Stand: Contact guard assist           General transfer comment: Cues for safety, technique, hand/LE placement.    Ambulation/Gait Ambulation/Gait assistance: Contact guard assist Gait Distance (Feet): 120 Feet Assistive device: Rolling walker (2 wheels) Gait Pattern/deviations: Step-through pattern, Decreased stride length       General Gait Details: Cues for sequencing. No LOB with RW use. Pt denied  dizziness.  Stairs            Wheelchair Mobility     Tilt Bed    Modified Rankin (Stroke Patients Only)       Balance Overall balance assessment: Needs assistance         Standing balance support: Bilateral upper extremity supported, During functional activity, Reliant on assistive device for balance Standing balance-Leahy Scale: Fair                               Pertinent Vitals/Pain Pain Assessment Pain Assessment: 0-10 Pain Score: 7  Pain Location: L thigh Pain Descriptors / Indicators: Aching, Operative site guarding Pain Intervention(s): Limited activity within patient's tolerance, Monitored during session, Repositioned, Ice applied    Home Living Family/patient expects to be discharged to:: Private residence Living Arrangements: Alone Available Help at Discharge: Family;Available PRN/intermittently Type of Home: House Home Access: Stairs to enter Entrance Stairs-Rails: None Entrance Stairs-Number of Steps: 1   Home Layout: One level Home Equipment: Agricultural consultant (2 wheels);Cane - quad;BSC/3in1      Prior Function Prior Level of Function : Independent/Modified Independent                     Extremity/Trunk Assessment   Upper Extremity Assessment Upper Extremity Assessment: Overall WFL for tasks assessed    Lower Extremity Assessment Lower Extremity Assessment: Generalized weakness    Cervical / Trunk Assessment Cervical / Trunk Assessment: Normal  Communication   Communication Communication: No apparent difficulties    Cognition Arousal: Alert Behavior During  Therapy: WFL for tasks assessed/performed   PT - Cognitive impairments: No apparent impairments                         Following commands: Intact       Cueing Cueing Techniques: Verbal cues     General Comments      Exercises Total Joint Exercises Ankle Circles/Pumps: AROM, Both, 10 reps Quad Sets: AROM, Both, 10 reps Hip  ABduction/ADduction: AROM, Left, 10 reps Straight Leg Raises: AROM, Left, 10 reps Knee Flexion: AROM, Left, 10 reps, Seated Goniometric ROM: ~5-70 degrees   Assessment/Plan    PT Assessment Patient needs continued PT services  PT Problem List Decreased strength;Decreased range of motion;Decreased activity tolerance;Decreased balance;Decreased mobility;Decreased knowledge of use of DME;Pain       PT Treatment Interventions DME instruction;Gait training;Stair training;Therapeutic activities;Therapeutic exercise;Functional mobility training;Patient/family education;Balance training    PT Goals (Current goals can be found in the Care Plan section)  Acute Rehab PT Goals Patient Stated Goal: home PT Goal Formulation: With patient/family Time For Goal Achievement: 10/08/23 Potential to Achieve Goals: Good    Frequency 7X/week     Co-evaluation               AM-PAC PT "6 Clicks" Mobility  Outcome Measure Help needed turning from your back to your side while in a flat bed without using bedrails?: None Help needed moving from lying on your back to sitting on the side of a flat bed without using bedrails?: None Help needed moving to and from a bed to a chair (including a wheelchair)?: A Little Help needed standing up from a chair using your arms (e.g., wheelchair or bedside chair)?: A Little Help needed to walk in hospital room?: A Little Help needed climbing 3-5 steps with a railing? : A Little 6 Click Score: 20    End of Session Equipment Utilized During Treatment: Gait belt Activity Tolerance: Patient tolerated treatment well Patient left: with family/visitor present;with call bell/phone within reach;in chair   PT Visit Diagnosis: Other abnormalities of gait and mobility (R26.89);Pain Pain - Right/Left: Left Pain - part of body: Knee    Time: 7829-5621 PT Time Calculation (min) (ACUTE ONLY): 21 min   Charges:   PT Evaluation $PT Eval Low Complexity: 1 Low   PT  General Charges $$ ACUTE PT VISIT: 1 Visit            Tanda Falter, PT Acute Rehabilitation  Office: [phone]

## 2023-09-24 NOTE — Progress Notes (Signed)
 Physical Therapy Treatment Patient Details Name: Patricia Lee MRN: [id number] DOB: 1945-12-01 Today's Date: 09/24/2023   History of Present Illness 78 yo female s/p LTKA. Hx R THA-DA 2017, chronic pain, PVCs.    PT Comments  Pt agreeable to working with therapy. Issued HEP for pt to follow at home until she begins OP PT. Encouraged pt to mobilize often as tolerated. Ok to d/c from PT standpoint/PT education completed.    If plan is discharge home, recommend the following: A little help with walking and/or transfers;A little help with bathing/dressing/bathroom;Assistance with cooking/housework;Assist for transportation;Help with stairs or ramp for entrance   Can travel by private vehicle        Equipment Recommendations  None recommended by PT    Recommendations for Other Services       Precautions / Restrictions Precautions Precautions: Fall;Knee Restrictions Weight Bearing Restrictions Per Provider Order: No Other Position/Activity Restrictions: WBAT     Mobility  Bed Mobility Overal bed mobility: Needs Assistance           General bed mobility comments: oob in recliner    Transfers Overall transfer level: Needs assistance Equipment used: Rolling walker (2 wheels) Transfers: Sit to/from Stand Sit to Stand: Contact guard assist           General transfer comment: Cues for safety, technique, hand/LE placement.    Ambulation/Gait Ambulation/Gait assistance: Contact guard assist Gait Distance (Feet): 100 Feet Assistive device: Rolling walker (2 wheels) Gait Pattern/deviations: Step-through pattern, Decreased stride length       General Gait Details: Cues for sequencing. No LOB with RW use. Pt denied dizziness.   Stairs Stairs: Yes Stairs assistance: Contact guard assist Stair Management: Step to pattern, Backwards, With walker Number of Stairs: 1 General stair comments: Pt only has small threshold step to enter home-verbally reviewed proper technique  for that (forwards with RW; "up with good/down with bad"). Pt did request to practice stepping in/out of walk-in shower. Practiced using RW and stepping in backwards.   Wheelchair Mobility     Tilt Bed    Modified Rankin (Stroke Patients Only)       Balance Overall balance assessment: Needs assistance         Standing balance support: Bilateral upper extremity supported, During functional activity, Reliant on assistive device for balance Standing balance-Leahy Scale: Fair                              Hotel manager: No apparent difficulties  Cognition Arousal: Alert Behavior During Therapy: WFL for tasks assessed/performed   PT - Cognitive impairments: No apparent impairments                         Following commands: Intact      Cueing Cueing Techniques: Verbal cues  Exercises     General Comments        Pertinent Vitals/Pain Pain Assessment Pain Assessment: Faces Pain Score: 7  Faces Pain Scale: Hurts even more Pain Location: L thigh Pain Descriptors / Indicators: Aching, Operative site guarding Pain Intervention(s): Limited activity within patient's tolerance, Monitored during session, Repositioned    Home Living                          Prior Function            PT Goals (current goals can now be found  in the care plan section) Acute Rehab PT Goals Patient Stated Goal: home PT Goal Formulation: With patient/family Time For Goal Achievement: 10/08/23 Potential to Achieve Goals: Good Progress towards PT goals: Progressing toward goals    Frequency    7X/week      PT Plan      Co-evaluation              AM-PAC PT "6 Clicks" Mobility   Outcome Measure  Help needed turning from your back to your side while in a flat bed without using bedrails?: None Help needed moving from lying on your back to sitting on the side of a flat bed without using bedrails?: None Help needed  moving to and from a bed to a chair (including a wheelchair)?: A Little Help needed standing up from a chair using your arms (e.g., wheelchair or bedside chair)?: A Little Help needed to walk in hospital room?: A Little Help needed climbing 3-5 steps with a railing? : A Little 6 Click Score: 20    End of Session Equipment Utilized During Treatment: Gait belt Activity Tolerance: Patient tolerated treatment well Patient left: in bed;with call bell/phone within reach;with family/visitor present   PT Visit Diagnosis: Other abnormalities of gait and mobility (R26.89);Pain Pain - Right/Left: Left Pain - part of body: Knee     Time: 1355-1410 PT Time Calculation (min) (ACUTE ONLY): 15 min  Charges:    $Gait Training: 8-22 mins PT General Charges $$ ACUTE PT VISIT: 1 Visit                        Tanda Falter, PT Acute Rehabilitation  Office: [phone]

## 2023-09-24 NOTE — Progress Notes (Signed)
 Discharge medications delivered to patient at bedside [facility]

## 2023-09-25 NOTE — Therapy (Addendum)
 OUTPATIENT PHYSICAL THERAPY LOWER EXTREMITY EVALUATION   Patient Name: Patricia Lee MRN: [id number] DOB:Jul 06, 1945, 78 y.o., female Today's Date: 09/29/2023  END OF SESSION:  PT End of Session - 09/29/23 1103     Visit Number 1    Date for PT Re-Evaluation 11/24/23    Authorization Type [employer]    Authorization Time Period AUTH Pending    Authorization - Visit Number 1    PT Start Time 1101    PT Stop Time 1205    PT Time Calculation (min) 64 min    Activity Tolerance Patient tolerated treatment well;Patient limited by pain    Behavior During Therapy [facility] for tasks assessed/performed             Past Medical History:  Diagnosis Date   Allergy    spring seasonal   Arthritis    hip    Borderline diabetes 12/06/2016   Hyperlipidemia 10/21/2016   Hypertension    Pneumonia 2019   Preventative health care 02/19/2022   Past Surgical History:  Procedure Laterality Date   COLONOSCOPY     JOINT REPLACEMENT  Right hip   POLYPECTOMY     TOTAL HIP ARTHROPLASTY Right 04/09/2016   Procedure: RIGHT TOTAL HIP ARTHROPLASTY ANTERIOR APPROACH;  Surgeon: Claiborne Crew, MD;  Location: [facility];  Service: Orthopedics;  Laterality: Right;   TOTAL KNEE ARTHROPLASTY Left 09/23/2023   Procedure: ARTHROPLASTY, KNEE, TOTAL;  Surgeon: Claiborne Crew, MD;  Location: [facility];  Service: Orthopedics;  Laterality: Left;   Patient Active Problem List   Diagnosis Date Noted   S/P total knee arthroplasty, left 09/23/2023   Pre-op evaluation 08/15/2023   Back pain 06/24/2023   Tinnitus 02/25/2023   COVID-19 01/07/2023   Hyponatremia 01/07/2023   Preventative health care 02/19/2022   Hyperglycemia 02/13/2021   Chronic left shoulder pain 02/13/2021   Borderline diabetes 12/06/2016   Hyperlipidemia 10/21/2016   Overweight (BMI 25.0-29.9) 04/10/2016   S/P right THA, 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   History of skin cancer 07/28/2012   HERPES ZOSTER 11/27/2008   PREMATURE VENTRICULAR CONTRACTIONS  11/17/2008   Essential hypertension 09/10/2007    PCP: Dorrene Gaucher, NP  REFERRING PROVIDER: Claiborne Crew, MD  REFERRING DIAG: [phone] (ICD-10-CM) - Unilateral primary osteoarthritis, left knee   THERAPY DIAG:  Muscle weakness (generalized)  Acute pain of left knee  Stiffness of left knee, not elsewhere classified  Other abnormalities of gait and mobility  Localized edema  RATIONALE FOR EVALUATION AND TREATMENT: Rehabilitation  ONSET DATE: 09/23/23  NEXT MD VISIT: 10/08/23   SUBJECTIVE:   SUBJECTIVE STATEMENT:  Had L knee replacement. When leg is straight for a period of time, she has more of a dull pain in posterior knee  PERTINENT HISTORY: Borderline diabetes, hip arthritis, HTN PAIN:  Are you having pain? Yes: NPRS scale: 5/10 currently, 9.5/10 worst Pain location: In knee, lateral and posterior Pain description: throbbing, occassionally sharp, most times achy Aggravating factors: bending knee, standing too long, turning over in bed, keeping legs straight Relieving factors: not moving  PRECAUTIONS: Knee  RED FLAGS: None   WEIGHT BEARING RESTRICTIONS: No  FALLS:  Has patient fallen in last 6 months? No  LIVING ENVIRONMENT: Lives with: lives alone Lives in: House/apartment Stairs: Yes: Internal: 1 steps; none Has following equipment at home: Single point cane, Walker - 2 wheeled, and elevated toilet seat  OCCUPATION: Retired  PLOF: Independent: [employer] 3x/wk, walk in neighborhood, [employer] trips, walk 2 blocks  PATIENT GOALS: Be  able to walk, swim, go to the beach, walk in sand w/o any pain, get back to normal life   OBJECTIVE:  Note: Objective measures were completed at Evaluation unless otherwise noted.  DIAGNOSTIC FINDINGS:   PATIENT SURVEYS:  LEFS 17 / 80 = 21.3 %  COGNITION: Overall cognitive status: Within functional limits for tasks assessed     SENSATION: WFL  EDEMA:  Circumferential:   R = 41.0 cm at 6 cm  above patella, 38.5 cm at mid-patella, 34.0 cm at 6 cm below patella L = 46.5 cm at 6 cm above patella, 44.0 cm at mid-patella, 38.5 cm at 6 cm below patella  MUSCLE LENGTH: Hamstrings: mod tight R, mild tight  ITB: mild tight R Piriformis: NT Hip flexors: mild tight R  POSTURE: rounded shoulders, forward head, and flexed trunk   PALPATION:  TTP and pain in L lateral knee & posterior knee/popliteal region  LOWER EXTREMITY ROM:  Active ROM Right eval Left eval  Hip flexion    Hip extension    Hip abduction    Hip adduction    Hip internal rotation    Hip external rotation    Knee flexion 110 81  Knee extension -9 in LAQ,      0 supported -22 in LAQ,   -21 supported  Ankle dorsiflexion 4 4-  Ankle plantarflexion    Ankle inversion    Ankle eversion     (Blank rows = not tested)  LOWER EXTREMITY MMT:  MMT Right eval Left eval  Hip flexion    Hip extension 4 4- (tested in S/L)  Hip abduction 4 3+  Hip adduction 4 4-  Hip internal rotation    Hip external rotation    Knee flexion 4 3+ ^ dull  Knee extension 4 3+^ dull  Ankle dorsiflexion 4 4-   (Blank rows = not tested) ^: pain                                                                                                                              TREATMENT DATE:    09/29/23 THERAPEUTIC EXERCISE: To improve strength, endurance, ROM, and flexibility.  Demonstration, verbal and tactile cues throughout for technique. Supine HS stretch w/ strap Seated Passive Extension Stretch Supine quad sets x 3 x 3-5"  MODALITIES: Game Ready vasopneumatic compression post session to L knee x 10 min, med compression, 34 to reduce post-exercise pain and swelling/edema  PATIENT EDUCATION:  Education details: anticipated POC, initial HEP, review hospital HEP Person educated: Patient Education method: Explanation, Demonstration, Tactile cues, Verbal cues, and Handouts Education comprehension: verbalized understanding, returned  demonstration, verbal cues required, tactile cues required, and needs further education  HOME EXERCISE PROGRAM: Access Code: [email or web address] URL: [email or web address] Date: 09/29/2023 Prepared by: Chakita Mcgraw Joseph-Greene  Exercises - Supine Hamstring Stretch with Strap (Mirrored)  - 2-3 x daily - 7 x weekly - 3 reps - 30 sec hold - Quad Setting and Stretching  -  2-3 x daily - 7 x weekly - 2 sets - 10 reps - 3-5 sec hold - Seated Passive Knee Extension  - 2-3 x daily - 7 x weekly - 3 reps - 30-60 sec hold  ASSESSMENT:  CLINICAL IMPRESSION: Patricia Lee is a 78 y.o. female who was seen today for physical therapy evaluation and treatment for L TKA on 09/23/23. She arrived to the clinic with a RW and demonstrated limited knee extension when walking. She reports 5/10 currently but gets up to 9.5/10 at worst. L knee flexion is pretty well, she's already at 81, however, she is lacking about 22 of L knee extension. She is lacking knee extension bilaterally, but more so in the L knee. She does have trouble sleeping at night, noting discomfort due to wearing the brace at night. Informed her that is okay to not wear the brace when sleeping. During the LE MMT, she showed deficits more so on the L side with a dull pain in knee flexion and extension. She mentions that her hips hurt when performing hospital HEP, but it could be due to her activating the muscles. On LEFS, she scored 17/80 demonstrating severe disability. After the examination, she mentioned an increased in pain; therefore, we ended session with vaso to alleviate pain and swelling. The knee stiffness, pain, and swelling is impacting Patricia Lee's ability to walk, participate in [facility] activities, and going to the beach. Patricia Lee will benefits from skilled PT to address above deficits to improve mobility and activity tolerance with decreased pain, swelling and stiffness.   OBJECTIVE IMPAIRMENTS: Abnormal gait, decreased activity tolerance, decreased balance,  decreased coordination, decreased endurance, decreased knowledge of condition, decreased knowledge of use of DME, decreased mobility, difficulty walking, decreased ROM, decreased strength, increased edema, increased fascial restrictions, impaired perceived functional ability, increased muscle spasms, impaired flexibility, impaired sensation, impaired tone, improper body mechanics, and pain.   ACTIVITY LIMITATIONS: lifting, bending, sitting, standing, squatting, sleeping, stairs, transfers, bed mobility, bathing, dressing, hygiene/grooming, and locomotion level  PARTICIPATION LIMITATIONS: meal prep, cleaning, laundry, driving, and community activity  PERSONAL FACTORS: Past/current experiences, Time since onset of injury/illness/exacerbation, and 3+ comorbidities: Borderline diabetes, hip arthritis, HTN  are also affecting patient's functional outcome.   REHAB POTENTIAL: Good  CLINICAL DECISION MAKING: Evolving/moderate complexity  EVALUATION COMPLEXITY: Moderate   GOALS: Goals reviewed with patient? Yes  SHORT TERM GOALS: Target date: 10/27/2023  1.  Pt will be independent with initial HEP Baseline:  Goal status: INITIAL  2.  Patient will report at least 25% improvement in L knee pain to improve QOL. Baseline: 5/10 currently, 9.5/10 worst Goal status: INITIAL  3.  Pt will report >/= 26/80 on LEFS (MCID = 9 pts) to demonstrate improved functional ability Baseline: 17 / 80 = 21.3 % Goal status: INITIAL  4.  Pt will have improve L knee ROM to -10 to 90 to normalize gait pattern  Baseline: Refer to ROM table Goal status: INITIAL  5.  Pt will be able to perform TUG <20 sec w/ AD  Baseline: NT Goal status: INITIAL   LONG TERM GOALS: Target date: 11/24/2023   1.  Pt will be able to perform TUG <=13.5 sec w/o AD Baseline:  Goal status: INITIAL  2.  Pt will demonstrate improved L LE strength to >/= 4+/5 for improved stability and ease of mobility. Baseline: Refer to MMT  table Goal status: INITIAL  3.  Pt will report >/= 35/80 on LEFS (MCID = 9 pts) to demonstrate improved functional ability Baseline:  17 / 80 = 21.3 % Goal status: INITIAL  4.  Pt will have improve L knee ROM to 0 to 110 to normalize gait pattern  Baseline:  Goal status: INITIAL  5.  Pt will be able to ambulate 600" w/o AD with normal gait pattern to return to community walking Baseline:  Goal status: INITIAL  6.  Pt will be able to sit or stand for 1hr w/o increased pain in L knee Baseline: Quite difficult to do  Goal status: INITIAL   PLAN:  PT FREQUENCY: 2-3x/week - 3x/wk x 2 weeks, tapering to 2x/week for duration of POC   PT DURATION: 8 weeks  PLANNED INTERVENTIONS: 97164- PT Re-evaluation, 97750- Physical Performance Testing, 97110-Therapeutic exercises, 97530- Therapeutic activity, [id number]- Neuromuscular re-education, 97535- Self Care, 78295- Manual therapy, [id number]- Gait training, [phone]- Aquatic Therapy, [phone]- Electrical stimulation (manual), 97016- Vasopneumatic device, [id number]- Ultrasound, 46962- Ionotophoresis 4mg /ml Dexamethasone , Patient/Family education, Balance training, Stair training, Taping, Dry Needling, Joint mobilization, Scar mobilization, Compression bandaging, DME instructions, Cryotherapy, and Moist heat  PLAN FOR NEXT SESSION: Review initial HEP, Perform TUG, improve flexibility & ROM, introduce joint mobs, introduce LE/core strengthening exercises    Jovonni Borquez Joseph-Greene, Student-PT 09/29/2023, 4:50 PM     Date of referral: 08/29/23 Referring provider: Claiborne Crew, MD Referring diagnosis? M17.12 (ICD-10-CM) - Unilateral primary osteoarthritis, left knee  Treatment diagnosis? (if different than referring diagnosis)  Muscle weakness (generalized)  Acute pain of left knee  Stiffness of left knee, not elsewhere classified  Other abnormalities of gait and mobility  Localized edema  What was this (referring dx) caused by? Surgery (Type: L  TKA)  Nature of Condition: Initial Onset (within last 3 months)   Laterality: Lt  Current Functional Measure Score: LEFS 17 / 80 = 21.3 %  Objective measurements identify impairments when they are compared to normal values, the uninvolved extremity, and prior level of function.  [x]  Yes  []  No  Objective assessment of functional ability: Moderate functional limitations   Briefly describe symptoms: L knee dull pain and swelling, limited and painful L knee flexion/extension ROM, B hip discomfort, B LE/core muscle weakness  How did symptoms start: After L TKA surgery on 09/23/23  Average pain intensity:  Last 24 hours: 6/10  Past week: 8/10  How often does the pt experience symptoms? Constantly  How much have the symptoms interfered with usual daily activities? Quite a bit  How has condition changed since care began at this facility? NA - initial visit  In general, how is the patients overall health? Very Good  Onset date: 09/23/23   BACK PAIN (STarT Back Screening Tool) - (When applicable):  Has your back pain spread down your leg(s) at sometime in the last 2 weeks? []  Yes   []  No Have you had pain in the shoulder or neck at sometime in the past 2 weeks? []  Yes   []  No Have you only walked short distances because of your back pain? []  Yes   []  No In the past 2 weeks, have you dressed more slowly than usual because of your back pain? []  Yes   []  No Do you think it is not really safe for person with a condition like yours to be physically active? []  Yes   []  No Have worrying thoughts been going through your mind a lot of the time? []  Yes   []  No Do you feel that your back pain is terrible and it is never going to get any better? []  Yes   []   No In general, have you stopped enjoying all the things you usually enjoy? []  Yes   []  No Overall, how bothersome has your back pain been in the last 2 weeks? []  Not at all   []  Slightly     []  Moderate   []  Very much     []   Extremely

## 2023-09-29 ENCOUNTER — Other Ambulatory Visit: Payer: Self-pay

## 2023-09-29 ENCOUNTER — Ambulatory Visit: Attending: Orthopedic Surgery | Admitting: Physical Therapy

## 2023-09-29 ENCOUNTER — Encounter: Payer: Self-pay | Admitting: Physical Therapy

## 2023-09-29 DIAGNOSIS — M25662 Stiffness of left knee, not elsewhere classified: Secondary | ICD-10-CM | POA: Insufficient documentation

## 2023-09-29 DIAGNOSIS — M6281 Muscle weakness (generalized): Secondary | ICD-10-CM | POA: Diagnosis not present

## 2023-09-29 DIAGNOSIS — M25562 Pain in left knee: Secondary | ICD-10-CM | POA: Insufficient documentation

## 2023-09-29 DIAGNOSIS — R2689 Other abnormalities of gait and mobility: Secondary | ICD-10-CM | POA: Diagnosis not present

## 2023-09-29 DIAGNOSIS — R600 Localized edema: Secondary | ICD-10-CM | POA: Insufficient documentation

## 2023-09-30 ENCOUNTER — Other Ambulatory Visit (HOSPITAL_BASED_OUTPATIENT_CLINIC_OR_DEPARTMENT_OTHER): Payer: Self-pay

## 2023-09-30 MED ORDER — OXYCODONE HCL 5 MG PO TABS
5.0000 mg | ORAL_TABLET | ORAL | 0 refills | Status: DC
  Filled 2023-09-30: qty 42, 7d supply, fill #0

## 2023-10-01 ENCOUNTER — Ambulatory Visit: Admitting: Physical Therapy

## 2023-10-01 ENCOUNTER — Encounter: Payer: Self-pay | Admitting: Physical Therapy

## 2023-10-01 DIAGNOSIS — R600 Localized edema: Secondary | ICD-10-CM

## 2023-10-01 DIAGNOSIS — M6281 Muscle weakness (generalized): Secondary | ICD-10-CM | POA: Diagnosis not present

## 2023-10-01 DIAGNOSIS — M25662 Stiffness of left knee, not elsewhere classified: Secondary | ICD-10-CM | POA: Diagnosis not present

## 2023-10-01 DIAGNOSIS — M25562 Pain in left knee: Secondary | ICD-10-CM

## 2023-10-01 DIAGNOSIS — R2689 Other abnormalities of gait and mobility: Secondary | ICD-10-CM

## 2023-10-01 NOTE — Therapy (Signed)
 OUTPATIENT PHYSICAL THERAPY LOWER EXTREMITY EVALUATION   Patient Name: Patricia Lee MRN: [id number] DOB:1945/10/17, 78 y.o., female Today's Date: 10/01/2023  END OF SESSION:  PT End of Session - 10/01/23 1103     Visit Number 2    Date for PT Re-Evaluation 11/24/23    Authorization Type [employer]    Authorization Time Period AUTH Pending    Authorization - Visit Number 2    PT Start Time 1059    PT Stop Time 1153    PT Time Calculation (min) 54 min    Activity Tolerance Patient tolerated treatment well    Behavior During Therapy [facility] for tasks assessed/performed              Past Medical History:  Diagnosis Date   Allergy    spring seasonal   Arthritis    hip    Borderline diabetes 12/06/2016   Hyperlipidemia 10/21/2016   Hypertension    Pneumonia 2019   Preventative health care 02/19/2022   Past Surgical History:  Procedure Laterality Date   COLONOSCOPY     JOINT REPLACEMENT  Right hip   POLYPECTOMY     TOTAL HIP ARTHROPLASTY Right 04/09/2016   Procedure: RIGHT TOTAL HIP ARTHROPLASTY ANTERIOR APPROACH;  Surgeon: Claiborne Crew, MD;  Location: [facility];  Service: Orthopedics;  Laterality: Right;   TOTAL KNEE ARTHROPLASTY Left 09/23/2023   Procedure: ARTHROPLASTY, KNEE, TOTAL;  Surgeon: Claiborne Crew, MD;  Location: [facility];  Service: Orthopedics;  Laterality: Left;   Patient Active Problem List   Diagnosis Date Noted   S/P total knee arthroplasty, left 09/23/2023   Pre-op evaluation 08/15/2023   Back pain 06/24/2023   Tinnitus 02/25/2023   COVID-19 01/07/2023   Hyponatremia 01/07/2023   Preventative health care 02/19/2022   Hyperglycemia 02/13/2021   Chronic left shoulder pain 02/13/2021   Borderline diabetes 12/06/2016   Hyperlipidemia 10/21/2016   Overweight (BMI 25.0-29.9) 04/10/2016   S/P right THA, 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   History of skin cancer 07/28/2012   HERPES ZOSTER 11/27/2008   PREMATURE VENTRICULAR CONTRACTIONS 11/17/2008   Essential  hypertension 09/10/2007    PCP: Dorrene Gaucher, NP  REFERRING PROVIDER: Claiborne Crew, MD  REFERRING DIAG: [phone] (ICD-10-CM) - Unilateral primary osteoarthritis, left knee   THERAPY DIAG:  Muscle weakness (generalized)  Acute pain of left knee  Stiffness of left knee, not elsewhere classified  Other abnormalities of gait and mobility  Localized edema  RATIONALE FOR EVALUATION AND TREATMENT: Rehabilitation  ONSET DATE: 09/23/23  NEXT MD VISIT: 10/08/23   SUBJECTIVE:   SUBJECTIVE STATEMENT:  Soreness in butt from HEP.  PERTINENT HISTORY: Borderline diabetes, hip arthritis, HTN PAIN:  Are you having pain? Yes: NPRS scale: 3/10 currently, 9.5/10 worst Pain location: In knee, lateral and posterior Pain description: throbbing, occassionally sharp, most times achy Aggravating factors: bending knee, standing too long, turning over in bed, keeping legs straight Relieving factors: not moving  PRECAUTIONS: Knee  RED FLAGS: None   WEIGHT BEARING RESTRICTIONS: No  FALLS:  Has patient fallen in last 6 months? No  LIVING ENVIRONMENT: Lives with: lives alone Lives in: House/apartment Stairs: Yes: Internal: 1 steps; none Has following equipment at home: Single point cane, Walker - 2 wheeled, and elevated toilet seat  OCCUPATION: Retired  PLOF: Independent: [employer] 3x/wk, walk in neighborhood, [employer] trips, walk 2 blocks  PATIENT GOALS: Be able to walk, swim, go to the beach, walk in sand w/o any pain, get back to normal life  OBJECTIVE:  Note: Objective measures were completed at Evaluation unless otherwise noted.  DIAGNOSTIC FINDINGS:   PATIENT SURVEYS:  LEFS 17 / 80 = 21.3 %  COGNITION: Overall cognitive status: Within functional limits for tasks assessed     SENSATION: WFL  EDEMA:  Circumferential:   R = 41.0 cm at 6 cm above patella, 38.5 cm at mid-patella, 34.0 cm at 6 cm below patella L = 46.5 cm at 6 cm above patella, 44.0 cm  at mid-patella, 38.5 cm at 6 cm below patella  MUSCLE LENGTH: Hamstrings: mod tight R, mild tight  ITB: mild tight R Piriformis: NT Hip flexors: mild tight R  POSTURE: rounded shoulders, forward head, and flexed trunk   PALPATION:  TTP and pain in L lateral knee & posterior knee/popliteal region  LOWER EXTREMITY ROM:  Active ROM Right eval Left eval  Hip flexion    Hip extension    Hip abduction    Hip adduction    Hip internal rotation    Hip external rotation    Knee flexion 110 81  Knee extension -9 in LAQ,      0 supported -22 in LAQ,   -21 supported  Ankle dorsiflexion 4 4-  Ankle plantarflexion    Ankle inversion    Ankle eversion     (Blank rows = not tested)  LOWER EXTREMITY MMT:  MMT Right eval Left eval  Hip flexion    Hip extension 4 4- (tested in S/L)  Hip abduction 4 3+  Hip adduction 4 4-  Hip internal rotation    Hip external rotation    Knee flexion 4 3+ ^ dull  Knee extension 4 3+^ dull  Ankle dorsiflexion 4 4-   (Blank rows = not tested) ^: pain  FUNCTIONAL TESTS:  Timed up and go (TUG): 18.5 sec with RW (10/01/23)                                                                                                                           TREATMENT DATE:    10/01/23 THERAPEUTIC EXERCISE: To improve strength, endurance, ROM, and flexibility.  Demonstration, verbal and tactile cues throughout for technique. NuStep L2 x 6 min Church pews w/ counter support 2 x 15 - cued not to bring hips fwd Seated LAQ x 10 B Supine heel slides w/ strap 2 x 10 Modified Thomas stretch L x 30" Standing gastroc stretch against wall x 30" B TUG w/ RW 18.5 sec   MODALITIES: Game Ready vasopneumatic compression post session to L knee x 10 min, med compression, 34 to reduce post-exercise pain and swelling/edema   09/29/23 THERAPEUTIC EXERCISE: To improve strength, endurance, ROM, and flexibility.  Demonstration, verbal and tactile cues throughout for technique. Supine  HS stretch w/ strap Seated Passive Extension Stretch Supine quad sets x 3 x 3-5"  MODALITIES: Game Ready vasopneumatic compression post session to L knee x 10 min, med compression, 34 to reduce post-exercise pain and swelling/edema  PATIENT EDUCATION:  Education details: initial HEP, review hospital HEP Person educated: Patient Education method: Explanation, Demonstration, Tactile cues, Verbal cues, and Handouts Education comprehension: verbalized understanding, returned demonstration, verbal cues required, tactile cues required, and needs further education  HOME EXERCISE PROGRAM: Access Code: [email or web address] URL: [email or web address] Date: 10/01/2023 Prepared by: Kirra Verga Joseph-Greene  Exercises - Supine Hamstring Stretch with Strap (Mirrored)  - 2-3 x daily - 7 x weekly - 3 reps - 30 sec hold - Quad Setting and Stretching  - 2-3 x daily - 7 x weekly - 2 sets - 10 reps - 3-5 sec hold - Seated Passive Knee Extension  - 2-3 x daily - 7 x weekly - 3 reps - 30-60 sec hold - Church Pew  - 1 x daily - 5-7 x weekly - 2 sets - 10 reps - Modified Thomas Stretch  - 1 x daily - 5-7 x weekly - 2 sets - 30 hold - Supine Heel Slide with Strap  - 1 x daily - 7 x weekly - 2 sets - 10 reps - Seated Long Arc Quad  - 1 x daily - 7 x weekly - 2 sets - 10 reps  ASSESSMENT:  CLINICAL IMPRESSION: Nailah returns to PT stating pull in posterior knee and 3/10 pain in knee. She also stated soreness in glutes due to activating them during the quad set exercises. She completed the TUG test with RW in 18.5 seconds which is her baseline. Because she reported a pull in her L posterior knee, wanted to introduce a gastroc stretch to help relieve tension in back of knee which she was able to tolerate. Included the HS stretch as well since HS and gastroc both cross behind the knee. Other than that, the main focus was to activate the quads to help improve knee extension since she showed limitation during the  initial evaluation. Introduced the church pew and supine heel slides to improve knee mobility, strengthen muscles around the knee and hip, and enhancing core stability and was able to tolerate exercises well. During the supine heel slides, she reported tension in L hip flexors; therefore, I added a modified hip flexor stretch to help alleviate muscle tension and she immediately felt the stretch, noting it helped. Ended the session with vasopneumatic device to alleviate pain and reduce edema in L knee. Labrea will benefit from continued skilled PT to address above deficits to improve mobility and activity tolerance with decreased pain, swelling and stiffness interference.   OBJECTIVE IMPAIRMENTS: Abnormal gait, decreased activity tolerance, decreased balance, decreased coordination, decreased endurance, decreased knowledge of condition, decreased knowledge of use of DME, decreased mobility, difficulty walking, decreased ROM, decreased strength, increased edema, increased fascial restrictions, impaired perceived functional ability, increased muscle spasms, impaired flexibility, impaired sensation, impaired tone, improper body mechanics, and pain.   ACTIVITY LIMITATIONS: lifting, bending, sitting, standing, squatting, sleeping, stairs, transfers, bed mobility, bathing, dressing, hygiene/grooming, and locomotion level  PARTICIPATION LIMITATIONS: meal prep, cleaning, laundry, driving, and community activity  PERSONAL FACTORS: Past/current experiences, Time since onset of injury/illness/exacerbation, and 3+ comorbidities: Borderline diabetes, hip arthritis, HTN  are also affecting patient's functional outcome.   REHAB POTENTIAL: Good  CLINICAL DECISION MAKING: Evolving/moderate complexity  EVALUATION COMPLEXITY: Moderate   GOALS: Goals reviewed with patient? Yes  SHORT TERM GOALS: Target date: 10/27/2023  1.  Pt will be independent with initial HEP Baseline:  Goal status: IN PROGRESS  2.  Patient  will report at least 25% improvement in L knee pain to improve QOL. Baseline:  5/10 currently, 9.5/10 worst Goal status: IN PROGRESS  3.  Pt will report >/= 26/80 on LEFS (MCID = 9 pts) to demonstrate improved functional ability Baseline: 17 / 80 = 21.3 % Goal status: IN PROGRESS  4.  Pt will have improve L knee ROM to -10 to 90 to normalize gait pattern  Baseline: Refer to ROM table Goal status: IN PROGRESS  5.  Pt will be able to perform TUG <20 sec w/ AD  Baseline: 18.5 sec w/ RW Goal status: IN PROGRESS   LONG TERM GOALS: Target date: 11/24/2023   1.  Pt will be able to perform TUG <=13.5 sec w/o AD Baseline: 18.5 sec w/ RW Goal status: IN PROGRESS  2.  Pt will demonstrate improved L LE strength to >/= 4+/5 for improved stability and ease of mobility. Baseline: Refer to MMT table Goal status: IN PROGRESS  3.  Pt will report >/= 35/80 on LEFS (MCID = 9 pts) to demonstrate improved functional ability Baseline: 17 / 80 = 21.3 % Goal status: IN PROGRESS  4.  Pt will have improve L knee ROM to 0 to 110 to normalize gait pattern  Baseline:  Goal status: IN PROGRESS  5.  Pt will be able to ambulate 600" w/o AD with normal gait pattern to return to community walking Baseline:  Goal status: IN PROGRESS  6.  Pt will be able to sit or stand for 1hr w/o increased pain in L knee Baseline: Quite difficult to do  Goal status: IN PROGRESS   PLAN:  PT FREQUENCY: 2-3x/week - 3x/wk x 2 weeks, tapering to 2x/week for duration of POC   PT DURATION: 8 weeks  PLANNED INTERVENTIONS: 97164- PT Re-evaluation, 97750- Physical Performance Testing, 97110-Therapeutic exercises, 97530- Therapeutic activity, [id number]- Neuromuscular re-education, 97535- Self Care, [id number]- Manual therapy, [id number]- Gait training, [phone]- Aquatic Therapy, [phone]- Electrical stimulation (manual), 97016- Vasopneumatic device, 97035- Ultrasound, 21308- Ionotophoresis 4mg /ml Dexamethasone , Patient/Family education, Balance  training, Stair training, Taping, Dry Needling, Joint mobilization, Scar mobilization, Compression bandaging, DME instructions, Cryotherapy, and Moist heat  PLAN FOR NEXT SESSION: Progress initial HEP, progress flexibility & ROM, introduce joint mobs, progress LE/core strengthening exercises    Demaris Leavell Joseph-Greene, Student-PT 10/01/2023, 12:01 PM     Date of referral: 08/29/23 Referring provider: Claiborne Crew, MD Referring diagnosis? M17.12 (ICD-10-CM) - Unilateral primary osteoarthritis, left knee  Treatment diagnosis? (if different than referring diagnosis)  Muscle weakness (generalized)  Acute pain of left knee  Stiffness of left knee, not elsewhere classified  Other abnormalities of gait and mobility  Localized edema  What was this (referring dx) caused by? Surgery (Type: L TKA)  Nature of Condition: Initial Onset (within last 3 months)   Laterality: Lt  Current Functional Measure Score: LEFS 17 / 80 = 21.3 %  Objective measurements identify impairments when they are compared to normal values, the uninvolved extremity, and prior level of function.  [x]  Yes  []  No  Objective assessment of functional ability: Moderate functional limitations   Briefly describe symptoms: L knee dull pain and swelling, limited and painful L knee flexion/extension ROM, B hip discomfort, B LE/core muscle weakness  How did symptoms start: After L TKA surgery on 09/23/23  Average pain intensity:  Last 24 hours: 6/10  Past week: 8/10  How often does the pt experience symptoms? Constantly  How much have the symptoms interfered with usual daily activities? Quite a bit  How has condition changed since care began at this facility? NA - initial visit  In general, how is the patients overall health? Very Good  Onset date: 09/23/23   BACK PAIN (STarT Back Screening Tool) - (When applicable):  Has your back pain spread down your leg(s) at sometime in the last 2 weeks? []  Yes   []  No Have  you had pain in the shoulder or neck at sometime in the past 2 weeks? []  Yes   []  No Have you only walked short distances because of your back pain? []  Yes   []  No In the past 2 weeks, have you dressed more slowly than usual because of your back pain? []  Yes   []  No Do you think it is not really safe for person with a condition like yours to be physically active? []  Yes   []  No Have worrying thoughts been going through your mind a lot of the time? []  Yes   []  No Do you feel that your back pain is terrible and it is never going to get any better? []  Yes   []  No In general, have you stopped enjoying all the things you usually enjoy? []  Yes   []  No Overall, how bothersome has your back pain been in the last 2 weeks? []  Not at all   []  Slightly     []  Moderate   []  Very much     []  Extremely

## 2023-10-03 ENCOUNTER — Ambulatory Visit

## 2023-10-03 DIAGNOSIS — M25662 Stiffness of left knee, not elsewhere classified: Secondary | ICD-10-CM

## 2023-10-03 DIAGNOSIS — R2689 Other abnormalities of gait and mobility: Secondary | ICD-10-CM

## 2023-10-03 DIAGNOSIS — R600 Localized edema: Secondary | ICD-10-CM

## 2023-10-03 DIAGNOSIS — M25562 Pain in left knee: Secondary | ICD-10-CM

## 2023-10-03 DIAGNOSIS — M6281 Muscle weakness (generalized): Secondary | ICD-10-CM | POA: Diagnosis not present

## 2023-10-03 NOTE — Therapy (Signed)
 OUTPATIENT PHYSICAL THERAPY LOWER EXTREMITY TREATMENT   Patient Name: Patricia Lee MRN: [id number] DOB:1945/12/01, 78 y.o., female Today's Date: 10/03/2023  END OF SESSION:  PT End of Session - 10/03/23 1146     Visit Number 3    Date for PT Re-Evaluation 11/24/23    Authorization Type [employer]    Authorization Time Period 09/20/23-11/24/23    Authorization - Visit Number 3    Authorization - Number of Visits 14    PT Start Time 1102    PT Stop Time 1155    PT Time Calculation (min) 53 min    Activity Tolerance Patient tolerated treatment well    Behavior During Therapy [facility] for tasks assessed/performed               Past Medical History:  Diagnosis Date   Allergy    spring seasonal   Arthritis    hip    Borderline diabetes 12/06/2016   Hyperlipidemia 10/21/2016   Hypertension    Pneumonia 2019   Preventative health care 02/19/2022   Past Surgical History:  Procedure Laterality Date   COLONOSCOPY     JOINT REPLACEMENT  Right hip   POLYPECTOMY     TOTAL HIP ARTHROPLASTY Right 04/09/2016   Procedure: RIGHT TOTAL HIP ARTHROPLASTY ANTERIOR APPROACH;  Surgeon: Claiborne Crew, MD;  Location: [facility];  Service: Orthopedics;  Laterality: Right;   TOTAL KNEE ARTHROPLASTY Left 09/23/2023   Procedure: ARTHROPLASTY, KNEE, TOTAL;  Surgeon: Claiborne Crew, MD;  Location: [facility];  Service: Orthopedics;  Laterality: Left;   Patient Active Problem List   Diagnosis Date Noted   S/P total knee arthroplasty, left 09/23/2023   Pre-op evaluation 08/15/2023   Back pain 06/24/2023   Tinnitus 02/25/2023   COVID-19 01/07/2023   Hyponatremia 01/07/2023   Preventative health care 02/19/2022   Hyperglycemia 02/13/2021   Chronic left shoulder pain 02/13/2021   Borderline diabetes 12/06/2016   Hyperlipidemia 10/21/2016   Overweight (BMI 25.0-29.9) 04/10/2016   S/P right THA, 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   History of skin cancer 07/28/2012   HERPES ZOSTER 11/27/2008   PREMATURE  VENTRICULAR CONTRACTIONS 11/17/2008   Essential hypertension 09/10/2007    PCP: Dorrene Gaucher, NP  REFERRING PROVIDER: Claiborne Crew, MD  REFERRING DIAG: [phone] (ICD-10-CM) - Unilateral primary osteoarthritis, left knee   THERAPY DIAG:  Muscle weakness (generalized)  Acute pain of left knee  Stiffness of left knee, not elsewhere classified  Other abnormalities of gait and mobility  Localized edema  RATIONALE FOR EVALUATION AND TREATMENT: Rehabilitation  ONSET DATE: 09/23/23  NEXT MD VISIT: 10/08/23   SUBJECTIVE:   SUBJECTIVE STATEMENT:  Pt reports some stiffness and swelling but no pain.  PERTINENT HISTORY: Borderline diabetes, hip arthritis, HTN PAIN:  Are you having pain? Yes: NPRS scale: 0/10 currently, 9.5/10 worst Pain location: In knee, lateral and posterior Pain description: throbbing, occassionally sharp, most times achy Aggravating factors: bending knee, standing too long, turning over in bed, keeping legs straight Relieving factors: not moving  PRECAUTIONS: Knee  RED FLAGS: None   WEIGHT BEARING RESTRICTIONS: No  FALLS:  Has patient fallen in last 6 months? No  LIVING ENVIRONMENT: Lives with: lives alone Lives in: House/apartment Stairs: Yes: Internal: 1 steps; none Has following equipment at home: Single point cane, Walker - 2 wheeled, and elevated toilet seat  OCCUPATION: Retired  PLOF: Independent: [employer] 3x/wk, walk in neighborhood, [employer] trips, walk 2 blocks  PATIENT GOALS: Be able to walk, swim, go to the  beach, walk in sand w/o any pain, get back to normal life   OBJECTIVE:  Note: Objective measures were completed at Evaluation unless otherwise noted.  DIAGNOSTIC FINDINGS:   PATIENT SURVEYS:  LEFS 17 / 80 = 21.3 %  COGNITION: Overall cognitive status: Within functional limits for tasks assessed     SENSATION: WFL  EDEMA:  Circumferential:   R = 41.0 cm at 6 cm above patella, 38.5 cm at mid-patella,  34.0 cm at 6 cm below patella L = 46.5 cm at 6 cm above patella, 44.0 cm at mid-patella, 38.5 cm at 6 cm below patella  MUSCLE LENGTH: Hamstrings: mod tight R, mild tight  ITB: mild tight R Piriformis: NT Hip flexors: mild tight R  POSTURE: rounded shoulders, forward head, and flexed trunk   PALPATION:  TTP and pain in L lateral knee & posterior knee/popliteal region  LOWER EXTREMITY ROM:  Active ROM Right eval Left eval L 10/03/23 Supine AROM  Hip flexion     Hip extension     Hip abduction     Hip adduction     Hip internal rotation     Hip external rotation     Knee flexion 110 81 85  Knee extension -9 in LAQ,      0 supported -22 in LAQ,   -21 supported 13  Ankle dorsiflexion 4 4-   Ankle plantarflexion     Ankle inversion     Ankle eversion      (Blank rows = not tested)  LOWER EXTREMITY MMT:  MMT Right eval Left eval  Hip flexion    Hip extension 4 4- (tested in S/L)  Hip abduction 4 3+  Hip adduction 4 4-  Hip internal rotation    Hip external rotation    Knee flexion 4 3+ ^ dull  Knee extension 4 3+^ dull  Ankle dorsiflexion 4 4-   (Blank rows = not tested) ^: pain  FUNCTIONAL TESTS:  Timed up and go (TUG): 18.5 sec with RW (10/01/23)                                                                                                                           TREATMENT DATE:   10/03/23 THERAPEUTIC EXERCISE: To improve strength, endurance, ROM, and flexibility.  Demonstration, verbal and tactile cues throughout for technique. Bike x partial rev Seated L LAQ 2 x 10  Seated L heel slide 2 x 10  Seated L gastroc + HS stretch with strap 2x30" L runner stretch 2x30" walker support Standing heel raises with walker support 2x10 Standing marching with walker x 10 B Supine l QS with towel behind knee 10x5" Supine L SLR + QS x 10 Heel slides with peanut ball and strap 2x10 Supine gastroc stretch on peanut ball 2x30" MODALITIES: Game Ready vasopneumatic  compression post session to L knee x 10 min, med compression, 34 to reduce post-exercise pain and swelling/edema  10/01/23 THERAPEUTIC EXERCISE: To improve  strength, endurance, ROM, and flexibility.  Demonstration, verbal and tactile cues throughout for technique. NuStep L2 x 6 min Church pews w/ counter support 2 x 15 - cued not to bring hips fwd Seated LAQ x 10 B Supine heel slides w/ strap 2 x 10 Modified Thomas stretch L x 30" Standing gastroc stretch against wall x 30" B TUG w/ RW 18.5 sec   MODALITIES: Game Ready vasopneumatic compression post session to L knee x 10 min, med compression, 34 to reduce post-exercise pain and swelling/edema   09/29/23 THERAPEUTIC EXERCISE: To improve strength, endurance, ROM, and flexibility.  Demonstration, verbal and tactile cues throughout for technique. Supine HS stretch w/ strap Seated Passive Extension Stretch Supine quad sets x 3 x 3-5"  MODALITIES: Game Ready vasopneumatic compression post session to L knee x 10 min, med compression, 34 to reduce post-exercise pain and swelling/edema  PATIENT EDUCATION:  Education details: initial HEP, review hospital HEP Person educated: Patient Education method: Explanation, Demonstration, Tactile cues, Verbal cues, and Handouts Education comprehension: verbalized understanding, returned demonstration, verbal cues required, tactile cues required, and needs further education  HOME EXERCISE PROGRAM: Access Code: [email or web address] URL: [email or web address] Date: 10/01/2023 Prepared by: Talia Joseph-Greene  Exercises - Supine Hamstring Stretch with Strap (Mirrored)  - 2-3 x daily - 7 x weekly - 3 reps - 30 sec hold - Quad Setting and Stretching  - 2-3 x daily - 7 x weekly - 2 sets - 10 reps - 3-5 sec hold - Seated Passive Knee Extension  - 2-3 x daily - 7 x weekly - 3 reps - 30-60 sec hold - Church Pew  - 1 x daily - 5-7 x weekly - 2 sets - 10 reps - Modified Thomas Stretch  - 1 x daily - 5-7 x  weekly - 2 sets - 30 hold - Supine Heel Slide with Strap  - 1 x daily - 7 x weekly - 2 sets - 10 reps - Seated Long Arc Quad  - 1 x daily - 7 x weekly - 2 sets - 10 reps  ASSESSMENT:  CLINICAL IMPRESSION: Progressed through exercises to improve knee ROM and strength. Good tolerance for exercises, cues to avoid hip hiking with marching. Instruction given on the importance of longer duration hold for HEP stretches. Improvements made in knee AROM today. Ended the session with vasopneumatic device to alleviate pain and reduce edema in L knee. Jamielyn will benefit from continued skilled PT to address above deficits to improve mobility and activity tolerance with decreased pain, swelling and stiffness interference.   OBJECTIVE IMPAIRMENTS: Abnormal gait, decreased activity tolerance, decreased balance, decreased coordination, decreased endurance, decreased knowledge of condition, decreased knowledge of use of DME, decreased mobility, difficulty walking, decreased ROM, decreased strength, increased edema, increased fascial restrictions, impaired perceived functional ability, increased muscle spasms, impaired flexibility, impaired sensation, impaired tone, improper body mechanics, and pain.   ACTIVITY LIMITATIONS: lifting, bending, sitting, standing, squatting, sleeping, stairs, transfers, bed mobility, bathing, dressing, hygiene/grooming, and locomotion level  PARTICIPATION LIMITATIONS: meal prep, cleaning, laundry, driving, and community activity  PERSONAL FACTORS: Past/current experiences, Time since onset of injury/illness/exacerbation, and 3+ comorbidities: Borderline diabetes, hip arthritis, HTN  are also affecting patient's functional outcome.   REHAB POTENTIAL: Good  CLINICAL DECISION MAKING: Evolving/moderate complexity  EVALUATION COMPLEXITY: Moderate   GOALS: Goals reviewed with patient? Yes  SHORT TERM GOALS: Target date: 10/27/2023  1.  Pt will be independent with initial  HEP Baseline:  Goal status: IN PROGRESS  2.  Patient will  report at least 25% improvement in L knee pain to improve QOL. Baseline: 5/10 currently, 9.5/10 worst Goal status: IN PROGRESS  3.  Pt will report >/= 26/80 on LEFS (MCID = 9 pts) to demonstrate improved functional ability Baseline: 17 / 80 = 21.3 % Goal status: IN PROGRESS  4.  Pt will have improve L knee ROM to -10 to 90 to normalize gait pattern  Baseline: Refer to ROM table Goal status: IN PROGRESS  5.  Pt will be able to perform TUG <20 sec w/ AD  Baseline: 18.5 sec w/ RW Goal status: IN PROGRESS   LONG TERM GOALS: Target date: 11/24/2023   1.  Pt will be able to perform TUG <=13.5 sec w/o AD Baseline: 18.5 sec w/ RW Goal status: IN PROGRESS  2.  Pt will demonstrate improved L LE strength to >/= 4+/5 for improved stability and ease of mobility. Baseline: Refer to MMT table Goal status: IN PROGRESS  3.  Pt will report >/= 35/80 on LEFS (MCID = 9 pts) to demonstrate improved functional ability Baseline: 17 / 80 = 21.3 % Goal status: IN PROGRESS  4.  Pt will have improve L knee ROM to 0 to 110 to normalize gait pattern  Baseline:  Goal status: IN PROGRESS  5.  Pt will be able to ambulate 600" w/o AD with normal gait pattern to return to community walking Baseline:  Goal status: IN PROGRESS  6.  Pt will be able to sit or stand for 1hr w/o increased pain in L knee Baseline: Quite difficult to do  Goal status: IN PROGRESS   PLAN:  PT FREQUENCY: 2-3x/week - 3x/wk x 2 weeks, tapering to 2x/week for duration of POC   PT DURATION: 8 weeks  PLANNED INTERVENTIONS: 97164- PT Re-evaluation, 97750- Physical Performance Testing, 97110-Therapeutic exercises, 97530- Therapeutic activity, [id number]- Neuromuscular re-education, 97535- Self Care, [id number]- Manual therapy, [id number]- Gait training, [phone]- Aquatic Therapy, [phone]- Electrical stimulation (manual), 97016- Vasopneumatic device, [id number]- Ultrasound, 65784- Ionotophoresis  4mg /ml Dexamethasone , Patient/Family education, Balance training, Stair training, Taping, Dry Needling, Joint mobilization, Scar mobilization, Compression bandaging, DME instructions, Cryotherapy, and Moist heat  PLAN FOR NEXT SESSION: Progress initial HEP, progress flexibility & ROM, introduce joint mobs, progress LE/core strengthening exercises    Hill Mackie L Sariya Trickey, PTA 10/03/2023, 11:51 AM     Date of referral: 08/29/23 Referring provider: Claiborne Crew, MD Referring diagnosis? M17.12 (ICD-10-CM) - Unilateral primary osteoarthritis, left knee  Treatment diagnosis? (if different than referring diagnosis)  Muscle weakness (generalized)  Acute pain of left knee  Stiffness of left knee, not elsewhere classified  Other abnormalities of gait and mobility  Localized edema  What was this (referring dx) caused by? Surgery (Type: L TKA)  Nature of Condition: Initial Onset (within last 3 months)   Laterality: Lt  Current Functional Measure Score: LEFS 17 / 80 = 21.3 %  Objective measurements identify impairments when they are compared to normal values, the uninvolved extremity, and prior level of function.  [x]  Yes  []  No  Objective assessment of functional ability: Moderate functional limitations   Briefly describe symptoms: L knee dull pain and swelling, limited and painful L knee flexion/extension ROM, B hip discomfort, B LE/core muscle weakness  How did symptoms start: After L TKA surgery on 09/23/23  Average pain intensity:  Last 24 hours: 6/10  Past week: 8/10  How often does the pt experience symptoms? Constantly  How much have the symptoms interfered with usual daily activities? Quite a bit  How  has condition changed since care began at this facility? NA - initial visit  In general, how is the patients overall health? Very Good  Onset date: 09/23/23   BACK PAIN (STarT Back Screening Tool) - (When applicable):  Has your back pain spread down your leg(s) at  sometime in the last 2 weeks? []  Yes   []  No Have you had pain in the shoulder or neck at sometime in the past 2 weeks? []  Yes   []  No Have you only walked short distances because of your back pain? []  Yes   []  No In the past 2 weeks, have you dressed more slowly than usual because of your back pain? []  Yes   []  No Do you think it is not really safe for person with a condition like yours to be physically active? []  Yes   []  No Have worrying thoughts been going through your mind a lot of the time? []  Yes   []  No Do you feel that your back pain is terrible and it is never going to get any better? []  Yes   []  No In general, have you stopped enjoying all the things you usually enjoy? []  Yes   []  No Overall, how bothersome has your back pain been in the last 2 weeks? []  Not at all   []  Slightly     []  Moderate   []  Very much     []  Extremely

## 2023-10-05 ENCOUNTER — Encounter: Payer: Self-pay | Admitting: Family

## 2023-10-05 DIAGNOSIS — N289 Disorder of kidney and ureter, unspecified: Secondary | ICD-10-CM

## 2023-10-06 ENCOUNTER — Ambulatory Visit

## 2023-10-06 DIAGNOSIS — R2689 Other abnormalities of gait and mobility: Secondary | ICD-10-CM | POA: Diagnosis not present

## 2023-10-06 DIAGNOSIS — M6281 Muscle weakness (generalized): Secondary | ICD-10-CM | POA: Diagnosis not present

## 2023-10-06 DIAGNOSIS — M25662 Stiffness of left knee, not elsewhere classified: Secondary | ICD-10-CM | POA: Diagnosis not present

## 2023-10-06 DIAGNOSIS — M25562 Pain in left knee: Secondary | ICD-10-CM

## 2023-10-06 DIAGNOSIS — R600 Localized edema: Secondary | ICD-10-CM | POA: Diagnosis not present

## 2023-10-06 NOTE — Therapy (Signed)
 OUTPATIENT PHYSICAL THERAPY LOWER EXTREMITY TREATMENT   Patient Name: Patricia Lee MRN: [id number] DOB:12-18-1945, 78 y.o., female Today's Date: 10/06/2023  END OF SESSION:  PT End of Session - 10/06/23 1125     Visit Number 4    Date for PT Re-Evaluation 11/24/23    Authorization Type [employer]    Authorization Time Period 09/20/23-11/24/23    Authorization - Visit Number 4    Authorization - Number of Visits 14    PT Start Time 1100    PT Stop Time 1155    PT Time Calculation (min) 55 min    Activity Tolerance Patient tolerated treatment well    Behavior During Therapy [facility] for tasks assessed/performed                Past Medical History:  Diagnosis Date   Allergy    spring seasonal   Arthritis    hip    Borderline diabetes 12/06/2016   Hyperlipidemia 10/21/2016   Hypertension    Pneumonia 2019   Preventative health care 02/19/2022   Past Surgical History:  Procedure Laterality Date   COLONOSCOPY     JOINT REPLACEMENT  Right hip   POLYPECTOMY     TOTAL HIP ARTHROPLASTY Right 04/09/2016   Procedure: RIGHT TOTAL HIP ARTHROPLASTY ANTERIOR APPROACH;  Surgeon: Claiborne Crew, MD;  Location: [facility];  Service: Orthopedics;  Laterality: Right;   TOTAL KNEE ARTHROPLASTY Left 09/23/2023   Procedure: ARTHROPLASTY, KNEE, TOTAL;  Surgeon: Claiborne Crew, MD;  Location: [facility];  Service: Orthopedics;  Laterality: Left;   Patient Active Problem List   Diagnosis Date Noted   S/P total knee arthroplasty, left 09/23/2023   Pre-op evaluation 08/15/2023   Back pain 06/24/2023   Tinnitus 02/25/2023   COVID-19 01/07/2023   Hyponatremia 01/07/2023   Preventative health care 02/19/2022   Hyperglycemia 02/13/2021   Chronic left shoulder pain 02/13/2021   Borderline diabetes 12/06/2016   Hyperlipidemia 10/21/2016   Overweight (BMI 25.0-29.9) 04/10/2016   S/P right THA, 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   History of skin cancer 07/28/2012   HERPES ZOSTER 11/27/2008   PREMATURE  VENTRICULAR CONTRACTIONS 11/17/2008   Essential hypertension 09/10/2007    PCP: Dorrene Gaucher, NP  REFERRING PROVIDER: Claiborne Crew, MD  REFERRING DIAG: [phone] (ICD-10-CM) - Unilateral primary osteoarthritis, left knee   THERAPY DIAG:  Muscle weakness (generalized)  Acute pain of left knee  Stiffness of left knee, not elsewhere classified  Other abnormalities of gait and mobility  Localized edema  RATIONALE FOR EVALUATION AND TREATMENT: Rehabilitation  ONSET DATE: 09/23/23  NEXT MD VISIT: 10/08/23   SUBJECTIVE:   SUBJECTIVE STATEMENT:  Pt was sore from last visit.   PERTINENT HISTORY: Borderline diabetes, hip arthritis, HTN PAIN:  Are you having pain? Yes: NPRS scale: 5/10 worst Pain location: In knee, lateral and posterior Pain description: throbbing, occassionally sharp, most times achy Aggravating factors: bending knee, standing too long, turning over in bed, keeping legs straight Relieving factors: not moving  PRECAUTIONS: Knee  RED FLAGS: None   WEIGHT BEARING RESTRICTIONS: No  FALLS:  Has patient fallen in last 6 months? No  LIVING ENVIRONMENT: Lives with: lives alone Lives in: House/apartment Stairs: Yes: Internal: 1 steps; none Has following equipment at home: Single point cane, Walker - 2 wheeled, and elevated toilet seat  OCCUPATION: Retired  PLOF: Independent: [employer] 3x/wk, walk in neighborhood, [employer] trips, walk 2 blocks  PATIENT GOALS: Be able to walk, swim, go to [employer], walk in  sand w/o any pain, get back to normal life   OBJECTIVE:  Note: Objective measures were completed at Evaluation unless otherwise noted.  DIAGNOSTIC FINDINGS:   PATIENT SURVEYS:  LEFS 17 / 80 = 21.3 %  COGNITION: Overall cognitive status: Within functional limits for tasks assessed     SENSATION: WFL  EDEMA:  Circumferential:   R = 41.0 cm at 6 cm above patella, 38.5 cm at mid-patella, 34.0 cm at 6 cm below patella L = 46.5  cm at 6 cm above patella, 44.0 cm at mid-patella, 38.5 cm at 6 cm below patella  MUSCLE LENGTH: Hamstrings: mod tight R, mild tight  ITB: mild tight R Piriformis: NT Hip flexors: mild tight R  POSTURE: rounded shoulders, forward head, and flexed trunk   PALPATION:  TTP and pain in L lateral knee & posterior knee/popliteal region  LOWER EXTREMITY ROM:  Active ROM Right eval Left eval L 10/03/23 Supine AROM L 10/06/23 Supine AROM  Hip flexion      Hip extension      Hip abduction      Hip adduction      Hip internal rotation      Hip external rotation      Knee flexion 110 81 85 102  Knee extension -9 in LAQ,      0 supported -22 in LAQ,   -21 supported 13 9  Ankle dorsiflexion 4 4-    Ankle plantarflexion      Ankle inversion      Ankle eversion       (Blank rows = not tested)  LOWER EXTREMITY MMT:  MMT Right eval Left eval  Hip flexion    Hip extension 4 4- (tested in S/L)  Hip abduction 4 3+  Hip adduction 4 4-  Hip internal rotation    Hip external rotation    Knee flexion 4 3+ ^ dull  Knee extension 4 3+^ dull  Ankle dorsiflexion 4 4-   (Blank rows = not tested) ^: pain  FUNCTIONAL TESTS:  Timed up and go (TUG): 18.5 sec with RW (10/01/23)                                                                                                                           TREATMENT DATE:   10/06/23 Gait training: with SPC cues for heel strike and proper gait sequence  THERAPEUTIC EXERCISE: To improve strength, endurance, ROM, and flexibility.  Demonstration, verbal and tactile cues throughout for technique. Nustep L5x7min Seated L heel slides 2x10 AROM Seated L LAQ 2x10 Seated ball squeeze 2x10 Seated PF with black TB 2x10 Supine L quad stretch with strap 2x30" Supine heel slide feet on peanut ball with strap 2x10 Supine gastroc stretch with heel propped on peanut ball 2x30" Supine SLR + QS x 10 Supine knee flexion and extension PROM Game Ready vasopneumatic  compression post session to L knee x 10 min, med compression, 34 to reduce post-exercise pain and  swelling/edema  10/03/23 THERAPEUTIC EXERCISE: To improve strength, endurance, ROM, and flexibility.  Demonstration, verbal and tactile cues throughout for technique. Bike x partial rev Seated L LAQ 2 x 10  Seated L heel slide 2 x 10  Seated L gastroc + HS stretch with strap 2x30" L runner stretch 2x30" walker support Standing heel raises with walker support 2x10 Standing marching with walker x 10 B Supine l QS with towel behind knee 10x5" Supine L SLR + QS x 10 Heel slides with peanut ball and strap 2x10 Supine gastroc stretch on peanut ball 2x30" MODALITIES: Game Ready vasopneumatic compression post session to L knee x 10 min, med compression, 34 to reduce post-exercise pain and swelling/edema  10/01/23 THERAPEUTIC EXERCISE: To improve strength, endurance, ROM, and flexibility.  Demonstration, verbal and tactile cues throughout for technique. NuStep L2 x 6 min Church pews w/ counter support 2 x 15 - cued not to bring hips fwd Seated LAQ x 10 B Supine heel slides w/ strap 2 x 10 Modified Thomas stretch L x 30" Standing gastroc stretch against wall x 30" B TUG w/ RW 18.5 sec   MODALITIES: Game Ready vasopneumatic compression post session to L knee x 10 min, med compression, 34 to reduce post-exercise pain and swelling/edema   09/29/23 THERAPEUTIC EXERCISE: To improve strength, endurance, ROM, and flexibility.  Demonstration, verbal and tactile cues throughout for technique. Supine HS stretch w/ strap Seated Passive Extension Stretch Supine quad sets x 3 x 3-5"  MODALITIES: Game Ready vasopneumatic compression post session to L knee x 10 min, med compression, 34 to reduce post-exercise pain and swelling/edema  PATIENT EDUCATION:  Education details: initial HEP, review hospital HEP Person educated: Patient Education method: Explanation, Demonstration, Tactile cues, Verbal  cues, and Handouts Education comprehension: verbalized understanding, returned demonstration, verbal cues required, tactile cues required, and needs further education  HOME EXERCISE PROGRAM: Access Code: [email or web address] URL: [email or web address] Date: 10/01/2023 Prepared by: Talia Joseph-Greene  Exercises - Supine Hamstring Stretch with Strap (Mirrored)  - 2-3 x daily - 7 x weekly - 3 reps - 30 sec hold - Quad Setting and Stretching  - 2-3 x daily - 7 x weekly - 2 sets - 10 reps - 3-5 sec hold - Seated Passive Knee Extension  - 2-3 x daily - 7 x weekly - 3 reps - 30-60 sec hold - Church Pew  - 1 x daily - 5-7 x weekly - 2 sets - 10 reps - Modified Thomas Stretch  - 1 x daily - 5-7 x weekly - 2 sets - 30 hold - Supine Heel Slide with Strap  - 1 x daily - 7 x weekly - 2 sets - 10 reps - Seated Long Arc Quad  - 1 x daily - 7 x weekly - 2 sets - 10 reps  ASSESSMENT:  CLINICAL IMPRESSION: Continued with ROM focused exercises along with some gentle strengthening. ROM has improved today meeting STG #4 but she needs a lot more work on extension going forward. Gentle exercises this session due to increased soreness from last visit. GR post session to control swelling.  Jadon will benefit from continued skilled PT to address above deficits to improve mobility and activity tolerance with decreased pain, swelling and stiffness interference.   OBJECTIVE IMPAIRMENTS: Abnormal gait, decreased activity tolerance, decreased balance, decreased coordination, decreased endurance, decreased knowledge of condition, decreased knowledge of use of DME, decreased mobility, difficulty walking, decreased ROM, decreased strength, increased edema, increased fascial restrictions, impaired perceived functional ability, increased  muscle spasms, impaired flexibility, impaired sensation, impaired tone, improper body mechanics, and pain.   ACTIVITY LIMITATIONS: lifting, bending, sitting, standing, squatting, sleeping,  stairs, transfers, bed mobility, bathing, dressing, hygiene/grooming, and locomotion level  PARTICIPATION LIMITATIONS: meal prep, cleaning, laundry, driving, and community activity  PERSONAL FACTORS: Past/current experiences, Time since onset of injury/illness/exacerbation, and 3+ comorbidities: Borderline diabetes, hip arthritis, HTN  are also affecting patient's functional outcome.   REHAB POTENTIAL: Good  CLINICAL DECISION MAKING: Evolving/moderate complexity  EVALUATION COMPLEXITY: Moderate   GOALS: Goals reviewed with patient? Yes  SHORT TERM GOALS: Target date: 10/27/2023  1.  Pt will be independent with initial HEP Baseline:  Goal status: IN PROGRESS  2.  Patient will report at least 25% improvement in L knee pain to improve QOL. Baseline: 5/10 currently, 9.5/10 worst Goal status: IN PROGRESS  3.  Pt will report >/= 26/80 on LEFS (MCID = 9 pts) to demonstrate improved functional ability Baseline: 17 / 80 = 21.3 % Goal status: IN PROGRESS  4.  Pt will have improve L knee ROM to -10 to 90 to normalize gait pattern  Baseline: Refer to ROM table Goal status: MET- 10/06/23  5.  Pt will be able to perform TUG <20 sec w/ AD  Baseline: 18.5 sec w/ RW Goal status: IN PROGRESS   LONG TERM GOALS: Target date: 11/24/2023   1.  Pt will be able to perform TUG <=13.5 sec w/o AD Baseline: 18.5 sec w/ RW Goal status: IN PROGRESS  2.  Pt will demonstrate improved L LE strength to >/= 4+/5 for improved stability and ease of mobility. Baseline: Refer to MMT table Goal status: IN PROGRESS  3.  Pt will report >/= 35/80 on LEFS (MCID = 9 pts) to demonstrate improved functional ability Baseline: 17 / 80 = 21.3 % Goal status: IN PROGRESS  4.  Pt will have improve L knee ROM to 0 to 110 to normalize gait pattern  Baseline:  Goal status: IN PROGRESS  5.  Pt will be able to ambulate 600" w/o AD with normal gait pattern to return to community walking Baseline:  Goal status: IN  PROGRESS  6.  Pt will be able to sit or stand for 1hr w/o increased pain in L knee Baseline: Quite difficult to do  Goal status: IN PROGRESS   PLAN:  PT FREQUENCY: 2-3x/week - 3x/wk x 2 weeks, tapering to 2x/week for duration of POC   PT DURATION: 8 weeks  PLANNED INTERVENTIONS: 97164- PT Re-evaluation, 97750- Physical Performance Testing, 97110-Therapeutic exercises, 97530- Therapeutic activity, [id number]- Neuromuscular re-education, 97535- Self Care, 81191- Manual therapy, [id number]- Gait training, [phone]- Aquatic Therapy, [phone]- Electrical stimulation (manual), 97016- Vasopneumatic device, 97035- Ultrasound, 08657- Ionotophoresis 4mg /ml Dexamethasone , Patient/Family education, Balance training, Stair training, Taping, Dry Needling, Joint mobilization, Scar mobilization, Compression bandaging, DME instructions, Cryotherapy, and Moist heat  PLAN FOR NEXT SESSION: Progress initial HEP, progress flexibility & ROM, introduce joint mobs, progress LE/core strengthening exercises    Jeweline Reif L Zylan Almquist, PTA 10/06/2023, 12:12 PM     Date of referral: 08/29/23 Referring provider: Claiborne Crew, MD Referring diagnosis? M17.12 (ICD-10-CM) - Unilateral primary osteoarthritis, left knee  Treatment diagnosis? (if different than referring diagnosis)  Muscle weakness (generalized)  Acute pain of left knee  Stiffness of left knee, not elsewhere classified  Other abnormalities of gait and mobility  Localized edema  What was this (referring dx) caused by? Surgery (Type: L TKA)  Nature of Condition: Initial Onset (within last 3 months)   Laterality: Lt  Current Functional Measure Score: LEFS 17 / 80 = 21.3 %  Objective measurements identify impairments when they are compared to normal values, the uninvolved extremity, and prior level of function.  [x]  Yes  []  No  Objective assessment of functional ability: Moderate functional limitations   Briefly describe symptoms: L knee dull pain and swelling,  limited and painful L knee flexion/extension ROM, B hip discomfort, B LE/core muscle weakness  How did symptoms start: After L TKA surgery on 09/23/23  Average pain intensity:  Last 24 hours: 6/10  Past week: 8/10  How often does the pt experience symptoms? Constantly  How much have the symptoms interfered with usual daily activities? Quite a bit  How has condition changed since care began at this facility? NA - initial visit  In general, how is the patients overall health? Very Good  Onset date: 09/23/23   BACK PAIN (STarT Back Screening Tool) - (When applicable):  Has your back pain spread down your leg(s) at sometime in the last 2 weeks? []  Yes   []  No Have you had pain in the shoulder or neck at sometime in the past 2 weeks? []  Yes   []  No Have you only walked short distances because of your back pain? []  Yes   []  No In the past 2 weeks, have you dressed more slowly than usual because of your back pain? []  Yes   []  No Do you think it is not really safe for person with a condition like yours to be physically active? []  Yes   []  No Have worrying thoughts been going through your mind a lot of the time? []  Yes   []  No Do you feel that your back pain is terrible and it is never going to get any better? []  Yes   []  No In general, have you stopped enjoying all the things you usually enjoy? []  Yes   []  No Overall, how bothersome has your back pain been in the last 2 weeks? []  Not at all   []  Slightly     []  Moderate   []  Very much     []  Extremely

## 2023-10-07 NOTE — Discharge Summary (Signed)
 Patient ID: Patricia Lee MRN: [id number] DOB/AGE: Nov 04, 1945 78 y.o.  Admit date: 09/23/2023 Discharge date: 09/24/2023  Admission Diagnoses:  Left knee osteoarthritis  Discharge Diagnoses:  Principal Problem:   S/P total knee arthroplasty, left   Past Medical History:  Diagnosis Date   Allergy    spring seasonal   Arthritis    hip    Borderline diabetes 12/06/2016   Hyperlipidemia 10/21/2016   Hypertension    Pneumonia 2019   Preventative health care 02/19/2022    Surgeries: Procedure(s): ARTHROPLASTY, KNEE, TOTAL on 09/23/2023   Consultants:   Discharged Condition: Improved  Hospital Course: Patricia Lee is an 78 y.o. female who was admitted 09/23/2023 for operative treatment ofS/P total knee arthroplasty, left. Patient has severe unremitting pain that affects sleep, daily activities, and work/hobbies. After pre-op clearance the patient was taken to the operating room on 09/23/2023 and underwent  Procedure(s): ARTHROPLASTY, KNEE, TOTAL.    Patient was given perioperative antibiotics:  Anti-infectives (From admission, onward)    Start     Dose/Rate Route Frequency Ordered Stop   09/23/23 1945  ceFAZolin  (ANCEF ) IVPB 2g/100 mL premix        2 g 200 mL/hr over 30 Minutes Intravenous Every 6 hours 09/23/23 1617 09/24/23 0216   09/23/23 1200  ceFAZolin  (ANCEF ) IVPB 2g/100 mL premix        2 g 200 mL/hr over 30 Minutes Intravenous On call to O.R. 09/23/23 1155 09/23/23 1340        Patient was given sequential compression devices, early ambulation, and chemoprophylaxis to prevent DVT. Patient worked with PT and was meeting their goals regarding safe ambulation and transfers.  Patient benefited maximally from hospital stay and there were no complications.    Recent vital signs: No data found.   Recent laboratory studies: No results for input(s): "WBC", "HGB", "HCT", "PLT", "NA", "K", "CL", "CO2", "BUN", "CREATININE", "GLUCOSE", "INR", "CALCIUM " in the last 72  hours.  Invalid input(s): "PT", "2"   Discharge Medications:   Allergies as of 09/24/2023       Reactions   Codeine Other (See Comments)   Tachycardia and insomnia   Erythromycin Other (See Comments)   Tachycardia and insomnia   Influenza Vaccines Other (See Comments)   Nerve reaction similar to shingles- all nerves in back on fire    Prednisone Other (See Comments)   Tachycardia and insomnia   Tetracyclines & Related Other (See Comments)   Tachycardia/insomnia        Medication List     TAKE these medications    amLODipine  2.5 MG tablet Commonly known as: NORVASC  TAKE 1 TABLET BY MOUTH TWICE  DAILY   APPLE CIDER VINEGAR PO Take 1 tablet by mouth daily.   Aspirin  Low Dose 81 MG chewable tablet Generic drug: aspirin  Chew 1 tablet (81 mg total) by mouth 2 (two) times daily for 28 days.   atorvastatin  10 MG tablet Commonly known as: LIPITOR TAKE 1 TABLET BY MOUTH DAILY   betamethasone  dipropionate 0.05 % cream Apply topically 2 (two) times daily. What changed:  how much to take when to take this reasons to take this   chlorthalidone  25 MG tablet Commonly known as: HYGROTON  TAKE 1 TABLET BY MOUTH DAILY   COLLAGEN PO Take 2,500 mg by mouth daily.   [employer] Fish Oil 1200 MG Caps Take 1,200 mg by mouth daily.   gabapentin  300 MG capsule Commonly known as: NEURONTIN  Take 1 capsule (300 mg total) by mouth once daily as  needed (back pain).   methocarbamol  500 MG tablet Commonly known as: ROBAXIN  Take 1 tablet (500 mg total) by mouth every 6 (six) hours as needed for muscle spasms.   multivitamin with minerals tablet Take 1 tablet by mouth daily.   oxyCODONE  5 MG immediate release tablet Commonly known as: Oxy IR/ROXICODONE  Take 1 tablet (5 mg total) by mouth every 4 (four) hours as needed for severe pain (pain score 7-10).   polycarbophil 625 MG tablet Commonly known as: FIBERCON Take 625 mg by mouth daily.   polyethylene glycol powder 17 GM/SCOOP  powder Commonly known as: GLYCOLAX /MIRALAX  Dissolve 17 g in liquid and take by mouth 2 (two) times daily.   senna 8.6 MG Tabs tablet Commonly known as: SENOKOT Take 2 tablets (17.2 mg total) by mouth at bedtime for 14 days.   Turmeric 500 MG Caps Take 500 mg by mouth daily.   Vitamin D 50 MCG (2000 UT) tablet Take 2,000 Units by mouth daily.               Discharge Care Instructions  (From admission, onward)           Start     Ordered   09/24/23 0000  Change dressing       Comments: Maintain surgical dressing until follow up in the clinic. If the edges start to pull up, may reinforce with tape. If the dressing is no longer working, may remove and cover with gauze and tape, but must keep the area dry and clean.  Call with any questions or concerns.   09/24/23 0806            Diagnostic Studies: No results found.  Disposition: Discharge disposition: 01-Home or Self Care       Discharge Instructions     Call MD / Call 911   Complete by: As directed    If you experience chest pain or shortness of breath, CALL 911 and be transported to the hospital emergency room.  If you develope a fever above 101 F, pus (white drainage) or increased drainage or redness at the wound, or calf pain, call your surgeon's office.   Change dressing   Complete by: As directed    Maintain surgical dressing until follow up in the clinic. If the edges start to pull up, may reinforce with tape. If the dressing is no longer working, may remove and cover with gauze and tape, but must keep the area dry and clean.  Call with any questions or concerns.   Constipation Prevention   Complete by: As directed    Drink plenty of fluids.  Prune juice may be helpful.  You may use a stool softener, such as Colace (over the counter) 100 mg twice a day.  Use MiraLax  (over the counter) for constipation as needed.   Diet - low sodium heart healthy   Complete by: As directed    Increase activity slowly  as tolerated   Complete by: As directed    Weight bearing as tolerated with assist device (walker, cane, etc) as directed, use it as long as suggested by your surgeon or therapist, typically at least 4-6 weeks.   Post-operative opioid taper instructions:   Complete by: As directed    POST-OPERATIVE OPIOID TAPER INSTRUCTIONS: It is important to wean off of your opioid medication as soon as possible. If you do not need pain medication after your surgery it is ok to stop day one. Opioids include: Codeine, Hydrocodone (Norco, Vicodin), Oxycodone (Percocet, oxycontin )  and hydromorphone  amongst others.  Long term and even short term use of opiods can cause: Increased pain response Dependence Constipation Depression Respiratory depression And more.  Withdrawal symptoms can include Flu like symptoms Nausea, vomiting And more Techniques to manage these symptoms Hydrate well Eat regular healthy meals Stay active Use relaxation techniques(deep breathing, meditating, yoga) Do Not substitute Alcohol to help with tapering If you have been on opioids for less than two weeks and do not have pain than it is ok to stop all together.  Plan to wean off of opioids This plan should start within one week post op of your joint replacement. Maintain the same interval or time between taking each dose and first decrease the dose.  Cut the total daily intake of opioids by one tablet each day Next start to increase the time between doses. The last dose that should be eliminated is the evening dose.      TED hose   Complete by: As directed    Use stockings (TED hose) for 2 weeks on both leg(s).  You may remove them at night for sleeping.        Follow-up Information     Claiborne Crew, MD. Go on 10/08/2023.   Specialty: Orthopedic Surgery Why: You are scheduled for first post op appt on Wednesday April 30 at 2:55pm. Contact information: [address] [place]  [address] [phone]                  Signed: Earnie Gola 10/07/2023, 7:12 AM

## 2023-10-08 ENCOUNTER — Ambulatory Visit

## 2023-10-08 ENCOUNTER — Other Ambulatory Visit (HOSPITAL_BASED_OUTPATIENT_CLINIC_OR_DEPARTMENT_OTHER): Payer: Self-pay

## 2023-10-08 DIAGNOSIS — R2689 Other abnormalities of gait and mobility: Secondary | ICD-10-CM | POA: Diagnosis not present

## 2023-10-08 DIAGNOSIS — M25662 Stiffness of left knee, not elsewhere classified: Secondary | ICD-10-CM

## 2023-10-08 DIAGNOSIS — R600 Localized edema: Secondary | ICD-10-CM | POA: Diagnosis not present

## 2023-10-08 DIAGNOSIS — M25562 Pain in left knee: Secondary | ICD-10-CM | POA: Diagnosis not present

## 2023-10-08 DIAGNOSIS — M6281 Muscle weakness (generalized): Secondary | ICD-10-CM | POA: Diagnosis not present

## 2023-10-08 MED ORDER — AMOXICILLIN 500 MG PO TABS
2000.0000 mg | ORAL_TABLET | Freq: Every day | ORAL | 2 refills | Status: DC
  Filled 2023-10-08: qty 4, 1d supply, fill #0
  Filled 2024-01-01: qty 4, 1d supply, fill #1
  Filled 2024-03-09 (×2): qty 4, 1d supply, fill #2

## 2023-10-08 MED ORDER — OXYCODONE HCL 5 MG PO TABS
5.0000 mg | ORAL_TABLET | ORAL | 0 refills | Status: DC | PRN
  Filled 2023-10-08: qty 30, 5d supply, fill #0

## 2023-10-08 NOTE — Therapy (Signed)
 OUTPATIENT PHYSICAL THERAPY LOWER EXTREMITY TREATMENT   Patient Name: Patricia Lee MRN: [id number] DOB:Apr 18, 1946, 78 y.o., female Today's Date: 10/08/2023  END OF SESSION:  PT End of Session - 10/08/23 1151     Visit Number 5    Date for PT Re-Evaluation 11/24/23    Authorization Type [employer]    Authorization Time Period 09/20/23-11/24/23    Authorization - Visit Number 5    Authorization - Number of Visits 14    PT Start Time 1105    PT Stop Time 1201    PT Time Calculation (min) 56 min    Activity Tolerance Patient tolerated treatment well    Behavior During Therapy [facility] for tasks assessed/performed                 Past Medical History:  Diagnosis Date   Allergy    spring seasonal   Arthritis    hip    Borderline diabetes 12/06/2016   Hyperlipidemia 10/21/2016   Hypertension    Pneumonia 2019   Preventative health care 02/19/2022   Past Surgical History:  Procedure Laterality Date   COLONOSCOPY     JOINT REPLACEMENT  Right hip   POLYPECTOMY     TOTAL HIP ARTHROPLASTY Right 04/09/2016   Procedure: RIGHT TOTAL HIP ARTHROPLASTY ANTERIOR APPROACH;  Surgeon: Claiborne Crew, MD;  Location: [facility];  Service: Orthopedics;  Laterality: Right;   TOTAL KNEE ARTHROPLASTY Left 09/23/2023   Procedure: ARTHROPLASTY, KNEE, TOTAL;  Surgeon: Claiborne Crew, MD;  Location: [facility];  Service: Orthopedics;  Laterality: Left;   Patient Active Problem List   Diagnosis Date Noted   S/P total knee arthroplasty, left 09/23/2023   Pre-op evaluation 08/15/2023   Back pain 06/24/2023   Tinnitus 02/25/2023   COVID-19 01/07/2023   Hyponatremia 01/07/2023   Preventative health care 02/19/2022   Hyperglycemia 02/13/2021   Chronic left shoulder pain 02/13/2021   Borderline diabetes 12/06/2016   Hyperlipidemia 10/21/2016   Overweight (BMI 25.0-29.9) 04/10/2016   S/P right THA, 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   History of skin cancer 07/28/2012   HERPES ZOSTER 11/27/2008   PREMATURE  VENTRICULAR CONTRACTIONS 11/17/2008   Essential hypertension 09/10/2007    PCP: Dorrene Gaucher, NP  REFERRING PROVIDER: Claiborne Crew, MD  REFERRING DIAG: [phone] (ICD-10-CM) - Unilateral primary osteoarthritis, left knee   THERAPY DIAG:  Muscle weakness (generalized)  Acute pain of left knee  Stiffness of left knee, not elsewhere classified  Other abnormalities of gait and mobility  Localized edema  RATIONALE FOR EVALUATION AND TREATMENT: Rehabilitation  ONSET DATE: 09/23/23  NEXT MD VISIT: 10/08/23   SUBJECTIVE:   SUBJECTIVE STATEMENT:  Pt reports she feels better overall, less pain and using cane now.  PERTINENT HISTORY: Borderline diabetes, hip arthritis, HTN PAIN:  Are you having pain? Yes: NPRS scale: 3/10 worst Pain location: medial and lateral knee Pain description:  occassionally sharp, most times achy and tired Aggravating factors: bending knee, standing too long, turning over in bed, keeping legs straight Relieving factors: not moving  PRECAUTIONS: Knee  RED FLAGS: None   WEIGHT BEARING RESTRICTIONS: No  FALLS:  Has patient fallen in last 6 months? No  LIVING ENVIRONMENT: Lives with: lives alone Lives in: House/apartment Stairs: Yes: Internal: 1 steps; none Has following equipment at home: Single point cane, Walker - 2 wheeled, and elevated toilet seat  OCCUPATION: Retired  PLOF: Independent: [employer] 3x/wk, walk in neighborhood, [employer] trips, walk 2 blocks  PATIENT GOALS: Be able to walk,  swim, go to the beach, walk in sand w/o any pain, get back to normal life   OBJECTIVE:  Note: Objective measures were completed at Evaluation unless otherwise noted.  DIAGNOSTIC FINDINGS:   PATIENT SURVEYS:  LEFS 17 / 80 = 21.3 %  COGNITION: Overall cognitive status: Within functional limits for tasks assessed     SENSATION: WFL  EDEMA:  Circumferential:   R = 41.0 cm at 6 cm above patella, 38.5 cm at mid-patella, 34.0 cm at  6 cm below patella L = 46.5 cm at 6 cm above patella, 44.0 cm at mid-patella, 38.5 cm at 6 cm below patella  MUSCLE LENGTH: Hamstrings: mod tight R, mild tight  ITB: mild tight R Piriformis: NT Hip flexors: mild tight R  POSTURE: rounded shoulders, forward head, and flexed trunk   PALPATION:  TTP and pain in L lateral knee & posterior knee/popliteal region  LOWER EXTREMITY ROM:  Active ROM Right eval Left eval L 10/03/23 Supine AROM L 10/06/23 Supine AROM  Hip flexion      Hip extension      Hip abduction      Hip adduction      Hip internal rotation      Hip external rotation      Knee flexion 110 81 85 102  Knee extension -9 in LAQ,      0 supported -22 in LAQ,   -21 supported 13 9  Ankle dorsiflexion 4 4-    Ankle plantarflexion      Ankle inversion      Ankle eversion       (Blank rows = not tested)  LOWER EXTREMITY MMT:  MMT Right eval Left eval  Hip flexion    Hip extension 4 4- (tested in S/L)  Hip abduction 4 3+  Hip adduction 4 4-  Hip internal rotation    Hip external rotation    Knee flexion 4 3+ ^ dull  Knee extension 4 3+^ dull  Ankle dorsiflexion 4 4-   (Blank rows = not tested) ^: pain  FUNCTIONAL TESTS:  Timed up and go (TUG): 18.5 sec with RW (10/01/23)                                                                                                                           TREATMENT DATE:   10/08/23 Recumbent Bike x 4 min partial revolutions: 3 min full rev on seat #8 Reviewed gait with SBQC- adjusted AD height and position Standing heel/toe raises 2x10  Standing TKE with ball on counter 10x3"- cues to contract quads Retro step x 10 no support L runner stretch at counter 2x30" Supine knee extension towel under heel x 10 with gentle OP; 5x with gastroc strap stretch  Supine L SLR + QS 2x10 Supine AAROM heel slides using peanut ball 2x10 Game Ready vasopneumatic compression post session to L knee x 10 min, med compression, 34 to reduce  post-exercise pain and swelling/edema  10/06/23 Gait training: with SPC cues for heel strike and proper gait sequence  THERAPEUTIC EXERCISE: To improve strength, endurance, ROM, and flexibility.  Demonstration, verbal and tactile cues throughout for technique. Nustep L5x7min Seated L heel slides 2x10 AROM Seated L LAQ 2x10 Seated ball squeeze 2x10 Seated PF with black TB 2x10 Supine L quad stretch with strap 2x30" Supine heel slide feet on peanut ball with strap 2x10 Supine gastroc stretch with heel propped on peanut ball 2x30" Supine SLR + QS x 10 Supine knee flexion and extension PROM Game Ready vasopneumatic compression post session to L knee x 10 min, med compression, 34 to reduce post-exercise pain and swelling/edema  10/03/23 THERAPEUTIC EXERCISE: To improve strength, endurance, ROM, and flexibility.  Demonstration, verbal and tactile cues throughout for technique. Bike x partial rev Seated L LAQ 2 x 10  Seated L heel slide 2 x 10  Seated L gastroc + HS stretch with strap 2x30" L runner stretch 2x30" walker support Standing heel raises with walker support 2x10 Standing marching with walker x 10 B Supine l QS with towel behind knee 10x5" Supine L SLR + QS x 10 Heel slides with peanut ball and strap 2x10 Supine gastroc stretch on peanut ball 2x30" MODALITIES: Game Ready vasopneumatic compression post session to L knee x 10 min, med compression, 34 to reduce post-exercise pain and swelling/edema  10/01/23 THERAPEUTIC EXERCISE: To improve strength, endurance, ROM, and flexibility.  Demonstration, verbal and tactile cues throughout for technique. NuStep L2 x 6 min [employer] pews w/ counter support 2 x 15 - cued not to bring hips fwd Seated LAQ x 10 B Supine heel slides w/ strap 2 x 10 Modified Thomas stretch L x 30" Standing gastroc stretch against wall x 30" B TUG w/ RW 18.5 sec   MODALITIES: Game Ready vasopneumatic compression post session to L knee x 10 min, med  compression, 34 to reduce post-exercise pain and swelling/edema   09/29/23 THERAPEUTIC EXERCISE: To improve strength, endurance, ROM, and flexibility.  Demonstration, verbal and tactile cues throughout for technique. Supine HS stretch w/ strap Seated Passive Extension Stretch Supine quad sets x 3 x 3-5"  MODALITIES: Game Ready vasopneumatic compression post session to L knee x 10 min, med compression, 34 to reduce post-exercise pain and swelling/edema  PATIENT EDUCATION:  Education details: initial HEP, review hospital HEP Person educated: Patient Education method: Explanation, Demonstration, Tactile cues, Verbal cues, and Handouts Education comprehension: verbalized understanding, returned demonstration, verbal cues required, tactile cues required, and needs further education  HOME EXERCISE PROGRAM: Access Code: [email or web address] URL: [email or web address] Date: 10/01/2023 Prepared by: Talia Joseph-Greene  Exercises - Supine Hamstring Stretch with Strap (Mirrored)  - 2-3 x daily - 7 x weekly - 3 reps - 30 sec hold - Quad Setting and Stretching  - 2-3 x daily - 7 x weekly - 2 sets - 10 reps - 3-5 sec hold - Seated Passive Knee Extension  - 2-3 x daily - 7 x weekly - 3 reps - 30-60 sec hold - [employer] P[employer]  - 1 x daily - 5-7 x weekly - 2 sets - 10 reps - Modified Thomas Stretch  - 1 x daily - 5-7 x weekly - 2 sets - 30 hold - Supine Heel Slide with Strap  - 1 x daily - 7 x weekly - 2 sets - 10 reps - Seated Long Arc Quad  - 1 x daily - 7 x weekly - 2 sets - 10 reps  ASSESSMENT:  CLINICAL IMPRESSION:  Continued with ROM focused exercises focused on knee extension. Also worked on quad engagement in [employer] bearing to improve proprioceptive sense. She is now using a SBQC for ambulation  with good stability, I adjusted the height of her cane and the prong position as well. GR post session to control swelling. She see's the surgeon later on today. Yanci will benefit from continued  skilled PT to address above deficits to improve mobility and activity tolerance with decreased pain, swelling and stiffness interference.   OBJECTIVE IMPAIRMENTS: Abnormal gait, decreased activity tolerance, decreased balance, decreased coordination, decreased endurance, decreased knowledge of condition, decreased knowledge of use of DME, decreased mobility, difficulty walking, decreased ROM, decreased strength, increased edema, increased fascial restrictions, impaired perceived functional ability, increased muscle spasms, impaired flexibility, impaired sensation, impaired tone, improper body mechanics, and pain.   ACTIVITY LIMITATIONS: lifting, bending, sitting, standing, squatting, sleeping, stairs, transfers, bed mobility, bathing, dressing, hygiene/grooming, and locomotion level  PARTICIPATION LIMITATIONS: meal prep, cleaning, laundry, driving, and community activity  PERSONAL FACTORS: Past/current experiences, Time since onset of injury/illness/exacerbation, and 3+ comorbidities: Borderline diabetes, hip arthritis, HTN  are also affecting patient's functional outcome.   REHAB POTENTIAL: Good  CLINICAL DECISION MAKING: Evolving/moderate complexity  EVALUATION COMPLEXITY: Moderate   GOALS: Goals reviewed with patient? Yes  SHORT TERM GOALS: Target date: 10/27/2023  1.  Pt will be independent with initial HEP Baseline:  Goal status: IN PROGRESS  2.  Patient will report at least 25% improvement in L knee pain to improve QOL. Baseline: 5/10 currently, 9.5/10 worst Goal status: IN PROGRESS  3.  Pt will report >/= 26/80 on LEFS (MCID = 9 pts) to demonstrate improved functional ability Baseline: 17 / 80 = 21.3 % Goal status: IN PROGRESS  4.  Pt will have improve L knee ROM to -10 to 90 to normalize gait pattern  Baseline: Refer to ROM table Goal status: MET- 10/06/23  5.  Pt will be able to perform TUG <20 sec w/ AD  Baseline: 18.5 sec w/ RW Goal status: IN PROGRESS   LONG  TERM GOALS: Target date: 11/24/2023   1.  Pt will be able to perform TUG <=13.5 sec w/o AD Baseline: 18.5 sec w/ RW Goal status: IN PROGRESS  2.  Pt will demonstrate improved L LE strength to >/= 4+/5 for improved stability and ease of mobility. Baseline: Refer to MMT table Goal status: IN PROGRESS  3.  Pt will report >/= 35/80 on LEFS (MCID = 9 pts) to demonstrate improved functional ability Baseline: 17 / 80 = 21.3 % Goal status: IN PROGRESS  4.  Pt will have improve L knee ROM to 0 to 110 to normalize gait pattern  Baseline:  Goal status: IN PROGRESS  5.  Pt will be able to ambulate 600" w/o AD with normal gait pattern to return to community walking Baseline:  Goal status: IN PROGRESS  6.  Pt will be able to sit or stand for 1hr w/o increased pain in L knee Baseline: Quite difficult to do  Goal status: IN PROGRESS   PLAN:  PT FREQUENCY: 2-3x/week - 3x/wk x 2 weeks, tapering to 2x/week for duration of POC   PT DURATION: 8 weeks  PLANNED INTERVENTIONS: 97164- PT Re-evaluation, 97750- Physical Performance Testing, 97110-Therapeutic exercises, 97530- Therapeutic activity, [id number]- Neuromuscular re-education, 97535- Self Care, 16109- Manual therapy, [id number]- Gait training, [id number]- Aquatic Therapy, [id number]- Electrical stimulation (manual), [id number]- Vasopneumatic device, [id number]- Ultrasound, [id number]- Ionotophoresis 4mg /ml Dexamethasone , Patient/Family education, Balance training, Stair training, Taping, Dry Needling,  Joint mobilization, Scar mobilization, Compression bandaging, DME instructions, Cryotherapy, and Moist heat  PLAN FOR NEXT SESSION: how was MD visit? progress flexibility & ROM, introduce joint mobs, progress LE/core strengthening exercises    Aidyn Kellis L Edmonia Gonser, PTA 10/08/2023, 11:52 AM     Date of referral: 08/29/23 Referring provider: Claiborne Crew, MD Referring diagnosis? M17.12 (ICD-10-CM) - Unilateral primary osteoarthritis, left knee  Treatment diagnosis? (if different  than referring diagnosis)  Muscle weakness (generalized)  Acute pain of left knee  Stiffness of left knee, not elsewhere classified  Other abnormalities of gait and mobility  Localized edema  What was this (referring dx) caused by? Surgery (Type: L TKA)  Nature of Condition: Initial Onset (within last 3 months)   Laterality: Lt  Current Functional Measure Score: LEFS 17 / 80 = 21.3 %  Objective measurements identify impairments when they are compared to normal values, the uninvolved extremity, and prior level of function.  [x]  Yes  []  No  Objective assessment of functional ability: Moderate functional limitations   Briefly describe symptoms: L knee dull pain and swelling, limited and painful L knee flexion/extension ROM, B hip discomfort, B LE/core muscle weakness  How did symptoms start: After L TKA surgery on 09/23/23  Average pain intensity:  Last 24 hours: 6/10  Past week: 8/10  How often does the pt experience symptoms? Constantly  How much have the symptoms interfered with usual daily activities? Quite a bit  How has condition changed since care began at this facility? NA - initial visit  In general, how is the patients overall health? Very Good  Onset date: 09/23/23   BACK PAIN (STarT Back Screening Tool) - (When applicable):  Has your back pain spread down your leg(s) at sometime in the last 2 weeks? []  Yes   []  No Have you had pain in the shoulder or neck at sometime in the past 2 weeks? []  Yes   []  No Have you only walked short distances because of your back pain? []  Yes   []  No In the past 2 weeks, have you dressed more slowly than usual because of your back pain? []  Yes   []  No Do you think it is not really safe for person with a condition like yours to be physically active? []  Yes   []  No Have worrying thoughts been going through your mind a lot of the time? []  Yes   []  No Do you feel that your back pain is terrible and it is never going to get any  better? []  Yes   []  No In general, have you stopped enjoying all the things you usually enjoy? []  Yes   []  No Overall, how bothersome has your back pain been in the last 2 weeks? []  Not at all   []  Slightly     []  Moderate   []  Very much     []  Extremely

## 2023-10-10 ENCOUNTER — Ambulatory Visit: Attending: Orthopedic Surgery

## 2023-10-10 ENCOUNTER — Encounter: Payer: Self-pay | Admitting: Family

## 2023-10-10 ENCOUNTER — Other Ambulatory Visit (INDEPENDENT_AMBULATORY_CARE_PROVIDER_SITE_OTHER)

## 2023-10-10 DIAGNOSIS — R600 Localized edema: Secondary | ICD-10-CM | POA: Diagnosis not present

## 2023-10-10 DIAGNOSIS — M25662 Stiffness of left knee, not elsewhere classified: Secondary | ICD-10-CM | POA: Diagnosis not present

## 2023-10-10 DIAGNOSIS — R2689 Other abnormalities of gait and mobility: Secondary | ICD-10-CM

## 2023-10-10 DIAGNOSIS — M6281 Muscle weakness (generalized): Secondary | ICD-10-CM

## 2023-10-10 DIAGNOSIS — M25562 Pain in left knee: Secondary | ICD-10-CM | POA: Diagnosis not present

## 2023-10-10 DIAGNOSIS — N289 Disorder of kidney and ureter, unspecified: Secondary | ICD-10-CM

## 2023-10-10 LAB — BASIC METABOLIC PANEL WITH GFR
BUN: 19 mg/dL (ref 6–23)
CO2: 31 meq/L (ref 19–32)
Calcium: 10 mg/dL (ref 8.4–10.5)
Chloride: 96 meq/L (ref 96–112)
Creatinine, Ser: 0.87 mg/dL (ref 0.40–1.20)
GFR: 63.89 mL/min (ref >60.00)
Glucose, Bld: 99 mg/dL (ref 70–99)
Potassium: 4.4 meq/L (ref 3.5–5.1)
Sodium: 136 meq/L (ref 135–145)

## 2023-10-10 NOTE — Therapy (Signed)
 OUTPATIENT PHYSICAL THERAPY LOWER EXTREMITY TREATMENT   Patient Name: Patricia Lee MRN: [id number] DOB:Feb 20, 1946, 78 y.o., female Today's Date: 10/10/2023  END OF SESSION:  PT End of Session - 10/10/23 1054     Visit Number 6    Date for PT Re-Evaluation 11/24/23    Authorization Type [employer]    Authorization Time Period 09/20/23-11/24/23    Authorization - Visit Number 6    Authorization - Number of Visits 14    PT Start Time 1051    PT Stop Time 1152    PT Time Calculation (min) 61 min    Activity Tolerance Patient tolerated treatment well    Behavior During Therapy [facility] for tasks assessed/performed                  Past Medical History:  Diagnosis Date   Allergy    spring seasonal   Arthritis    hip    Borderline diabetes 12/06/2016   Hyperlipidemia 10/21/2016   Hypertension    Pneumonia 2019   Preventative health care 02/19/2022   Past Surgical History:  Procedure Laterality Date   COLONOSCOPY     JOINT REPLACEMENT  Right hip   POLYPECTOMY     TOTAL HIP ARTHROPLASTY Right 04/09/2016   Procedure: RIGHT TOTAL HIP ARTHROPLASTY ANTERIOR APPROACH;  Surgeon: Claiborne Crew, MD;  Location: [facility];  Service: Orthopedics;  Laterality: Right;   TOTAL KNEE ARTHROPLASTY Left 09/23/2023   Procedure: ARTHROPLASTY, KNEE, TOTAL;  Surgeon: Claiborne Crew, MD;  Location: [facility];  Service: Orthopedics;  Laterality: Left;   Patient Active Problem List   Diagnosis Date Noted   S/P total knee arthroplasty, left 09/23/2023   Pre-op evaluation 08/15/2023   Back pain 06/24/2023   Tinnitus 02/25/2023   COVID-19 01/07/2023   Hyponatremia 01/07/2023   Preventative health care 02/19/2022   Hyperglycemia 02/13/2021   Chronic left shoulder pain 02/13/2021   Borderline diabetes 12/06/2016   Hyperlipidemia 10/21/2016   Overweight (BMI 25.0-29.9) 04/10/2016   S/P right THA, 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   History of skin cancer 07/28/2012   HERPES ZOSTER 11/27/2008   PREMATURE  VENTRICULAR CONTRACTIONS 11/17/2008   Essential hypertension 09/10/2007    PCP: Dorrene Gaucher, NP  REFERRING PROVIDER: Claiborne Crew, MD  REFERRING DIAG: [id number] (ICD-10-CM) - Unilateral primary osteoarthritis, left knee   THERAPY DIAG:  Muscle weakness (generalized)  Acute pain of left knee  Stiffness of left knee, not elsewhere classified  Other abnormalities of gait and mobility  Localized edema  RATIONALE FOR EVALUATION AND TREATMENT: Rehabilitation  ONSET DATE: 09/23/23  NEXT MD VISIT: 10/08/23   SUBJECTIVE:   SUBJECTIVE STATEMENT:  Pt reports the PA was pleased with her progress, she will go back on June 6th for check-up. Reports tightness in quads this morning.   PERTINENT HISTORY: Borderline diabetes, hip arthritis, HTN PAIN:  Are you having pain? Yes: NPRS scale: )/10 worst Pain location: N/A Pain description:  N/A Aggravating factors: bending knee, standing too long, turning over in bed, keeping legs straight Relieving factors: not moving  PRECAUTIONS: Knee  RED FLAGS: None   WEIGHT BEARING RESTRICTIONS: No  FALLS:  Has patient fallen in last 6 months? No  LIVING ENVIRONMENT: Lives with: lives alone Lives in: House/apartment Stairs: Yes: Internal: 1 steps; none Has following equipment at home: Single point cane, Walker - 2 wheeled, and elevated toilet seat  OCCUPATION: Retired  PLOF: Independent: [employer] 3x/wk, walk in neighborhood, [employer] trips, walk 2 blocks  PATIENT  GOALS: Be able to walk, swim, go to the beach, walk in sand w/o any pain, get back to normal life   OBJECTIVE:  Note: Objective measures were completed at Evaluation unless otherwise noted.  DIAGNOSTIC FINDINGS:   PATIENT SURVEYS:  LEFS 17 / 80 = 21.3 %  COGNITION: Overall cognitive status: Within functional limits for tasks assessed     SENSATION: WFL  EDEMA:  Circumferential:   R = 41.0 cm at 6 cm above patella, 38.5 cm at mid-patella, 34.0  cm at 6 cm below patella L = 46.5 cm at 6 cm above patella, 44.0 cm at mid-patella, 38.5 cm at 6 cm below patella  MUSCLE LENGTH: Hamstrings: mod tight R, mild tight  ITB: mild tight R Piriformis: NT Hip flexors: mild tight R  POSTURE: rounded shoulders, forward head, and flexed trunk   PALPATION:  TTP and pain in L lateral knee & posterior knee/popliteal region  LOWER EXTREMITY ROM:  Active ROM Right eval Left eval L 10/03/23 Supine AROM L 10/06/23 Supine AROM  Hip flexion      Hip extension      Hip abduction      Hip adduction      Hip internal rotation      Hip external rotation      Knee flexion 110 81 85 102  Knee extension -9 in LAQ,      0 supported -22 in LAQ,   -21 supported 13 9  Ankle dorsiflexion 4 4-    Ankle plantarflexion      Ankle inversion      Ankle eversion       (Blank rows = not tested)  LOWER EXTREMITY MMT:  MMT Right eval Left eval  Hip flexion    Hip extension 4 4- (tested in S/L)  Hip abduction 4 3+  Hip adduction 4 4-  Hip internal rotation    Hip external rotation    Knee flexion 4 3+ ^ dull  Knee extension 4 3+^ dull  Ankle dorsiflexion 4 4-   (Blank rows = not tested) ^: pain  FUNCTIONAL TESTS:  Timed up and go (TUG): 18.5 sec with RW (10/01/23)                                                                                                                           TREATMENT DATE:   10/10/23 \\Therapeutic  Exercise: to improve strength, ROM, flexibility, and endurance  Recumbent Bike x 2 min partial rev; x 4 min full rev Runner stretch 2 x 30"  Standing heel drop L gastroc stretch 2x30" 6' Prone hang x 1 min  NEUROMUSCULAR RE-EDUCATION: To improve proprioception, balance, and kinesthesia. Standing heel raises + TKE 2x10 Standing L TKE with ball on counter 10x5"; 2 sets Retro steps with arm raise x 10 with counter support; x 10 no support Manual Therapy: to decrease muscle spasm, pain and improve mobility.  STM to L medial  gatsroc and semitendinosus Supine knee  extension stretch and mobilization with overpressure   Game Ready vasopneumatic compression post session to L knee x 10 min, med compression, 34 to reduce post-exercise pain and swelling/edema  10/08/23 Recumbent Bike x 4 min partial revolutions: 3 min full rev on seat #8 Reviewed gait with SBQC- adjusted AD height and position Standing heel/toe raises 2x10  Standing TKE with ball on counter 10x3"- cues to contract quads Retro step x 10 no support L runner stretch at counter 2x30" Supine knee extension towel under heel x 10 with gentle OP; 5x with gastroc strap stretch  Supine L SLR + QS 2x10 Supine AAROM heel slides using peanut ball 2x10 Game Ready vasopneumatic compression post session to L knee x 10 min, med compression, 34 to reduce post-exercise pain and swelling/edema   10/06/23 Gait training: with SPC cues for heel strike and proper gait sequence  THERAPEUTIC EXERCISE: To improve strength, endurance, ROM, and flexibility.  Demonstration, verbal and tactile cues throughout for technique. Nustep L5x7min Seated L heel slides 2x10 AROM Seated L LAQ 2x10 Seated ball squeeze 2x10 Seated PF with black TB 2x10 Supine L quad stretch with strap 2x30" Supine heel slide feet on peanut ball with strap 2x10 Supine gastroc stretch with heel propped on peanut ball 2x30" Supine SLR + QS x 10 Supine knee flexion and extension PROM Game Ready vasopneumatic compression post session to L knee x 10 min, med compression, 34 to reduce post-exercise pain and swelling/edema  10/03/23 THERAPEUTIC EXERCISE: To improve strength, endurance, ROM, and flexibility.  Demonstration, verbal and tactile cues throughout for technique. Bike x partial rev Seated L LAQ 2 x 10  Seated L heel slide 2 x 10  Seated L gastroc + HS stretch with strap 2x30" L runner stretch 2x30" walker support Standing heel raises with walker support 2x10 Standing marching with walker x  10 B Supine l QS with towel behind knee 10x5" Supine L SLR + QS x 10 Heel slides with peanut ball and strap 2x10 Supine gastroc stretch on peanut ball 2x30" MODALITIES: Game Ready vasopneumatic compression post session to L knee x 10 min, med compression, 34 to reduce post-exercise pain and swelling/edema  10/01/23 THERAPEUTIC EXERCISE: To improve strength, endurance, ROM, and flexibility.  Demonstration, verbal and tactile cues throughout for technique. NuStep L2 x 6 min Church pews w/ counter support 2 x 15 - cued not to bring hips fwd Seated LAQ x 10 B Supine heel slides w/ strap 2 x 10 Modified Thomas stretch L x 30" Standing gastroc stretch against wall x 30" B TUG w/ RW 18.5 sec   MODALITIES: Game Ready vasopneumatic compression post session to L knee x 10 min, med compression, 34 to reduce post-exercise pain and swelling/edema   09/29/23 THERAPEUTIC EXERCISE: To improve strength, endurance, ROM, and flexibility.  Demonstration, verbal and tactile cues throughout for technique. Supine HS stretch w/ strap Seated Passive Extension Stretch Supine quad sets x 3 x 3-5"  MODALITIES: Game Ready vasopneumatic compression post session to L knee x 10 min, med compression, 34 to reduce post-exercise pain and swelling/edema  PATIENT EDUCATION:  Education details: initial HEP, review hospital HEP Person educated: Patient Education method: Explanation, Demonstration, Tactile cues, Verbal cues, and Handouts Education comprehension: verbalized understanding, returned demonstration, verbal cues required, tactile cues required, and needs further education  HOME EXERCISE PROGRAM: Access Code: [email or web address] URL: [email or web address] Date: 10/13/2023 Prepared by: Gryffin Altice  Exercises - Supine Hamstring Stretch with Strap (Mirrored)  - 2-3 x daily - 7 x  weekly - 3 reps - 30 sec hold - Quad Setting and Stretching  - 2-3 x daily - 7 x weekly - 2 sets - 10 reps - 3-5 sec  hold - Seated Passive Knee Extension  - 2-3 x daily - 7 x weekly - 3 reps - 30-60 sec hold - Church Pew  - 1 x daily - 5-7 x weekly - 2 sets - 10 reps - Modified Thomas Stretch  - 1 x daily - 5-7 x weekly - 2 sets - 30 hold - Supine Heel Slide with Strap  - 1 x daily - 7 x weekly - 2 sets - 10 reps - Seated Long Arc Quad  - 1 x daily - 7 x weekly - 2 sets - 10 reps - Gastroc Stretch on Wall  - 1 x daily - 7 x weekly - 2 sets - 2 reps - 30 seconds hold - Terminal Knee Extension with Ball and Counter  - 1 x daily - 7 x weekly - 2-3 sets - 10 reps - 5 seconds hold - Prone Knee Extension Hang  - 1 x daily - 7 x weekly - 2 sets - 2 reps - 1 min hold  ASSESSMENT:  CLINICAL IMPRESSION: Pt with good response to treatment. Still emphasizing knee extension stretching, adding prone hang to HEP. Her doctor is pleased with progress and will go back in 5 weeks. Worked on quad facilitation in weight bearing for increased stability. GR post session to address swelling and pain.  Celsa will benefit from continued skilled PT to address above deficits to improve mobility and activity tolerance with decreased pain, swelling and stiffness interference.   OBJECTIVE IMPAIRMENTS: Abnormal gait, decreased activity tolerance, decreased balance, decreased coordination, decreased endurance, decreased knowledge of condition, decreased knowledge of use of DME, decreased mobility, difficulty walking, decreased ROM, decreased strength, increased edema, increased fascial restrictions, impaired perceived functional ability, increased muscle spasms, impaired flexibility, impaired sensation, impaired tone, improper body mechanics, and pain.   ACTIVITY LIMITATIONS: lifting, bending, sitting, standing, squatting, sleeping, stairs, transfers, bed mobility, bathing, dressing, hygiene/grooming, and locomotion level  PARTICIPATION LIMITATIONS: meal prep, cleaning, laundry, driving, and community activity  PERSONAL FACTORS: Past/current  experiences, Time since onset of injury/illness/exacerbation, and 3+ comorbidities: Borderline diabetes, hip arthritis, HTN  are also affecting patient's functional outcome.   REHAB POTENTIAL: Good  CLINICAL DECISION MAKING: Evolving/moderate complexity  EVALUATION COMPLEXITY: Moderate   GOALS: Goals reviewed with patient? Yes  SHORT TERM GOALS: Target date: 10/27/2023  1.  Pt will be independent with initial HEP Baseline:  Goal status: IN PROGRESS  2.  Patient will report at least 25% improvement in L knee pain to improve QOL. Baseline: 5/10 currently, 9.5/10 worst Goal status: IN PROGRESS  3.  Pt will report >/= 26/80 on LEFS (MCID = 9 pts) to demonstrate improved functional ability Baseline: 17 / 80 = 21.3 % Goal status: IN PROGRESS  4.  Pt will have improve L knee ROM to -10 to 90 to normalize gait pattern  Baseline: Refer to ROM table Goal status: MET- 10/06/23  5.  Pt will be able to perform TUG <20 sec w/ AD  Baseline: 18.5 sec w/ RW Goal status: IN PROGRESS   LONG TERM GOALS: Target date: 11/24/2023   1.  Pt will be able to perform TUG <=13.5 sec w/o AD Baseline: 18.5 sec w/ RW Goal status: IN PROGRESS  2.  Pt will demonstrate improved L LE strength to >/= 4+/5 for improved stability and  ease of mobility. Baseline: Refer to MMT table Goal status: IN PROGRESS  3.  Pt will report >/= 35/80 on LEFS (MCID = 9 pts) to demonstrate improved functional ability Baseline: 17 / 80 = 21.3 % Goal status: IN PROGRESS  4.  Pt will have improve L knee ROM to 0 to 110 to normalize gait pattern  Baseline:  Goal status: IN PROGRESS  5.  Pt will be able to ambulate 600" w/o AD with normal gait pattern to return to community walking Baseline:  Goal status: IN PROGRESS  6.  Pt will be able to sit or stand for 1hr w/o increased pain in L knee Baseline: Quite difficult to do  Goal status: IN PROGRESS   PLAN:  PT FREQUENCY: 2-3x/week - 3x/wk x 2 weeks, tapering to  2x/week for duration of POC   PT DURATION: 8 weeks  PLANNED INTERVENTIONS: 97164- PT Re-evaluation, 97750- Physical Performance Testing, 97110-Therapeutic exercises, 97530- Therapeutic activity, [id number]- Neuromuscular re-education, 97535- Self Care, 16109- Manual therapy, [id number]- Gait training, [phone]- Aquatic Therapy, [phone]- Electrical stimulation (manual), 97016- Vasopneumatic device, 97035- Ultrasound, [id number]- Ionotophoresis 4mg /ml Dexamethasone , Patient/Family education, Balance training, Stair training, Taping, Dry Needling, Joint mobilization, Scar mobilization, Compression bandaging, DME instructions, Cryotherapy, and Moist heat  PLAN FOR NEXT SESSION: progress flexibility & ROM focus more on extension, introduce joint mobs, progress LE/core strengthening exercises    Dorothee Napierkowski L Kahmari Herard, PTA 10/10/2023, 11:44 AM     Date of referral: 08/29/23 Referring provider: Claiborne Crew, MD Referring diagnosis? M17.12 (ICD-10-CM) - Unilateral primary osteoarthritis, left knee  Treatment diagnosis? (if different than referring diagnosis)  Muscle weakness (generalized)  Acute pain of left knee  Stiffness of left knee, not elsewhere classified  Other abnormalities of gait and mobility  Localized edema  What was this (referring dx) caused by? Surgery (Type: L TKA)  Nature of Condition: Initial Onset (within last 3 months)   Laterality: Lt  Current Functional Measure Score: LEFS 17 / 80 = 21.3 %  Objective measurements identify impairments when they are compared to normal values, the uninvolved extremity, and prior level of function.  [x]  Yes  []  No  Objective assessment of functional ability: Moderate functional limitations   Briefly describe symptoms: L knee dull pain and swelling, limited and painful L knee flexion/extension ROM, B hip discomfort, B LE/core muscle weakness  How did symptoms start: After L TKA surgery on 09/23/23  Average pain intensity:  Last 24 hours: 6/10  Past week:  8/10  How often does the pt experience symptoms? Constantly  How much have the symptoms interfered with usual daily activities? Quite a bit  How has condition changed since care began at this facility? NA - initial visit  In general, how is the patients overall health? Very Good  Onset date: 09/23/23   BACK PAIN (STarT Back Screening Tool) - (When applicable):  Has your back pain spread down your leg(s) at sometime in the last 2 weeks? []  Yes   []  No Have you had pain in the shoulder or neck at sometime in the past 2 weeks? []  Yes   []  No Have you only walked short distances because of your back pain? []  Yes   []  No In the past 2 weeks, have you dressed more slowly than usual because of your back pain? []  Yes   []  No Do you think it is not really safe for person with a condition like yours to be physically active? []  Yes   []  No Have worrying  thoughts been going through your mind a lot of the time? []  Yes   []  No Do you feel that your back pain is terrible and it is never going to get any better? []  Yes   []  No In general, have you stopped enjoying all the things you usually enjoy? []  Yes   []  No Overall, how bothersome has your back pain been in the last 2 weeks? []  Not at all   []  Slightly     []  Moderate   []  Very much     []  Extremely

## 2023-10-13 ENCOUNTER — Encounter: Payer: Self-pay | Admitting: Physical Therapy

## 2023-10-13 ENCOUNTER — Ambulatory Visit: Admitting: Physical Therapy

## 2023-10-13 DIAGNOSIS — R2689 Other abnormalities of gait and mobility: Secondary | ICD-10-CM | POA: Diagnosis not present

## 2023-10-13 DIAGNOSIS — R600 Localized edema: Secondary | ICD-10-CM

## 2023-10-13 DIAGNOSIS — M25562 Pain in left knee: Secondary | ICD-10-CM

## 2023-10-13 DIAGNOSIS — M25662 Stiffness of left knee, not elsewhere classified: Secondary | ICD-10-CM

## 2023-10-13 DIAGNOSIS — M6281 Muscle weakness (generalized): Secondary | ICD-10-CM

## 2023-10-13 NOTE — Therapy (Addendum)
 OUTPATIENT PHYSICAL THERAPY LOWER EXTREMITY TREATMENT   Patient Name: Patricia Lee MRN: [id number] DOB:03/17/1946, 78 y.o., female Today's Date: 10/13/2023  END OF SESSION:  PT End of Session - 10/13/23 1101     Visit Number 7    Date for PT Re-Evaluation 11/24/23    Authorization Type [employer]    Authorization Time Period 09/20/23-11/24/23    Authorization - Visit Number 7    Authorization - Number of Visits 14    PT Start Time 1100    PT Stop Time 1147    PT Time Calculation (min) 47 min    Activity Tolerance Patient tolerated treatment well    Behavior During Therapy [facility] for tasks assessed/performed                   Past Medical History:  Diagnosis Date   Allergy    spring seasonal   Arthritis    hip    Borderline diabetes 12/06/2016   Hyperlipidemia 10/21/2016   Hypertension    Pneumonia 2019   Preventative health care 02/19/2022   Past Surgical History:  Procedure Laterality Date   COLONOSCOPY     JOINT REPLACEMENT  Right hip   POLYPECTOMY     TOTAL HIP ARTHROPLASTY Right 04/09/2016   Procedure: RIGHT TOTAL HIP ARTHROPLASTY ANTERIOR APPROACH;  Surgeon: Claiborne Crew, MD;  Location: [facility];  Service: Orthopedics;  Laterality: Right;   TOTAL KNEE ARTHROPLASTY Left 09/23/2023   Procedure: ARTHROPLASTY, KNEE, TOTAL;  Surgeon: Claiborne Crew, MD;  Location: [facility];  Service: Orthopedics;  Laterality: Left;   Patient Active Problem List   Diagnosis Date Noted   S/P total knee arthroplasty, left 09/23/2023   Pre-op evaluation 08/15/2023   Back pain 06/24/2023   Tinnitus 02/25/2023   COVID-19 01/07/2023   Hyponatremia 01/07/2023   Preventative health care 02/19/2022   Hyperglycemia 02/13/2021   Chronic left shoulder pain 02/13/2021   Borderline diabetes 12/06/2016   Hyperlipidemia 10/21/2016   Overweight (BMI 25.0-29.9) 04/10/2016   S/P right THA, 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   History of skin cancer 07/28/2012   HERPES ZOSTER 11/27/2008   PREMATURE  VENTRICULAR CONTRACTIONS 11/17/2008   Essential hypertension 09/10/2007    PCP: Dorrene Gaucher, NP  REFERRING PROVIDER: Claiborne Crew, MD  REFERRING DIAG: [phone] (ICD-10-CM) - Unilateral primary osteoarthritis, left knee   THERAPY DIAG:  Muscle weakness (generalized)  Acute pain of left knee  Stiffness of left knee, not elsewhere classified  Other abnormalities of gait and mobility  Localized edema  RATIONALE FOR EVALUATION AND TREATMENT: Rehabilitation  ONSET DATE: 09/23/23  NEXT MD VISIT: 11/14/23   SUBJECTIVE:   SUBJECTIVE STATEMENT: Reports no pain today but having tightness on medial and lateral sides of knee. No longer taking pain meds.   PERTINENT HISTORY: Borderline diabetes, hip arthritis, HTN PAIN:  Are you having pain? Yes: NPRS scale: )/10 worst Pain location: N/A Pain description:  N/A Aggravating factors: bending knee, standing too long, turning over in bed, keeping legs straight Relieving factors: not moving  PRECAUTIONS: Knee  RED FLAGS: None   WEIGHT BEARING RESTRICTIONS: No  FALLS:  Has patient fallen in last 6 months? No  LIVING ENVIRONMENT: Lives with: lives alone Lives in: House/apartment Stairs: Yes: Internal: 1 steps; none Has following equipment at home: Single point cane, Walker - 2 wheeled, and elevated toilet seat  OCCUPATION: Retired  PLOF: Independent: [employer] 3x/wk, walk in neighborhood, [employer] trips, walk 2 blocks  PATIENT GOALS: Be able to walk,  swim, go to the beach, walk in sand w/o any pain, get back to normal life   OBJECTIVE:  Note: Objective measures were completed at Evaluation unless otherwise noted.  DIAGNOSTIC FINDINGS:   PATIENT SURVEYS:  LEFS 17 / 80 = 21.3 %  COGNITION: Overall cognitive status: Within functional limits for tasks assessed     SENSATION: WFL  EDEMA:  Circumferential:   R = 41.0 cm at 6 cm above patella, 38.5 cm at mid-patella, 34.0 cm at 6 cm below patella L  = 46.5 cm at 6 cm above patella, 44.0 cm at mid-patella, 38.5 cm at 6 cm below patella  MUSCLE LENGTH: Hamstrings: mod tight R, mild tight  ITB: mild tight R Piriformis: NT Hip flexors: mild tight R  POSTURE: rounded shoulders, forward head, and flexed trunk   PALPATION:  TTP and pain in L lateral knee & posterior knee/popliteal region  LOWER EXTREMITY ROM:  Active ROM Right  eval Left  eval L 10/03/23 Supine AROM L 10/06/23 Supine AROM L  10/13/23  Hip flexion       Hip extension       Hip abduction       Hip adduction       Hip internal rotation       Hip external rotation       Knee flexion 110 81 85 102 103 seated / 106 supine  Knee extension -9 in LAQ,      0 supported -22 in LAQ,   -21 supported 13 9 9   Ankle dorsiflexion 4 4-     Ankle plantarflexion       Ankle inversion       Ankle eversion        (Blank rows = not tested)  LOWER EXTREMITY MMT:  MMT Right eval Left eval  Hip flexion    Hip extension 4 4- (tested in S/L)  Hip abduction 4 3+  Hip adduction 4 4-  Hip internal rotation    Hip external rotation    Knee flexion 4 3+ ^ dull  Knee extension 4 3+^ dull  Ankle dorsiflexion 4 4-   (Blank rows = not tested) ^: pain  FUNCTIONAL TESTS:  Timed up and go (TUG): 18.5 sec with RW (10/01/23)                                                                                                                           TREATMENT DATE:    10/13/23 THERAPEUTIC EXERCISE: To improve strength, endurance, ROM, and flexibility.  Demonstration, verbal and tactile cues throughout for technique. Recumbent Bike x 2 min partial rev; x 5 min full rev HS stretch w/ strap x 20"  Seated L knee extension stretch w/ chair - 30" Supine L ITB stretch w/ strap - felt slight pain & more at the hip than at the knee Standing L ITB stretch x 30" Prone L quad stretch 2 x 30" Prone hang - x 1 min  Roller massage L ITB Release  Seated L LAQ w/ 1lb - 2 x 10 B Fitter's Press L - 2 x 10 -  1 black, 1 blue Seated L leg press w/ RTB x 5; discontinued due to pull in quad then stretched & completed x 6 more  MANUAL THERAPY: To promote normalized muscle tension, improved flexibility, and reduced pain utilizing connective tissue massage. Side-lying L ITB foam roll - SPT rolling foam roller over ITB at knee  10/10/23 Therapeutic Exercise: to improve strength, ROM, flexibility, and endurance  Recumbent Bike x 2 min partial rev; x 4 min full rev Runner stretch 2 x 30"  Standing heel drop L gastroc stretch 2x30" 6' Prone hang x 1 min  NEUROMUSCULAR RE-EDUCATION: To improve proprioception, balance, and kinesthesia. Standing heel raises + TKE 2x10 Standing L TKE with ball on counter 10x5"; 2 sets Retro steps with arm raise x 10 with counter support; x 10 no support  Manual Therapy: to decrease muscle spasm, pain and improve mobility.  STM to L medial gatsroc and semitendinosus Supine knee extension stretch and mobilization with overpressure   Game Ready vasopneumatic compression post session to L knee x 10 min, med compression, 34 to reduce post-exercise pain and swelling/edema  10/08/23 Recumbent Bike x 4 min partial revolutions: 3 min full rev on seat #8 Reviewed gait with SBQC- adjusted AD height and position Standing heel/toe raises 2x10  Standing TKE with ball on counter 10x3"- cues to contract quads Retro step x 10 no support L runner stretch at counter 2x30" Supine knee extension towel under heel x 10 with gentle OP; 5x with gastroc strap stretch  Supine L SLR + QS 2x10 Supine AAROM heel slides using peanut ball 2x10 Game Ready vasopneumatic compression post session to L knee x 10 min, med compression, 34 to reduce post-exercise pain and swelling/edema   PATIENT EDUCATION:  Education details: HEP review, HEP update, and continue with current HEP  Person educated: Patient Education method: Explanation, Demonstration, Tactile cues, Verbal cues, and Handouts Education  comprehension: verbalized understanding, returned demonstration, verbal cues required, tactile cues required, and needs further education  HOME EXERCISE PROGRAM: Access Code: [email or web address] URL: [email or web address] Date: 10/13/2023 Prepared by: Zehra Rucci Joseph-Greene  Exercises - Supine Hamstring Stretch with Strap (Mirrored)  - 2-3 x daily - 7 x weekly - 3 reps - 30 sec hold - Quad Setting and Stretching  - 2-3 x daily - 7 x weekly - 2 sets - 10 reps - 3-5 sec hold - Seated Passive Knee Extension  - 2-3 x daily - 7 x weekly - 3 reps - 30-60 sec hold - [place]  - 1 x daily - 5-7 x weekly - 2 sets - 10 reps - Modified Thomas Stretch  - 1 x daily - 5-7 x weekly - 2 sets - 30 hold - Supine Heel Slide with Strap  - 1 x daily - 7 x weekly - 2 sets - 10 reps - Seated Long Arc Quad  - 1 x daily - 7 x weekly - 2 sets - 10 reps - Gastroc Stretch on Wall  - 1 x daily - 7 x weekly - 2 sets - 2 reps - 30 seconds hold - Terminal Knee Extension with Ball and Counter  - 1 x daily - 7 x weekly - 2-3 sets - 10 reps - 5 seconds hold - Prone Knee Extension Hang  - 1 x daily - 7 x weekly - 2 sets - 2 reps -  1 min hold - Prone Quadriceps Stretch with Strap  - 1 x daily - 7 x weekly - 2 sets - 30 hold - Roller Massage Elongated IT Band Release  - 1 x daily - 7 x weekly - 3 sets  ASSESSMENT:  CLINICAL IMPRESSION: Autie reports no pain and that HEP is going well. She did state that she had tightness on the medial and lateral part of L knee. Therefore, we started off the session with stretches to help relieve tension around the knee. We tried different variations of the ITB stretch, but only found relief when using the roller massage around the lateral part of her knee/thigh. We did HS stretches as well because she continues to experience tightness in posterior L leg. Today's session primarily focused on quad activation/knee extension because she continues to lack 9 degrees of L knee extension. Her L knee  flexion continues to improve as she got 103 in sitting and 106 in supine. Chante will benefit from continued skilled PT to address above deficits to improve mobility and activity tolerance with decreased pain, swelling and stiffness interference.  OBJECTIVE IMPAIRMENTS: Abnormal gait, decreased activity tolerance, decreased balance, decreased coordination, decreased endurance, decreased knowledge of condition, decreased knowledge of use of DME, decreased mobility, difficulty walking, decreased ROM, decreased strength, increased edema, increased fascial restrictions, impaired perceived functional ability, increased muscle spasms, impaired flexibility, impaired sensation, impaired tone, improper body mechanics, and pain.   ACTIVITY LIMITATIONS: lifting, bending, sitting, standing, squatting, sleeping, stairs, transfers, bed mobility, bathing, dressing, hygiene/grooming, and locomotion level  PARTICIPATION LIMITATIONS: meal prep, cleaning, laundry, driving, and community activity  PERSONAL FACTORS: Past/current experiences, Time since onset of injury/illness/exacerbation, and 3+ comorbidities: Borderline diabetes, hip arthritis, HTN  are also affecting patient's functional outcome.   REHAB POTENTIAL: Good  CLINICAL DECISION MAKING: Evolving/moderate complexity  EVALUATION COMPLEXITY: Moderate   GOALS: Goals reviewed with patient? Yes  SHORT TERM GOALS: Target date: 10/27/2023  1.  Pt will be independent with initial HEP Baseline:  Goal status: IN PROGRESS  2.  Patient will report at least 25% improvement in L knee pain to improve QOL. Baseline: 5/10 currently, 9.5/10 worst Goal status: IN PROGRESS  3.  Pt will report >/= 26/80 on LEFS (MCID = 9 pts) to demonstrate improved functional ability Baseline: 17 / 80 = 21.3 % Goal status: IN PROGRESS  4.  Pt will have improve L knee ROM to -10 to 90 to normalize gait pattern  Baseline: Refer to ROM table Goal status: MET- 10/06/23  5.  Pt  will be able to perform TUG <20 sec w/ AD  Baseline: 18.5 sec w/ RW Goal status: IN PROGRESS   LONG TERM GOALS: Target date: 11/24/2023   1.  Pt will be able to perform TUG <=13.5 sec w/o AD Baseline: 18.5 sec w/ RW Goal status: IN PROGRESS  2.  Pt will demonstrate improved L LE strength to >/= 4+/5 for improved stability and ease of mobility. Baseline: Refer to MMT table Goal status: IN PROGRESS  3.  Pt will report >/= 35/80 on LEFS (MCID = 9 pts) to demonstrate improved functional ability Baseline: 17 / 80 = 21.3 % Goal status: IN PROGRESS  4.  Pt will have improve L knee ROM to 0 to 110 to normalize gait pattern  Baseline:  Goal status: IN PROGRESS  5.  Pt will be able to ambulate 600" w/o AD with normal gait pattern to return to community walking Baseline:  Goal status: IN PROGRESS  6.  Pt will be able to sit or stand for 1hr w/o increased pain in L knee Baseline: Quite difficult to do  Goal status: IN PROGRESS   PLAN:  PT FREQUENCY: 2-3x/week - 3x/wk x 2 weeks, tapering to 2x/week for duration of POC   PT DURATION: 8 weeks  PLANNED INTERVENTIONS: 97164- PT Re-evaluation, 97750- Physical Performance Testing, 97110-Therapeutic exercises, 97530- Therapeutic activity, [id number]- Neuromuscular re-education, 97535- Self Care, 40981- Manual therapy, [id number]- Gait training, [phone]- Aquatic Therapy, [phone]- Electrical stimulation (manual), 97016- Vasopneumatic device, [id number]- Ultrasound, 21308- Ionotophoresis 4mg /ml Dexamethasone , Patient/Family education, Balance training, Stair training, Taping, Dry Needling, Joint mobilization, Scar mobilization, Compression bandaging, DME instructions, Cryotherapy, and Moist heat  PLAN FOR NEXT SESSION: progress flexibility & ROM focus more on extension/quad activation, introduce joint mobs, progress LE/core strengthening exercises    Dylin Ihnen Joseph-Greene, Student-PT 10/13/2023, 1:14 PM     Date of referral: 08/29/23 Referring provider: Claiborne Crew, MD Referring diagnosis? M17.12 (ICD-10-CM) - Unilateral primary osteoarthritis, left knee  Treatment diagnosis? (if different than referring diagnosis)  Muscle weakness (generalized)  Acute pain of left knee  Stiffness of left knee, not elsewhere classified  Other abnormalities of gait and mobility  Localized edema  What was this (referring dx) caused by? Surgery (Type: L TKA)  Nature of Condition: Initial Onset (within last 3 months)   Laterality: Lt  Current Functional Measure Score: LEFS 17 / 80 = 21.3 %  Objective measurements identify impairments when they are compared to normal values, the uninvolved extremity, and prior level of function.  [x]  Yes  []  No  Objective assessment of functional ability: Moderate functional limitations   Briefly describe symptoms: L knee dull pain and swelling, limited and painful L knee flexion/extension ROM, B hip discomfort, B LE/core muscle weakness  How did symptoms start: After L TKA surgery on 09/23/23  Average pain intensity:  Last 24 hours: 6/10  Past week: 8/10  How often does the pt experience symptoms? Constantly  How much have the symptoms interfered with usual daily activities? Quite a bit  How has condition changed since care began at this facility? NA - initial visit  In general, how is the patients overall health? Very Good  Onset date: 09/23/23   BACK PAIN (STarT Back Screening Tool) - (When applicable):  Has your back pain spread down your leg(s) at sometime in the last 2 weeks? []  Yes   []  No Have you had pain in the shoulder or neck at sometime in the past 2 weeks? []  Yes   []  No Have you only walked short distances because of your back pain? []  Yes   []  No In the past 2 weeks, have you dressed more slowly than usual because of your back pain? []  Yes   []  No Do you think it is not really safe for person with a condition like yours to be physically active? []  Yes   []  No Have worrying thoughts been  going through your mind a lot of the time? []  Yes   []  No Do you feel that your back pain is terrible and it is never going to get any better? []  Yes   []  No In general, have you stopped enjoying all the things you usually enjoy? []  Yes   []  No Overall, how bothersome has your back pain been in the last 2 weeks? []  Not at all   []  Slightly     []  Moderate   []  Very much     []   Extremely

## 2023-10-16 ENCOUNTER — Ambulatory Visit

## 2023-10-16 DIAGNOSIS — M6281 Muscle weakness (generalized): Secondary | ICD-10-CM

## 2023-10-16 DIAGNOSIS — M25562 Pain in left knee: Secondary | ICD-10-CM

## 2023-10-16 DIAGNOSIS — R2689 Other abnormalities of gait and mobility: Secondary | ICD-10-CM | POA: Diagnosis not present

## 2023-10-16 DIAGNOSIS — M25662 Stiffness of left knee, not elsewhere classified: Secondary | ICD-10-CM | POA: Diagnosis not present

## 2023-10-16 DIAGNOSIS — R600 Localized edema: Secondary | ICD-10-CM | POA: Diagnosis not present

## 2023-10-16 NOTE — Therapy (Signed)
 OUTPATIENT PHYSICAL THERAPY LOWER EXTREMITY TREATMENT   Patient Name: Patricia Lee MRN: [id number] DOB:06-May-1946, 78 y.o., female Today's Date: 10/16/2023  END OF SESSION:  PT End of Session - 10/16/23 1141     Visit Number 8    Date for PT Re-Evaluation 11/24/23    Authorization Type [employer]    Authorization Time Period 09/20/23-11/24/23    Authorization - Visit Number 8    Authorization - Number of Visits 14    PT Start Time 1055    PT Stop Time 1145    PT Time Calculation (min) 50 min    Activity Tolerance Patient tolerated treatment well    Behavior During Therapy [facility] for tasks assessed/performed                    Past Medical History:  Diagnosis Date   Allergy    spring seasonal   Arthritis    hip    Borderline diabetes 12/06/2016   Hyperlipidemia 10/21/2016   Hypertension    Pneumonia 2019   Preventative health care 02/19/2022   Past Surgical History:  Procedure Laterality Date   COLONOSCOPY     JOINT REPLACEMENT  Right hip   POLYPECTOMY     TOTAL HIP ARTHROPLASTY Right 04/09/2016   Procedure: RIGHT TOTAL HIP ARTHROPLASTY ANTERIOR APPROACH;  Surgeon: Claiborne Crew, MD;  Location: [facility];  Service: Orthopedics;  Laterality: Right;   TOTAL KNEE ARTHROPLASTY Left 09/23/2023   Procedure: ARTHROPLASTY, KNEE, TOTAL;  Surgeon: Claiborne Crew, MD;  Location: [facility];  Service: Orthopedics;  Laterality: Left;   Patient Active Problem List   Diagnosis Date Noted   S/P total knee arthroplasty, left 09/23/2023   Pre-op evaluation 08/15/2023   Back pain 06/24/2023   Tinnitus 02/25/2023   COVID-19 01/07/2023   Hyponatremia 01/07/2023   Preventative health care 02/19/2022   Hyperglycemia 02/13/2021   Chronic left shoulder pain 02/13/2021   Borderline diabetes 12/06/2016   Hyperlipidemia 10/21/2016   Overweight (BMI 25.0-29.9) 04/10/2016   S/P right THA, 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   History of skin cancer 07/28/2012   HERPES ZOSTER 11/27/2008    PREMATURE VENTRICULAR CONTRACTIONS 11/17/2008   Essential hypertension 09/10/2007    PCP: Dorrene Gaucher, NP  REFERRING PROVIDER: Claiborne Crew, MD  REFERRING DIAG: [phone] (ICD-10-CM) - Unilateral primary osteoarthritis, left knee   THERAPY DIAG:  Muscle weakness (generalized)  Acute pain of left knee  Stiffness of left knee, not elsewhere classified  Other abnormalities of gait and mobility  Localized edema  RATIONALE FOR EVALUATION AND TREATMENT: Rehabilitation  ONSET DATE: 09/23/23  NEXT MD VISIT: 11/14/23   SUBJECTIVE:   SUBJECTIVE STATEMENT: Pt reports she is continuing to do well but has concerns about not being able to sleep.    PERTINENT HISTORY: Borderline diabetes, hip arthritis, HTN PAIN:  Are you having pain? Yes: NPRS scale: 0/10 worst Pain location: N/A Pain description:  N/A Aggravating factors: bending knee, standing too long, turning over in bed, keeping legs straight Relieving factors: not moving  PRECAUTIONS: Knee  RED FLAGS: None   WEIGHT BEARING RESTRICTIONS: No  FALLS:  Has patient fallen in last 6 months? No  LIVING ENVIRONMENT: Lives with: lives alone Lives in: House/apartment Stairs: Yes: Internal: 1 steps; none Has following equipment at home: Single point cane, Walker - 2 wheeled, and elevated toilet seat  OCCUPATION: Retired  PLOF: Independent: [employer] 3x/wk, walk in neighborhood, [employer] trips, walk 2 blocks  PATIENT GOALS: Be able to walk,  swim, go to the beach, walk in sand w/o any pain, get back to normal life   OBJECTIVE:  Note: Objective measures were completed at Evaluation unless otherwise noted.  DIAGNOSTIC FINDINGS:   PATIENT SURVEYS:  LEFS 17 / 80 = 21.3 %  COGNITION: Overall cognitive status: Within functional limits for tasks assessed     SENSATION: WFL  EDEMA:  Circumferential:   R = 41.0 cm at 6 cm above patella, 38.5 cm at mid-patella, 34.0 cm at 6 cm below patella L = 46.5  cm at 6 cm above patella, 44.0 cm at mid-patella, 38.5 cm at 6 cm below patella  MUSCLE LENGTH: Hamstrings: mod tight R, mild tight  ITB: mild tight R Piriformis: NT Hip flexors: mild tight R  POSTURE: rounded shoulders, forward head, and flexed trunk   PALPATION:  TTP and pain in L lateral knee & posterior knee/popliteal region  LOWER EXTREMITY ROM:  Active ROM Right  eval Left  eval L 10/03/23 Supine AROM L 10/06/23 Supine AROM L  10/13/23  Hip flexion       Hip extension       Hip abduction       Hip adduction       Hip internal rotation       Hip external rotation       Knee flexion 110 81 85 102 103 seated / 106 supine  Knee extension -9 in LAQ,      0 supported -22 in LAQ,   -21 supported 13 9 9   Ankle dorsiflexion 4 4-     Ankle plantarflexion       Ankle inversion       Ankle eversion        (Blank rows = not tested)  LOWER EXTREMITY MMT:  MMT Right eval Left eval  Hip flexion    Hip extension 4 4- (tested in S/L)  Hip abduction 4 3+  Hip adduction 4 4-  Hip internal rotation    Hip external rotation    Knee flexion 4 3+ ^ dull  Knee extension 4 3+^ dull  Ankle dorsiflexion 4 4-   (Blank rows = not tested) ^: pain  FUNCTIONAL TESTS:  Timed up and go (TUG): 18.5 sec with RW (10/01/23)                                                                                                                           TREATMENT DATE:   10/16/23 THERAPEUTIC EXERCISE: To improve strength, endurance, ROM, and flexibility.  Demonstration, verbal and tactile cues throughout for technique. Bike L2x67min Prone L TKE 10x5" NMR: Standing toe raise back to counter 2x10 Retro gait 3x10 ff down and back counter Standing TKE with GTB 10x5" Lateral step ups LLE 4' x 10 Fwd step ups LLE 4' x 10 MANUAL THERAPY: To promote normalized muscle tension, improved flexibility, and reduced pain utilizing connective tissue massage. PA mobs for knee extension supine grade III-IV Knee  extension with overpressure STM to L medial hamstring tendon, adductors  with prolonged knee ext stretch 5 min post session moist heat to posterior knee 10/13/23 THERAPEUTIC EXERCISE: To improve strength, endurance, ROM, and flexibility.  Demonstration, verbal and tactile cues throughout for technique. Recumbent Bike x 2 min partial rev; x 5 min full rev HS stretch w/ strap x 20"  Seated L knee extension stretch w/ chair - 30" Supine L ITB stretch w/ strap - felt slight pain & more at the hip than at the knee Standing L ITB stretch x 30" Prone L quad stretch 2 x 30" Prone hang - x 1 min Roller massage L ITB Release  Seated L LAQ w/ 1lb - 2 x 10 B Fitter's Press L - 2 x 10 - 1 black, 1 blue Seated L leg press w/ RTB x 5; discontinued due to pull in quad then stretched & completed x 6 more  MANUAL THERAPY: To promote normalized muscle tension, improved flexibility, and reduced pain utilizing connective tissue massage. Side-lying L ITB foam roll - SPT rolling foam roller over ITB at knee  10/10/23 Therapeutic Exercise: to improve strength, ROM, flexibility, and endurance  Recumbent Bike x 2 min partial rev; x 4 min full rev Runner stretch 2 x 30"  Standing heel drop L gastroc stretch 2x30" 6' Prone hang x 1 min  NEUROMUSCULAR RE-EDUCATION: To improve proprioception, balance, and kinesthesia. Standing heel raises + TKE 2x10 Standing L TKE with ball on counter 10x5"; 2 sets Retro steps with arm raise x 10 with counter support; x 10 no support  Manual Therapy: to decrease muscle spasm, pain and improve mobility.  STM to L medial gatsroc and semitendinosus Supine knee extension stretch and mobilization with overpressure   Game Ready vasopneumatic compression post session to L knee x 10 min, med compression, 34 to reduce post-exercise pain and swelling/edema  10/08/23 Recumbent Bike x 4 min partial revolutions: 3 min full rev on seat #8 Reviewed gait with SBQC- adjusted AD height and  position Standing heel/toe raises 2x10  Standing TKE with ball on counter 10x3"- cues to contract quads Retro step x 10 no support L runner stretch at counter 2x30" Supine knee extension towel under heel x 10 with gentle OP; 5x with gastroc strap stretch  Supine L SLR + QS 2x10 Supine AAROM heel slides using peanut ball 2x10 Game Ready vasopneumatic compression post session to L knee x 10 min, med compression, 34 to reduce post-exercise pain and swelling/edema   PATIENT EDUCATION:  Education details: HEP review, HEP update, and continue with current HEP  Person educated: Patient Education method: Explanation, Demonstration, Tactile cues, Verbal cues, and Handouts Education comprehension: verbalized understanding, returned demonstration, verbal cues required, tactile cues required, and needs further education  HOME EXERCISE PROGRAM: Access Code: [email or web address] URL: [email or web address] Date: 10/13/2023 Prepared by: Talia Joseph-Greene  Exercises - Supine Hamstring Stretch with Strap (Mirrored)  - 2-3 x daily - 7 x weekly - 3 reps - 30 sec hold - Quad Setting and Stretching  - 2-3 x daily - 7 x weekly - 2 sets - 10 reps - 3-5 sec hold - Seated Passive Knee Extension  - 2-3 x daily - 7 x weekly - 3 reps - 30-60 sec hold - [place]  - 1 x daily - 5-7 x weekly - 2 sets - 10 reps - Modified Thomas Stretch  - 1 x daily - 5-7 x weekly - 2 sets - 30 hold - Supine Heel Slide  with Strap  - 1 x daily - 7 x weekly - 2 sets - 10 reps - Seated Long Arc Quad  - 1 x daily - 7 x weekly - 2 sets - 10 reps - Gastroc Stretch on Wall  - 1 x daily - 7 x weekly - 2 sets - 2 reps - 30 seconds hold - Terminal Knee Extension with Ball and Counter  - 1 x daily - 7 x weekly - 2-3 sets - 10 reps - 5 seconds hold - Prone Knee Extension Hang  - 1 x daily - 7 x weekly - 2 sets - 2 reps - 1 min hold - Prone Quadriceps Stretch with Strap  - 1 x daily - 7 x weekly - 2 sets - 30 hold - Roller Massage  Elongated IT Band Release  - 1 x daily - 7 x weekly - 3 sets  ASSESSMENT:  CLINICAL IMPRESSION: Pt continues to be limited with knee extension ROM although flexion coming along really well. Progressed with mostly standing involuntary quad control interventions and initiated stair climbing exercises. She achieved 8 degrees of extension post visit. Ended session with moist heat to L hamstrings and posterior knee. [place] will benefit from continued skilled PT to address above deficits to improve mobility and activity tolerance with decreased pain, swelling and stiffness interference.  OBJECTIVE IMPAIRMENTS: Abnormal gait, decreased activity tolerance, decreased balance, decreased coordination, decreased endurance, decreased knowledge of condition, decreased knowledge of use of DME, decreased mobility, difficulty walking, decreased ROM, decreased strength, increased edema, increased fascial restrictions, impaired perceived functional ability, increased muscle spasms, impaired flexibility, impaired sensation, impaired tone, improper body mechanics, and pain.   ACTIVITY LIMITATIONS: lifting, bending, sitting, standing, squatting, sleeping, stairs, transfers, bed mobility, bathing, dressing, hygiene/grooming, and locomotion level  PARTICIPATION LIMITATIONS: meal prep, cleaning, laundry, driving, and community activity  PERSONAL FACTORS: Past/current experiences, Time since onset of injury/illness/exacerbation, and 3+ comorbidities: Borderline diabetes, hip arthritis, HTN are also affecting patient's functional outcome.   REHAB POTENTIAL: Good  CLINICAL DECISION MAKING: Evolving/moderate complexity  EVALUATION COMPLEXITY: Moderate   GOALS: Goals reviewed with patient? Yes  SHORT TERM GOALS: Target date: 10/27/2023  1.  Pt will be independent with initial HEP Baseline:  Goal status: IN PROGRESS  2.  Patient will report at least 25% improvement in L knee pain to improve QOL. Baseline: 5/10  currently, 9.5/10 worst Goal status: MET- 10/16/23 no pain the past few sessions  3.  Pt will report >/= 26/80 on LEFS (MCID = 9 pts) to demonstrate improved functional ability Baseline: 17 / 80 = 21.3 % Goal status: IN PROGRESS  4.  Pt will have improve L knee ROM to -10 to 90 to normalize gait pattern  Baseline: Refer to ROM table Goal status: MET- 10/06/23  5.  Pt will be able to perform TUG <20 sec w/ AD  Baseline: 18.5 sec w/ RW Goal status: IN PROGRESS   LONG TERM GOALS: Target date: 11/24/2023   1.  Pt will be able to perform TUG <=13.5 sec w/o AD Baseline: 18.5 sec w/ RW Goal status: IN PROGRESS  2.  Pt will demonstrate improved L LE strength to >/= 4+/5 for improved stability and ease of mobility. Baseline: Refer to MMT table Goal status: IN PROGRESS  3.  Pt will report >/= 35/80 on LEFS (MCID = 9 pts) to demonstrate improved functional ability Baseline: 17 / 80 = 21.3 % Goal status: IN PROGRESS  4.  Pt will have improve L knee  ROM to 0 to 110 to normalize gait pattern  Baseline:  Goal status: IN PROGRESS  5.  Pt will be able to ambulate 600" w/o AD with normal gait pattern to return to community walking Baseline:  Goal status: IN PROGRESS  6.  Pt will be able to sit or stand for 1hr w/o increased pain in L knee Baseline: Quite difficult to do  Goal status: IN PROGRESS   PLAN:  PT FREQUENCY: 2-3x/week - 3x/wk x 2 weeks, tapering to 2x/week for duration of POC   PT DURATION: 8 weeks  PLANNED INTERVENTIONS: 97164- PT Re-evaluation, 97750- Physical Performance Testing, 97110-Therapeutic exercises, 97530- Therapeutic activity, [id number]- Neuromuscular re-education, 97535- Self Care, 52778- Manual therapy, [id number]- Gait training, [phone]- Aquatic Therapy, [phone]- Electrical stimulation (manual), 97016- Vasopneumatic device, 97035- Ultrasound, 31540- Ionotophoresis 4mg /ml Dexamethasone , Patient/Family education, Balance training, Stair training, Taping, Dry Needling, Joint  mobilization, Scar mobilization, Compression bandaging, DME instructions, Cryotherapy, and Moist heat  PLAN FOR NEXT SESSION: progress flexibility & ROM focus more on extension/quad activation, introduce joint mobs, progress LE/core strengthening exercises    Farrah Skoda L Jerusalem Brownstein, PTA 10/16/2023, 11:41 AM     Date of referral: 08/29/23 Referring provider: Claiborne Crew, MD Referring diagnosis? M17.12 (ICD-10-CM) - Unilateral primary osteoarthritis, left knee  Treatment diagnosis? (if different than referring diagnosis)  Muscle weakness (generalized)  Acute pain of left knee  Stiffness of left knee, not elsewhere classified  Other abnormalities of gait and mobility  Localized edema  What was this (referring dx) caused by? Surgery (Type: L TKA)  Nature of Condition: Initial Onset (within last 3 months)   Laterality: Lt  Current Functional Measure Score: LEFS 17 / 80 = 21.3 %  Objective measurements identify impairments when they are compared to normal values, the uninvolved extremity, and prior level of function.  [x]  Yes  []  No  Objective assessment of functional ability: Moderate functional limitations   Briefly describe symptoms: L knee dull pain and swelling, limited and painful L knee flexion/extension ROM, B hip discomfort, B LE/core muscle weakness  How did symptoms start: After L TKA surgery on 09/23/23  Average pain intensity:  Last 24 hours: 6/10  Past week: 8/10  How often does the pt experience symptoms? Constantly  How much have the symptoms interfered with usual daily activities? Quite a bit  How has condition changed since care began at this facility? NA - initial visit  In general, how is the patients overall health? Very Good  Onset date: 09/23/23   BACK PAIN (STarT Back Screening Tool) - (When applicable):  Has your back pain spread down your leg(s) at sometime in the last 2 weeks? []  Yes   []  No Have you had pain in the shoulder or neck at sometime  in the past 2 weeks? []  Yes   []  No Have you only walked short distances because of your back pain? []  Yes   []  No In the past 2 weeks, have you dressed more slowly than usual because of your back pain? []  Yes   []  No Do you think it is not really safe for person with a condition like yours to be physically active? []  Yes   []  No Have worrying thoughts been going through your mind a lot of the time? []  Yes   []  No Do you feel that your back pain is terrible and it is never going to get any better? []  Yes   []  No In general, have you stopped enjoying all the things  you usually enjoy? []  Yes   []  No Overall, how bothersome has your back pain been in the last 2 weeks? []  Not at all   []  Slightly     []  Moderate   []  Very much     []  Extremely

## 2023-10-20 ENCOUNTER — Encounter: Payer: Self-pay | Admitting: Physical Therapy

## 2023-10-20 ENCOUNTER — Ambulatory Visit: Admitting: Physical Therapy

## 2023-10-20 DIAGNOSIS — R2689 Other abnormalities of gait and mobility: Secondary | ICD-10-CM

## 2023-10-20 DIAGNOSIS — M25562 Pain in left knee: Secondary | ICD-10-CM | POA: Diagnosis not present

## 2023-10-20 DIAGNOSIS — R600 Localized edema: Secondary | ICD-10-CM

## 2023-10-20 DIAGNOSIS — M6281 Muscle weakness (generalized): Secondary | ICD-10-CM

## 2023-10-20 DIAGNOSIS — M25662 Stiffness of left knee, not elsewhere classified: Secondary | ICD-10-CM

## 2023-10-20 NOTE — Therapy (Signed)
 OUTPATIENT PHYSICAL THERAPY TREATMENT   Patient Name: Patricia Lee MRN: [id number] DOB:1945/12/09, 78 y.o., female Today's Date: 10/20/2023  END OF SESSION:  PT End of Session - 10/20/23 1102     Visit Number 9    Date for PT Re-Evaluation 11/24/23    Authorization Type [employer]    Authorization Time Period 09/20/23-11/24/23    Authorization - Visit Number 9    Authorization - Number of Visits 14    PT Start Time 1102    PT Stop Time 1157    PT Time Calculation (min) 55 min    Activity Tolerance Patient tolerated treatment well    Behavior During Therapy [facility] for tasks assessed/performed                     Past Medical History:  Diagnosis Date   Allergy    spring seasonal   Arthritis    hip    Borderline diabetes 12/06/2016   Hyperlipidemia 10/21/2016   Hypertension    Pneumonia 2019   Preventative health care 02/19/2022   Past Surgical History:  Procedure Laterality Date   COLONOSCOPY     JOINT REPLACEMENT  Right hip   POLYPECTOMY     TOTAL HIP ARTHROPLASTY Right 04/09/2016   Procedure: RIGHT TOTAL HIP ARTHROPLASTY ANTERIOR APPROACH;  Surgeon: Claiborne Crew, MD;  Location: [facility];  Service: Orthopedics;  Laterality: Right;   TOTAL KNEE ARTHROPLASTY Left 09/23/2023   Procedure: ARTHROPLASTY, KNEE, TOTAL;  Surgeon: Claiborne Crew, MD;  Location: [facility];  Service: Orthopedics;  Laterality: Left;   Patient Active Problem List   Diagnosis Date Noted   S/P total knee arthroplasty, left 09/23/2023   Pre-op evaluation 08/15/2023   Back pain 06/24/2023   Tinnitus 02/25/2023   COVID-19 01/07/2023   Hyponatremia 01/07/2023   Preventative health care 02/19/2022   Hyperglycemia 02/13/2021   Chronic left shoulder pain 02/13/2021   Borderline diabetes 12/06/2016   Hyperlipidemia 10/21/2016   Overweight (BMI 25.0-29.9) 04/10/2016   S/P right THA, 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   History of skin cancer 07/28/2012   HERPES ZOSTER 11/27/2008   PREMATURE  VENTRICULAR CONTRACTIONS 11/17/2008   Essential hypertension 09/10/2007    PCP: Dorrene Gaucher, NP  REFERRING PROVIDER: Claiborne Crew, MD  REFERRING DIAG: [id number] (ICD-10-CM) - Unilateral primary osteoarthritis, left knee   THERAPY DIAG:  Muscle weakness (generalized)  Acute pain of left knee  Stiffness of left knee, not elsewhere classified  Other abnormalities of gait and mobility  Localized edema  RATIONALE FOR EVALUATION AND TREATMENT: Rehabilitation  ONSET DATE: 09/23/23  NEXT MD VISIT: 11/14/23   SUBJECTIVE:   SUBJECTIVE STATEMENT: Pt reports no pain, just stiffness.  Not using her cane most of the time but still takes it when she goes out in case she has to walk a longer distance.  PERTINENT HISTORY: Borderline diabetes, hip arthritis, HTN PAIN:  Are you having pain? Yes: NPRS scale: 0/10  Pain location: N/A Pain description:  N/A Aggravating factors: bending knee, standing too long, turning over in bed, keeping legs straight Relieving factors: not moving  PRECAUTIONS: Knee  RED FLAGS: None   WEIGHT BEARING RESTRICTIONS: No  FALLS:  Has patient fallen in last 6 months? No  LIVING ENVIRONMENT: Lives with: lives alone Lives in: House/apartment Stairs: Yes: Internal: 1 steps; none Has following equipment at home: Single point cane, Walker - 2 wheeled, and elevated toilet seat  OCCUPATION: Retired  PLOF: Independent: [employer] 3x/wk, walk in neighborhood, [employer] trips, walk 2 blocks  PATIENT GOALS: Be able to walk, swim, go to the beach, walk in sand w/o any pain, get back to normal life   OBJECTIVE:  Note: Objective measures were completed at Evaluation unless otherwise noted.  DIAGNOSTIC FINDINGS:   PATIENT SURVEYS:  LEFS 17 / 80 = 21.3 %  COGNITION: Overall cognitive status: Within functional limits for tasks assessed     SENSATION: WFL  EDEMA:  Circumferential:   R = 41.0 cm at 6 cm above patella, 38.5 cm at  mid-patella, 34.0 cm at 6 cm below patella L = 46.5 cm at 6 cm above patella, 44.0 cm at mid-patella, 38.5 cm at 6 cm below patella  MUSCLE LENGTH: Hamstrings: mod tight R, mild tight  ITB: mild tight R Piriformis: NT Hip flexors: mild tight R  POSTURE: rounded shoulders, forward head, and flexed trunk   PALPATION:  TTP and pain in L lateral knee & posterior knee/popliteal region  LOWER EXTREMITY ROM:  Active ROM Right eval Left eval L 10/03/23 Supine  L 10/06/23 Supine  L 10/13/23 L 10/20/23   Knee flexion 110 81 85 102 103 seated / 106 supine 110   Knee extension -9 in LAQ,      0 supported -22 in LAQ,   -21 supported 13 9 9 6  supine, 11 LAQ   (Blank rows = not tested)  LOWER EXTREMITY MMT:  MMT Right eval Left eval  Hip flexion    Hip extension 4 4- (tested in S/L)  Hip abduction 4 3+  Hip adduction 4 4-  Hip internal rotation    Hip external rotation    Knee flexion 4 3+ ^ dull  Knee extension 4 3+^ dull  Ankle dorsiflexion 4 4-   (Blank rows = not tested) ^: pain  FUNCTIONAL TESTS:  Timed up and go (TUG): 18.5 sec with RW (10/01/23)                                                                                                                           TREATMENT DATE:    10/20/23 THERAPEUTIC EXERCISE: To improve strength, endurance, ROM, and flexibility.  Demonstration, verbal and tactile cues throughout for technique.  Rec Bike - L3 x 8 min Prone hang x 4 min Prone L TKE 10 x 3-5" Supine leg lengthener + quad set with heel on 6" FR 12 x 3", 2 sets Gravity assisted L knee flexion with L LE supported over PT arms, progressing to contract/relax into L knee flexion PROM Seated LAQ x 10 BATCA leg press 20# 2 x 10 BATCA knee flexion 15# B LE con/ecc x 10, 10# B con/L ecc x 10 BATCA knee extension 5# B LE con/ecc x 10  THERAPEUTIC ACTIVITIES: To improve functional performance.  Demonstration, verbal and tactile cues throughout for technique. TUG: 9.94 sec w/o  AD  MANUAL THERAPY: To promote normalized muscle tension, improved flexibility, improved joint mobility, increased ROM, and reduced pain  utilizing joint mobilization, connective tissue massage, and therapeutic massage.  STM/DTM and IASTM with roller stick and edge tool to HS and ITB in prone hang position L LE longitudinal distraction in prone hang position L knee tibiofemoral P/A mobs in prone hang position to promote L knee extension L knee femorotibial A/P mobs in supine with FR behind calf to promote L knee extension  NEUROMUSCULAR RE-EDUCATION: To improve coordination, kinesthesia, and proprioception. Standing blue TB TKE 10 x 5", 2 sets  MODALITIES: Moist heat to posterior knee with heel propped on FR for gentle extension stretch x 6 min   10/16/23 THERAPEUTIC EXERCISE: To improve strength, endurance, ROM, and flexibility.  Demonstration, verbal and tactile cues throughout for technique. Bike L2x35min Prone L TKE 10x5" NMR: Standing toe raise back to counter 2x10 Retro gait 3x10 ff down and back counter Standing TKE with GTB 10x5" Lateral step ups LLE 4' x 10 Fwd step ups LLE 4' x 10 MANUAL THERAPY: To promote normalized muscle tension, improved flexibility, and reduced pain utilizing connective tissue massage. PA mobs for knee extension supine grade III-IV Knee extension with overpressure STM to L medial hamstring tendon, adductors  with prolonged knee ext stretch 5 min post session moist heat to posterior knee   10/13/23 THERAPEUTIC EXERCISE: To improve strength, endurance, ROM, and flexibility.  Demonstration, verbal and tactile cues throughout for technique. Recumbent Bike x 2 min partial rev; x 5 min full rev HS stretch w/ strap x 20"  Seated L knee extension stretch w/ chair - 30" Supine L ITB stretch w/ strap - felt slight pain & more at the hip than at the knee Standing L ITB stretch x 30" Prone L quad stretch 2 x 30" Prone hang - x 1 min Roller massage L ITB Release   Seated L LAQ w/ 1lb - 2 x 10 B Fitter's Press L - 2 x 10 - 1 black, 1 blue Seated L leg press w/ RTB x 5; discontinued due to pull in quad then stretched & completed x 6 more  MANUAL THERAPY: To promote normalized muscle tension, improved flexibility, and reduced pain utilizing connective tissue massage. Side-lying L ITB foam roll - SPT rolling foam roller over ITB at knee    PATIENT EDUCATION:  Education details: HEP progression - blue TB TKE  Person educated: Patient Education method: Explanation, Demonstration, Tactile cues, Verbal cues, and Handouts Education comprehension: verbalized understanding, returned demonstration, verbal cues required, tactile cues required, and needs further education  HOME EXERCISE PROGRAM: Access Code: [email or web address] URL: [email or web address] Date: 10/20/2023 Prepared by: Felecia Hopper  Exercises - Supine Hamstring Stretch with Strap (Mirrored)  - 2-3 x daily - 7 x weekly - 3 reps - 30 sec hold - Quad Setting and Stretching  - 2-3 x daily - 7 x weekly - 2 sets - 10 reps - 3-5 sec hold - Seated Passive Knee Extension  - 2-3 x daily - 7 x weekly - 3 reps - 30-60 sec hold - [place]  - 1 x daily - 5-7 x weekly - 2 sets - 10 reps - Modified Thomas Stretch  - 1 x daily - 5-7 x weekly - 2 sets - 30 hold - Supine Heel Slide with Strap  - 1 x daily - 7 x weekly - 2 sets - 10 reps - Seated Long Arc Quad  - 1 x daily - 7 x weekly - 2 sets - 10 reps - Gastroc Stretch on Wall  - 1 x daily -  7 x weekly - 2 sets - 2 reps - 30 seconds hold - Terminal Knee Extension with Ball and Counter  - 1 x daily - 7 x weekly - 2-3 sets - 10 reps - 5 seconds hold - Prone Knee Extension Hang  - 1 x daily - 7 x weekly - 2 sets - 2 reps - 1 min hold - Prone Quadriceps Stretch with Strap  - 1 x daily - 7 x weekly - 2 sets - 30 hold - Roller Massage Elongated IT Band Release  - 1 x daily - 7 x weekly - 3 sets - Standing Terminal Knee Extension with Resistance  - 1 x daily  - 7 x weekly - 2 sets - 10 reps - 5 sec hold  ASSESSMENT:  CLINICAL IMPRESSION: Patricia Lee reports decreasing reliance on her SBQC with ambulation, only using it when walking longer distances.  She continues to be limited with L knee extension with MT and therapeutic exercises focusing on promoting increased A/PROM into knee extension today with pt able to achieve 6-110 L knee AROM in supine, while 11 still lacking in seated LAQ.  Patricia Lee will benefit from continued skilled PT to address ongoing ROM and strength deficits to improve mobility and activity tolerance with decreased pain, swelling and stiffness interference.  OBJECTIVE IMPAIRMENTS: Abnormal gait, decreased activity tolerance, decreased balance, decreased coordination, decreased endurance, decreased knowledge of condition, decreased knowledge of use of DME, decreased mobility, difficulty walking, decreased ROM, decreased strength, increased edema, increased fascial restrictions, impaired perceived functional ability, increased muscle spasms, impaired flexibility, impaired sensation, impaired tone, improper body mechanics, and pain.   ACTIVITY LIMITATIONS: lifting, bending, sitting, standing, squatting, sleeping, stairs, transfers, bed mobility, bathing, dressing, hygiene/grooming, and locomotion level  PARTICIPATION LIMITATIONS: meal prep, cleaning, laundry, driving, and community activity  PERSONAL FACTORS: Past/current experiences, Time since onset of injury/illness/exacerbation, and 3+ comorbidities: Borderline diabetes, hip arthritis, HTN are also affecting patient's functional outcome.   REHAB POTENTIAL: Good  CLINICAL DECISION MAKING: Evolving/moderate complexity  EVALUATION COMPLEXITY: Moderate   GOALS: Goals reviewed with patient? Yes  SHORT TERM GOALS: Target date: 10/27/2023  1.  Pt will be independent with initial HEP Baseline:  Goal status: MET - 10/20/23  2.  Patient will report at least 25% improvement in L knee pain  to improve QOL. Baseline: 5/10 currently, 9.5/10 worst Goal status: MET - 10/16/23 - no pain the past few sessions  3.  Pt will report >/= 26/80 on LEFS (MCID = 9 pts) to demonstrate improved functional ability Baseline: 17 / 80 = 21.3 % Goal status: IN PROGRESS  4.  Pt will have improve L knee ROM to 10 to 90 to normalize gait pattern  Baseline: Refer to ROM table Goal status: MET - 10/06/23  5.  Pt will be able to perform TUG <20 sec w/ AD  Baseline: 18.5 sec w/ RW Goal status: MET - 10/20/23 - 9.94 sec w/o AD   LONG TERM GOALS: Target date: 11/24/2023   1.  Pt will be able to perform TUG <=13.5 sec w/o AD Baseline: 18.5 sec w/ RW Goal status: MET - 10/20/23 - 9.94 sec w/o AD  2.  Pt will demonstrate improved L LE strength to >/= 4+/5 for improved stability and ease of mobility. Baseline: Refer to MMT table Goal status: IN PROGRESS  3.  Pt will report >/= 35/80 on LEFS (MCID = 9 pts) to demonstrate improved functional ability Baseline: 17 / 80 = 21.3 % Goal status: IN PROGRESS  4.  Pt will have improve L knee ROM to 0 to 110 to normalize gait pattern  Baseline:  Goal status: IN PROGRESS - 10/20/23 - L knee AROM 6-110 in supine, 11 L knee extension still lacking in seated LAQ  5.  Pt will be able to ambulate 600" w/o AD with normal gait pattern to return to community walking Baseline:  Goal status: IN PROGRESS  6.  Pt will be able to sit or stand for 1hr w/o increased pain in L knee Baseline: Quite difficult to do  Goal status: IN PROGRESS   PLAN:  PT FREQUENCY: 2-3x/week - 3x/wk x 2 weeks, tapering to 2x/week for duration of POC   PT DURATION: 8 weeks  PLANNED INTERVENTIONS: 97164- PT Re-evaluation, 97750- Physical Performance Testing, 97110-Therapeutic exercises, 97530- Therapeutic activity, [id number]- Neuromuscular re-education, 97535- Self Care, 91478- Manual therapy, [id number]- Gait training, [phone]- Aquatic Therapy, [phone]- Electrical stimulation (manual), 97016-  Vasopneumatic device, 97035- Ultrasound, [id number]- Ionotophoresis 4mg /ml Dexamethasone , Patient/Family education, Balance training, Stair training, Taping, Dry Needling, Joint mobilization, Scar mobilization, Compression bandaging, DME instructions, Cryotherapy, and Moist heat  PLAN FOR NEXT SESSION: progress flexibility & ROM focus more on extension/quad activation, introduce joint mobs, progress LE/core strengthening exercises    Francisco Irving, PT 10/20/2023, 12:03 PM     Date of referral: 08/29/23 Referring provider: Claiborne Crew, MD Referring diagnosis? M17.12 (ICD-10-CM) - Unilateral primary osteoarthritis, left knee  Treatment diagnosis? (if different than referring diagnosis)  Muscle weakness (generalized)  Acute pain of left knee  Stiffness of left knee, not elsewhere classified  Other abnormalities of gait and mobility  Localized edema  What was this (referring dx) caused by? Surgery (Type: L TKA)  Nature of Condition: Initial Onset (within last 3 months)   Laterality: Lt  Current Functional Measure Score: LEFS 17 / 80 = 21.3 %  Objective measurements identify impairments when they are compared to normal values, the uninvolved extremity, and prior level of function.  [x]  Yes  []  No  Objective assessment of functional ability: Moderate functional limitations   Briefly describe symptoms: L knee dull pain and swelling, limited and painful L knee flexion/extension ROM, B hip discomfort, B LE/core muscle weakness  How did symptoms start: After L TKA surgery on 09/23/23  Average pain intensity:  Last 24 hours: 6/10  Past week: 8/10  How often does the pt experience symptoms? Constantly  How much have the symptoms interfered with usual daily activities? Quite a bit  How has condition changed since care began at this facility? NA - initial visit  In general, how is the patients overall health? Very Good  Onset date: 09/23/23   BACK PAIN (STarT Back Screening  Tool) - (When applicable):  Has your back pain spread down your leg(s) at sometime in the last 2 weeks? []  Yes   []  No Have you had pain in the shoulder or neck at sometime in the past 2 weeks? []  Yes   []  No Have you only walked short distances because of your back pain? []  Yes   []  No In the past 2 weeks, have you dressed more slowly than usual because of your back pain? []  Yes   []  No Do you think it is not really safe for person with a condition like yours to be physically active? []  Yes   []  No Have worrying thoughts been going through your mind a lot of the time? []  Yes   []  No Do you feel that your back pain  is terrible and it is never going to get any better? []  Yes   []  No In general, have you stopped enjoying all the things you usually enjoy? []  Yes   []  No Overall, how bothersome has your back pain been in the last 2 weeks? []  Not at all   []  Slightly     []  Moderate   []  Very much     []  Extremely

## 2023-10-23 ENCOUNTER — Ambulatory Visit

## 2023-10-23 DIAGNOSIS — M25662 Stiffness of left knee, not elsewhere classified: Secondary | ICD-10-CM | POA: Diagnosis not present

## 2023-10-23 DIAGNOSIS — M25562 Pain in left knee: Secondary | ICD-10-CM

## 2023-10-23 DIAGNOSIS — R2689 Other abnormalities of gait and mobility: Secondary | ICD-10-CM | POA: Diagnosis not present

## 2023-10-23 DIAGNOSIS — M6281 Muscle weakness (generalized): Secondary | ICD-10-CM

## 2023-10-23 DIAGNOSIS — R600 Localized edema: Secondary | ICD-10-CM | POA: Diagnosis not present

## 2023-10-23 NOTE — Therapy (Addendum)
 OUTPATIENT PHYSICAL THERAPY TREATMENT  Progress Note  Reporting Period 09/29/2023 to 10/23/2023   See note below for Objective Data and Assessment of Progress/Goals.     Patient Name: Patricia Lee MRN: [id number] DOB:Nov 02, 1945, 78 y.o., female Today's Date: 10/23/2023  END OF SESSION:  PT End of Session - 10/23/23 1201     Visit Number 10    Date for PT Re-Evaluation 11/24/23    Authorization Type [employer]    Authorization Time Period 09/20/23-11/24/23    Authorization - Visit Number 10    Authorization - Number of Visits 14    PT Start Time 1104    PT Stop Time 1147    PT Time Calculation (min) 43 min    Activity Tolerance Patient tolerated treatment well    Behavior During Therapy [facility] for tasks assessed/performed                      Past Medical History:  Diagnosis Date   Allergy    spring seasonal   Arthritis    hip    Borderline diabetes 12/06/2016   Hyperlipidemia 10/21/2016   Hypertension    Pneumonia 2019   Preventative health care 02/19/2022   Past Surgical History:  Procedure Laterality Date   COLONOSCOPY     JOINT REPLACEMENT  Right hip   POLYPECTOMY     TOTAL HIP ARTHROPLASTY Right 04/09/2016   Procedure: RIGHT TOTAL HIP ARTHROPLASTY ANTERIOR APPROACH;  Surgeon: Claiborne Crew, MD;  Location: [facility];  Service: Orthopedics;  Laterality: Right;   TOTAL KNEE ARTHROPLASTY Left 09/23/2023   Procedure: ARTHROPLASTY, KNEE, TOTAL;  Surgeon: Claiborne Crew, MD;  Location: [facility];  Service: Orthopedics;  Laterality: Left;   Patient Active Problem List   Diagnosis Date Noted   S/P total knee arthroplasty, left 09/23/2023   Pre-op evaluation 08/15/2023   Back pain 06/24/2023   Tinnitus 02/25/2023   COVID-19 01/07/2023   Hyponatremia 01/07/2023   Preventative health care 02/19/2022   Hyperglycemia 02/13/2021   Chronic left shoulder pain 02/13/2021   Borderline diabetes 12/06/2016   Hyperlipidemia 10/21/2016   Overweight (BMI  25.0-29.9) 04/10/2016   S/P right THA, 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   History of skin cancer 07/28/2012   HERPES ZOSTER 11/27/2008   PREMATURE VENTRICULAR CONTRACTIONS 11/17/2008   Essential hypertension 09/10/2007    PCP: Dorrene Gaucher, NP  REFERRING PROVIDER: Claiborne Crew, MD  REFERRING DIAG: [phone] (ICD-10-CM) - Unilateral primary osteoarthritis, left knee   THERAPY DIAG:  Muscle weakness (generalized)  Acute pain of left knee  Stiffness of left knee, not elsewhere classified  Other abnormalities of gait and mobility  Localized edema  RATIONALE FOR EVALUATION AND TREATMENT: Rehabilitation  ONSET DATE: 09/23/23 - L TKA  NEXT MD VISIT: 11/14/23   SUBJECTIVE:   SUBJECTIVE STATEMENT: Just feels tight around the knee.  PERTINENT HISTORY: Borderline diabetes, hip arthritis, HTN PAIN:  Are you having pain? Yes: NPRS scale: 0/10  Pain location: N/A Pain description:  N/A Aggravating factors: bending knee, standing too long, turning over in bed, keeping legs straight Relieving factors: not moving  PRECAUTIONS: Knee  RED FLAGS: None   WEIGHT BEARING RESTRICTIONS: No  FALLS:  Has patient fallen in last 6 months? No  LIVING ENVIRONMENT: Lives with: lives alone Lives in: House/apartment Stairs: Yes: Internal: 1 steps; none Has following equipment at home: Single point cane, Walker - 2 wheeled, and elevated toilet seat  OCCUPATION: Retired  PLOF: Independent: [employer] 3x/wk, walk in neighborhood, [employer] trips, walk 2 blocks  PATIENT GOALS: Be able to walk, swim, go to the beach, walk in sand w/o any pain, get back to normal life   OBJECTIVE:  Note: Objective measures were completed at Evaluation unless otherwise noted.  DIAGNOSTIC FINDINGS:   PATIENT SURVEYS:  LEFS 17 / 80 = 21.3 %  COGNITION: Overall cognitive status: Within functional limits for tasks assessed     SENSATION: WFL  EDEMA:  Circumferential:   R = 41.0 cm at 6 cm above  patella, 38.5 cm at mid-patella, 34.0 cm at 6 cm below patella L = 46.5 cm at 6 cm above patella, 44.0 cm at mid-patella, 38.5 cm at 6 cm below patella  MUSCLE LENGTH: Hamstrings: mod tight R, mild tight  ITB: mild tight R Piriformis: NT Hip flexors: mild tight R  POSTURE: rounded shoulders, forward head, and flexed trunk   PALPATION:  TTP and pain in L lateral knee & posterior knee/popliteal region  LOWER EXTREMITY ROM:  Active ROM Right eval Left eval L 10/03/23 Supine  L 10/06/23 Supine  L 10/13/23 L 10/20/23   Knee flexion 110 81 85 102 103 seated / 106 supine 110   Knee extension -9 in LAQ,      0 supported -22 in LAQ,   -21 supported 13 9 9 6  supine, 11 LAQ   (Blank rows = not tested)  LOWER EXTREMITY MMT:  MMT Right eval Left eval R 10/23/23 L 10/23/23  Hip flexion      Hip extension 4 4- (tested in S/L) 4 4  Hip abduction 4 3+ 4 4-  Hip adduction 4 4- Sitting 4+ Sitting 4+  Hip internal rotation      Hip external rotation      Knee flexion 4 3+ ^ dull 4+ 4  Knee extension 4 3+^ dull 4+ 4  Ankle dorsiflexion 4 4- 4+ 4+   (Blank rows = not tested) ^: pain  FUNCTIONAL TESTS:  Timed up and go (TUG): 18.5 sec with RW (10/01/23)                                                                                                                           TREATMENT DATE:   10/23/23 THERAPEUTIC EXERCISE: To improve strength, endurance, ROM, and flexibility.  Demonstration, verbal and tactile cues throughout for technique.  Rec Bike - L3 x 8 min LE MMT Gait 600' supervision no AD mild antalgic gait otherwise WFL  NEUROMUSCULAR RE-EDUCATION: To improve coordination, kinesthesia, and proprioception. Standing hip abduction x 10 RTB at thighs B Standing hip extension x 10 RTB at thighs B Squat RTB at thighs 10x3"  MANUAL THERAPY: To promote improved joint mobility and increased ROM Traditional PA mobs for knee extension grade III-IV Knee extension stretching with overpressure    10/20/23 THERAPEUTIC EXERCISE: To improve strength, endurance, ROM, and flexibility.  Demonstration, verbal and tactile cues throughout for technique.  Rec Bike - L3 x 8 min  Prone hang x 4 min Prone L TKE 10 x 3-5" Supine leg lengthener + quad set with heel on 6" FR 12 x 3", 2 sets Gravity assisted L knee flexion with L LE supported over PT arms, progressing to contract/relax into L knee flexion PROM Seated LAQ x 10 BATCA leg press 20# 2 x 10 BATCA knee flexion 15# B LE con/ecc x 10, 10# B con/L ecc x 10 BATCA knee extension 5# B LE con/ecc x 10  THERAPEUTIC ACTIVITIES: To improve functional performance.  Demonstration, verbal and tactile cues throughout for technique. TUG: 9.94 sec w/o AD  MANUAL THERAPY: To promote normalized muscle tension, improved flexibility, improved joint mobility, increased ROM, and reduced pain utilizing joint mobilization, connective tissue massage, and therapeutic massage.  STM/DTM and IASTM with roller stick and edge tool to HS and ITB in prone hang position L LE longitudinal distraction in prone hang position L knee tibiofemoral P/A mobs in prone hang position to promote L knee extension L knee femorotibial A/P mobs in supine with FR behind calf to promote L knee extension  NEUROMUSCULAR RE-EDUCATION: To improve coordination, kinesthesia, and proprioception. Standing blue TB TKE 10 x 5", 2 sets  MODALITIES: Moist heat to posterior knee with heel propped on FR for gentle extension stretch x 6 min   10/16/23 THERAPEUTIC EXERCISE: To improve strength, endurance, ROM, and flexibility.  Demonstration, verbal and tactile cues throughout for technique. Bike L2x108min Prone L TKE 10x5" NMR: Standing toe raise back to counter 2x10 Retro gait 3x10 ff down and back counter Standing TKE with GTB 10x5" Lateral step ups LLE 4' x 10 Fwd step ups LLE 4' x 10 MANUAL THERAPY: To promote normalized muscle tension, improved flexibility, and reduced pain utilizing  connective tissue massage. PA mobs for knee extension supine grade III-IV Knee extension with overpressure STM to L medial hamstring tendon, adductors  with prolonged knee ext stretch 5 min post session moist heat to posterior knee   10/13/23 THERAPEUTIC EXERCISE: To improve strength, endurance, ROM, and flexibility.  Demonstration, verbal and tactile cues throughout for technique. Recumbent Bike x 2 min partial rev; x 5 min full rev HS stretch w/ strap x 20"  Seated L knee extension stretch w/ chair - 30" Supine L ITB stretch w/ strap - felt slight pain & more at the hip than at the knee Standing L ITB stretch x 30" Prone L quad stretch 2 x 30" Prone hang - x 1 min Roller massage L ITB Release  Seated L LAQ w/ 1lb - 2 x 10 B Fitter's Press L - 2 x 10 - 1 black, 1 blue Seated L leg press w/ RTB x 5; discontinued due to pull in quad then stretched & completed x 6 more  MANUAL THERAPY: To promote normalized muscle tension, improved flexibility, and reduced pain utilizing connective tissue massage. Side-lying L ITB foam roll - SPT rolling foam roller over ITB at knee    PATIENT EDUCATION:  Education details: HEP progression - standing hip abduction and extension  Person educated: Patient Education method: Explanation, Demonstration, Tactile cues, Verbal cues, and Handouts Education comprehension: verbalized understanding, returned demonstration, verbal cues required, tactile cues required, and needs further education  HOME EXERCISE PROGRAM: Access Code: [email or web address] URL: [email or web address] Date: 10/23/2023 Prepared by: Resha Filippone  Exercises - Supine Hamstring Stretch with Strap (Mirrored)  - 2-3 x daily - 7 x weekly - 3 reps - 30 sec hold - Quad Setting and Stretching  - 2-3 x daily -  7 x weekly - 2 sets - 10 reps - 3-5 sec hold - Seated Passive Knee Extension  - 2-3 x daily - 7 x weekly - 3 reps - 30-60 sec hold - [place]  - 1 x daily - 5-7 x weekly - 2 sets -  10 reps - Modified Thomas Stretch  - 1 x daily - 5-7 x weekly - 2 sets - 30 hold - Supine Heel Slide with Strap  - 1 x daily - 7 x weekly - 2 sets - 10 reps - Seated Long Arc Quad  - 1 x daily - 7 x weekly - 2 sets - 10 reps - Gastroc Stretch on Wall  - 1 x daily - 7 x weekly - 2 sets - 2 reps - 30 seconds hold - Terminal Knee Extension with Ball and Counter  - 1 x daily - 7 x weekly - 2-3 sets - 10 reps - 5 seconds hold - Prone Knee Extension Hang  - 1 x daily - 7 x weekly - 2 sets - 2 reps - 1 min hold - Prone Quadriceps Stretch with Strap  - 1 x daily - 7 x weekly - 2 sets - 30 hold - Roller Massage Elongated IT Band Release  - 1 x daily - 7 x weekly - 3 sets - Standing Terminal Knee Extension with Resistance  - 1 x daily - 3-4 x weekly - 2 sets - 10 reps - 5 sec hold - Standing Hip Abduction with Resistance at Ankles and Counter Support  - 1 x daily - 3-4 x weekly - 2 sets - 10 reps - Standing Hip Extension with Resistance at Ankles and Counter Support  - 1 x daily - 3-4 x weekly - 2 sets - 10 reps  ASSESSMENT:  CLINICAL IMPRESSION: Pt has shown progress with PT. She shows a mild antalgic gait that needs cues for correction. LE strength has improved but more weakness seen with hip abduction and extension. Knee extension went down to 5 degrees today. Standing tolerance is limited to 20 min as of now. We did add gluteal strengthening with RTB to HEP to address weakness seen with MMT. Pt continues to benefit from skilled therapy to address remaining deficits.   OBJECTIVE IMPAIRMENTS: Abnormal gait, decreased activity tolerance, decreased balance, decreased coordination, decreased endurance, decreased knowledge of condition, decreased knowledge of use of DME, decreased mobility, difficulty walking, decreased ROM, decreased strength, increased edema, increased fascial restrictions, impaired perceived functional ability, increased muscle spasms, impaired flexibility, impaired sensation, impaired  tone, improper body mechanics, and pain.   ACTIVITY LIMITATIONS: lifting, bending, sitting, standing, squatting, sleeping, stairs, transfers, bed mobility, bathing, dressing, hygiene/grooming, and locomotion level  PARTICIPATION LIMITATIONS: meal prep, cleaning, laundry, driving, and community activity  PERSONAL FACTORS: Past/current experiences, Time since onset of injury/illness/exacerbation, and 3+ comorbidities: Borderline diabetes, hip arthritis, HTN are also affecting patient's functional outcome.   REHAB POTENTIAL: Good  CLINICAL DECISION MAKING: Evolving/moderate complexity  EVALUATION COMPLEXITY: Moderate   GOALS: Goals reviewed with patient? Yes  SHORT TERM GOALS: Target date: 10/27/2023  1.  Pt will be independent with initial HEP Baseline:  Goal status: MET - 10/20/23  2.  Patient will report at least 25% improvement in L knee pain to improve QOL. Baseline: 5/10 currently, 9.5/10 worst Goal status: MET - 10/16/23 - no pain the past few sessions  3.  Pt will report >/= 26/80 on LEFS (MCID = 9 pts) to demonstrate improved functional ability Baseline: 17 /  80 = 21.3 % Goal status: IN PROGRESS  4.  Pt will have improve L knee ROM to 10 to 90 to normalize gait pattern  Baseline: Refer to ROM table Goal status: MET - 10/06/23  5.  Pt will be able to perform TUG <20 sec w/ AD  Baseline: 18.5 sec w/ RW Goal status: MET - 10/20/23 - 9.94 sec w/o AD   LONG TERM GOALS: Target date: 11/24/2023   1.  Pt will be able to perform TUG <=13.5 sec w/o AD Baseline: 18.5 sec w/ RW Goal status: MET - 10/20/23 - 9.94 sec w/o AD  2.  Pt will demonstrate improved L LE strength to >/= 4+/5 for improved stability and ease of mobility. Baseline: Refer to MMT table Goal status: IN PROGRESS - 10/23/23 see MMT chart  3.  Pt will report >/= 35/80 on LEFS (MCID = 9 pts) to demonstrate improved functional ability Baseline: 17 / 80 = 21.3 % Goal status: IN PROGRESS  4.  Pt will have improve  L knee ROM to 0 to 110 to normalize gait pattern  Baseline:  Goal status: IN PROGRESS - 10/20/23 - L knee AROM 6-110 in supine, 11 L knee extension still lacking in seated LAQ  5.  Pt will be able to ambulate 600" w/o AD with normal gait pattern to return to community walking Baseline:  Goal status: IN PROGRESS - 10/23/23 see under treatment  6.  Pt will be able to sit or stand for 1hr w/o increased pain in L knee Baseline: Quite difficult to do  Goal status: PARTIALLY MET - 10/23/23 - able to stand ~20 min until pain; can sit for an hour   PLAN:  PT FREQUENCY: 2-3x/week - 3x/wk x 2 weeks, tapering to 2x/week for duration of POC   PT DURATION: 8 weeks  PLANNED INTERVENTIONS: 97164- PT Re-evaluation, 97750- Physical Performance Testing, 97110-Therapeutic exercises, 97530- Therapeutic activity, [id number]- Neuromuscular re-education, 97535- Self Care, [id number]- Manual therapy, [id number]- Gait training, [phone]- Aquatic Therapy, [phone]- Electrical stimulation (manual), 97016- Vasopneumatic device, 97035- Ultrasound, 21308- Ionotophoresis 4mg /ml Dexamethasone , Patient/Family education, Balance training, Stair training, Taping, Dry Needling, Joint mobilization, Scar mobilization, Compression bandaging, DME instructions, Cryotherapy, and Moist heat  PLAN FOR NEXT SESSION: fill out LEFS; progress flexibility & ROM focus more on extension/quad activation, introduce joint mobs, progress LE/core strengthening exercises    Kiannah Grunow Petra Brandy, PTA 10/23/2023, 12:02 PM  Annagrace is demonstrating good progress with PT s/p L TKA.  Recent L knee AROM 5-110 with quad weakness contributing to 11 extension lacking in LAQ.  Overall LE strength improving with L LE milder weaker than R.  She is working on weaning from the AD with gait but still demonstrates a slight antalgic gait pattern.  Carey has met the majority of her STGs as well as LTG #1 and is demonstrating good progress toward her remaining goals.  Margaretha will benefit  from continued skilled PT to address remaining deficits to improve mobility and activity tolerance with decreased pain interference.   Francisco Irving, PT 10/23/2023, 1:44 PM  [facility] [address]  [address] [place], [place], [address] Phone: [phone]   Fax:  [phone]     Date of referral: 08/29/23 Referring provider: Claiborne Crew, MD Referring diagnosis? M17.12 (ICD-10-CM) - Unilateral primary osteoarthritis, left knee  Treatment diagnosis? (if different than referring diagnosis)  Muscle weakness (generalized)  Acute pain of left knee  Stiffness of left knee, not elsewhere classified  Other abnormalities of gait and mobility  Localized edema  What was this (referring dx) caused by? Surgery (Type: L TKA)  Nature of Condition: Initial Onset (within last 3 months)   Laterality: Lt  Current Functional Measure Score: LEFS 17 / 80 = 21.3 %  Objective measurements identify impairments when they are compared to normal values, the uninvolved extremity, and prior level of function.  [x]  Yes  []  No  Objective assessment of functional ability: Moderate functional limitations   Briefly describe symptoms: L knee dull pain and swelling, limited and painful L knee flexion/extension ROM, B hip discomfort, B LE/core muscle weakness  How did symptoms start: After L TKA surgery on 09/23/23  Average pain intensity:  Last 24 hours: 6/10  Past week: 8/10  How often does the pt experience symptoms? Constantly  How much have the symptoms interfered with usual daily activities? Quite a bit  How has condition changed since care began at this facility? NA - initial visit  In general, how is the patients overall health? Very Good  Onset date: 09/23/23   BACK PAIN (STarT Back Screening Tool) - (When applicable):  Has your back pain spread down your leg(s) at sometime in the last 2 weeks? []  Yes   []  No Have you had pain  in the shoulder or neck at sometime in the past 2 weeks? []  Yes   []  No Have you only walked short distances because of your back pain? []  Yes   []  No In the past 2 weeks, have you dressed more slowly than usual because of your back pain? []  Yes   []  No Do you think it is not really safe for person with a condition like yours to be physically active? []  Yes   []  No Have worrying thoughts been going through your mind a lot of the time? []  Yes   []  No Do you feel that your back pain is terrible and it is never going to get any better? []  Yes   []  No In general, have you stopped enjoying all the things you usually enjoy? []  Yes   []  No Overall, how bothersome has your back pain been in the last 2 weeks? []  Not at all   []  Slightly     []  Moderate   []  Very much     []  Extremely

## 2023-10-27 ENCOUNTER — Other Ambulatory Visit (HOSPITAL_BASED_OUTPATIENT_CLINIC_OR_DEPARTMENT_OTHER): Payer: Self-pay

## 2023-10-27 ENCOUNTER — Ambulatory Visit: Admitting: Physical Therapy

## 2023-10-27 ENCOUNTER — Encounter: Payer: Self-pay | Admitting: Physical Therapy

## 2023-10-27 DIAGNOSIS — M6281 Muscle weakness (generalized): Secondary | ICD-10-CM

## 2023-10-27 DIAGNOSIS — R600 Localized edema: Secondary | ICD-10-CM | POA: Diagnosis not present

## 2023-10-27 DIAGNOSIS — M25562 Pain in left knee: Secondary | ICD-10-CM

## 2023-10-27 DIAGNOSIS — R2689 Other abnormalities of gait and mobility: Secondary | ICD-10-CM

## 2023-10-27 DIAGNOSIS — M25662 Stiffness of left knee, not elsewhere classified: Secondary | ICD-10-CM

## 2023-10-27 NOTE — Therapy (Signed)
 OUTPATIENT PHYSICAL THERAPY TREATMENT    Patient Name: Patricia Lee MRN: [id number] DOB:Jun 16, 1945, 78 y.o., female Today's Date: 10/27/2023  END OF SESSION:  PT End of Session - 10/27/23 1100     Visit Number 11    Date for PT Re-Evaluation 11/24/23    Authorization Type [employer]    Authorization Time Period 09/20/23-11/24/23    Authorization - Visit Number 11    Authorization - Number of Visits 14    PT Start Time 1100    PT Stop Time 1143    PT Time Calculation (min) 43 min    Activity Tolerance Patient tolerated treatment well    Behavior During Therapy [facility] for tasks assessed/performed                       Past Medical History:  Diagnosis Date   Allergy    spring seasonal   Arthritis    hip    Borderline diabetes 12/06/2016   Hyperlipidemia 10/21/2016   Hypertension    Pneumonia 2019   Preventative health care 02/19/2022   Past Surgical History:  Procedure Laterality Date   COLONOSCOPY     JOINT REPLACEMENT  Right hip   POLYPECTOMY     TOTAL HIP ARTHROPLASTY Right 04/09/2016   Procedure: RIGHT TOTAL HIP ARTHROPLASTY ANTERIOR APPROACH;  Surgeon: Claiborne Crew, MD;  Location: [facility];  Service: Orthopedics;  Laterality: Right;   TOTAL KNEE ARTHROPLASTY Left 09/23/2023   Procedure: ARTHROPLASTY, KNEE, TOTAL;  Surgeon: Claiborne Crew, MD;  Location: [facility];  Service: Orthopedics;  Laterality: Left;   Patient Active Problem List   Diagnosis Date Noted   S/P total knee arthroplasty, left 09/23/2023   Pre-op evaluation 08/15/2023   Back pain 06/24/2023   Tinnitus 02/25/2023   COVID-19 01/07/2023   Hyponatremia 01/07/2023   Preventative health care 02/19/2022   Hyperglycemia 02/13/2021   Chronic left shoulder pain 02/13/2021   Borderline diabetes 12/06/2016   Hyperlipidemia 10/21/2016   Overweight (BMI 25.0-29.9) 04/10/2016   S/P right THA, 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   History of skin cancer 07/28/2012   HERPES ZOSTER 11/27/2008   PREMATURE  VENTRICULAR CONTRACTIONS 11/17/2008   Essential hypertension 09/10/2007    PCP: Dorrene Gaucher, NP  REFERRING PROVIDER: Claiborne Crew, MD  REFERRING DIAG: [phone] (ICD-10-CM) - Unilateral primary osteoarthritis, left knee   THERAPY DIAG:  Muscle weakness (generalized)  Acute pain of left knee  Stiffness of left knee, not elsewhere classified  Other abnormalities of gait and mobility  Localized edema  RATIONALE FOR EVALUATION AND TREATMENT: Rehabilitation  ONSET DATE: 09/23/23 - L TKA  NEXT MD VISIT: 11/14/23   SUBJECTIVE:   SUBJECTIVE STATEMENT: Pt denies pain, just stiffness and fullness around the knee.  PERTINENT HISTORY: Borderline diabetes, hip arthritis, HTN PAIN:  Are you having pain? Yes: NPRS scale: 0/10  Pain location: N/A Pain description:  N/A Aggravating factors: bending knee, standing too long, turning over in bed, keeping legs straight Relieving factors: not moving  PRECAUTIONS: Knee  RED FLAGS: None   WEIGHT BEARING RESTRICTIONS: No  FALLS:  Has patient fallen in last 6 months? No  LIVING ENVIRONMENT: Lives with: lives alone Lives in: House/apartment Stairs: Yes: Internal: 1 steps; none Has following equipment at home: Single point cane, Walker - 2 wheeled, and elevated toilet seat  OCCUPATION: Retired  PLOF: Independent: [employer] 3x/wk, walk in neighborhood, [employer] trips, walk 2 blocks  PATIENT GOALS: Be able to walk, swim, go to the  beach, walk in sand w/o any pain, get back to normal life   OBJECTIVE:  Note: Objective measures were completed at Evaluation unless otherwise noted.  DIAGNOSTIC FINDINGS:   PATIENT SURVEYS:  LEFS 17 / 80 = 21.3 % 10/27/23: 58 / 80 = 72.5 %  COGNITION: Overall cognitive status: Within functional limits for tasks assessed     SENSATION: WFL  EDEMA:  Circumferential:   R = 41.0 cm at 6 cm above patella, 38.5 cm at mid-patella, 34.0 cm at 6 cm below patella L = 46.5 cm at 6 cm  above patella, 44.0 cm at mid-patella, 38.5 cm at 6 cm below patella  MUSCLE LENGTH: Hamstrings: mod tight R, mild tight  ITB: mild tight R Piriformis: NT Hip flexors: mild tight R  POSTURE: rounded shoulders, forward head, and flexed trunk   PALPATION:  TTP and pain in L lateral knee & posterior knee/popliteal region  LOWER EXTREMITY ROM:  Active ROM Right eval Left eval L 10/03/23 Supine  L 10/06/23 Supine  L 10/13/23 L 10/20/23   Knee flexion 110 81 85 102 103 seated / 106 supine 110   Knee extension -9 in LAQ,      0 supported -22 in LAQ,   -21 supported 13 9 9 6  supine, 11 LAQ   (Blank rows = not tested)  LOWER EXTREMITY MMT:  MMT Right eval Left eval R 10/23/23 L 10/23/23  Hip flexion      Hip extension 4 4- (tested in S/L) 4 4  Hip abduction 4 3+ 4 4-  Hip adduction 4 4- Sitting 4+ Sitting 4+  Hip internal rotation      Hip external rotation      Knee flexion 4 3+ ^ dull 4+ 4  Knee extension 4 3+ ^ dull 4+ 4  Ankle dorsiflexion 4 4- 4+ 4+   (Blank rows = not tested) ^: pain  FUNCTIONAL TESTS:  Timed up and go (TUG): 18.5 sec with RW (10/01/23)                                                                                                                           TREATMENT DATE:    10/27/23 THERAPEUTIC EXERCISE: To improve strength, endurance, ROM, and flexibility.  Demonstration, verbal and tactile cues throughout for technique.  Rec Bike - L3 x 7 min Standing L gastroc stretch in runner stretch position 3 x 30"  THERAPEUTIC ACTIVITIES: To improve functional performance.  Demonstration, verbal and tactile cues throughout for technique. LEFS: 58 / 80 = 72.5 % Stairs: Level of Assistance: Modified independence Stair Negotiation Technique: Alternating Pattern Forwards with Single Rail on Right Number of Stairs: 14  Height of Stairs: 7"  Comments: limited toe clearance on L on ascent, catching toes on edge of step; increased effort on L with stair ascent but only  mild eccentric weakness on descent  L LE only, then alt toe clears to 8" step x 20 each - working  on improving foot clearance L Forward 8" step-up x 15 - single UE support on counter  L Lateral 8" step-up x 10 - B UE support on counter  B Lateral 8" step-up x 10 - B UE support on counter  L Forward 6" eccentric step-down x 10 - single UE support on counter  L Lateral 6" eccentric step-down x 10 - B UE support on counter  MANUAL THERAPY: To promote improved flexibility, improved joint mobility, and increased ROM utilizing joint mobilization and scar mobilization.  L knee incisional scar massage along superior half of incision  L knee femorotibial PA mild with slight femoral IR in standing gastroc stretch position  SELF CARE:  Instruction provided for home performance of scar mobilization, cautioning pt to avoid areas where scabbing still present and avoid using lotion or oil until scar fully healed. Reviewed proper resting positions to promote knee extension at home, encouraging use of support ongoing with distal L LE including added pillow or towel roll even when sitting in recliner to encourage maximal knee extension.  Falls  NEUROMUSCULAR RE-EDUCATION: To improve balance, coordination, kinesthesia, posture, and proprioception. L Retro step 2 x 10 - 1 set each with contralateral and ipsilateral arm raise - better L knee extension note with ipsilateral arm raise   10/23/23 THERAPEUTIC EXERCISE: To improve strength, endurance, ROM, and flexibility.  Demonstration, verbal and tactile cues throughout for technique.  Rec Bike - L3 x 8 min LE MMT Gait 600' supervision no AD mild antalgic gait otherwise WFL  NEUROMUSCULAR RE-EDUCATION: To improve coordination, kinesthesia, and proprioception. Standing hip abduction x 10 RTB at thighs B Standing hip extension x 10 RTB at thighs B Squat RTB at thighs 10x3"  MANUAL THERAPY: To promote improved joint mobility and increased ROM Traditional PA  mobs for knee extension grade III-IV Knee extension stretching with overpressure    10/20/23 THERAPEUTIC EXERCISE: To improve strength, endurance, ROM, and flexibility.  Demonstration, verbal and tactile cues throughout for technique.  Rec Bike - L3 x 8 min Prone hang x 4 min Prone L TKE 10 x 3-5" Supine leg lengthener + quad set with heel on 6" FR 12 x 3", 2 sets Gravity assisted L knee flexion with L LE supported over PT arms, progressing to contract/relax into L knee flexion PROM Seated LAQ x 10 BATCA leg press 20# 2 x 10 BATCA knee flexion 15# B LE con/ecc x 10, 10# B con/L ecc x 10 BATCA knee extension 5# B LE con/ecc x 10  THERAPEUTIC ACTIVITIES: To improve functional performance.  Demonstration, verbal and tactile cues throughout for technique. TUG: 9.94 sec w/o AD  MANUAL THERAPY: To promote normalized muscle tension, improved flexibility, improved joint mobility, increased ROM, and reduced pain utilizing joint mobilization, connective tissue massage, and therapeutic massage.  STM/DTM and IASTM with roller stick and edge tool to HS and ITB in prone hang position L LE longitudinal distraction in prone hang position L knee tibiofemoral P/A mobs in prone hang position to promote L knee extension L knee femorotibial A/P mobs in supine with FR behind calf to promote L knee extension  NEUROMUSCULAR RE-EDUCATION: To improve coordination, kinesthesia, and proprioception. Standing blue TB TKE 10 x 5", 2 sets  MODALITIES: Moist heat to posterior knee with heel propped on FR for gentle extension stretch x 6 min    PATIENT EDUCATION:  Education details: HEP progression - retro step with ipsilateral lateral arm raise to promote knee extension and scar tissue mobilization  Person educated:  Patient Education method: Explanation, Demonstration, Tactile cues, Verbal cues, and Handouts Education comprehension: verbalized understanding, returned demonstration, verbal cues required, tactile  cues required, and needs further education  HOME EXERCISE PROGRAM: Access Code: [email or web address] URL: [email or web address] Date: 10/27/2023 Prepared by: Felecia Hopper  Exercises - Supine Hamstring Stretch with Strap (Mirrored)  - 2-3 x daily - 7 x weekly - 3 reps - 30 sec hold - Quad Setting and Stretching  - 2-3 x daily - 7 x weekly - 2 sets - 10 reps - 3-5 sec hold - Seated Passive Knee Extension  - 2-3 x daily - 7 x weekly - 3 reps - 30-60 sec hold - [place]  - 1 x daily - 5-7 x weekly - 2 sets - 10 reps - Modified Thomas Stretch  - 1 x daily - 5-7 x weekly - 2 sets - 30 hold - Supine Heel Slide with Strap  - 1 x daily - 7 x weekly - 2 sets - 10 reps - Seated Long Arc Quad  - 1 x daily - 7 x weekly - 2 sets - 10 reps - Gastroc Stretch on Wall  - 1 x daily - 7 x weekly - 2 sets - 2 reps - 30 seconds hold - Terminal Knee Extension with Ball and Counter  - 1 x daily - 7 x weekly - 2-3 sets - 10 reps - 5 seconds hold - Prone Knee Extension Hang  - 1 x daily - 7 x weekly - 2 sets - 2 reps - 1 min hold - Prone Quadriceps Stretch with Strap  - 1 x daily - 7 x weekly - 2 sets - 30 hold - Roller Massage Elongated IT Band Release  - 1 x daily - 7 x weekly - 3 sets - Standing Terminal Knee Extension with Resistance  - 1 x daily - 3-4 x weekly - 2 sets - 10 reps - 5 sec hold - Standing Hip Abduction with Resistance at Ankles and Counter Support  - 1 x daily - 3-4 x weekly - 2 sets - 10 reps - Standing Hip Extension with Resistance at Ankles and Counter Support  - 1 x daily - 3-4 x weekly - 2 sets - 10 reps - Retro Step  - 1 x daily - 7 x weekly - 2 sets - 10 reps - 3 sec hold - Anterior Knee Scar Massage  - 2 x daily - 7 x weekly - ~2 min hold  ASSESSMENT:  CLINICAL IMPRESSION: Benigna inquired about when she can start putting lotion on her knee.  Inspection of incisional scar reveals continued mild scabbing at distal incision, therefore instructed her to wait until all scabbing gone and  incision appears fully intact before starting using lotion but did provide instruction in incisional scar massage for intact areas of incision.  LEFS reveals significant improvement with current score of 58/80 indicating only mild functional limitation - STG #3 & LTG #3 met.  Based on LEFS assessment, patient noting moderate difficulty with stair negotiation which she attributes to limited opportunity to practice on stairs as she does not have stairs at home.  With assessment of stair negotiation she demonstrated limited toe clearance on left, catching her toes on edge of step on occasion, therefore initiated therapeutic activities working on hip and knee flexion for improved toe clearance and then progressing to strengthening for increased ease of lift and eccentric lowering with stair negotiation.  She continues to lack full knee extension therefore worked on  mobilization and stretches/exercises to promote increased L knee extension with HEP updated accordingly.  Doninique will benefit from continued skilled PT to address ongoing ROM, strength and balance deficits to improve mobility and activity tolerance with decreased pain interference.   OBJECTIVE IMPAIRMENTS: Abnormal gait, decreased activity tolerance, decreased balance, decreased coordination, decreased endurance, decreased knowledge of condition, decreased knowledge of use of DME, decreased mobility, difficulty walking, decreased ROM, decreased strength, increased edema, increased fascial restrictions, impaired perceived functional ability, increased muscle spasms, impaired flexibility, impaired sensation, impaired tone, improper body mechanics, and pain.   ACTIVITY LIMITATIONS: lifting, bending, sitting, standing, squatting, sleeping, stairs, transfers, bed mobility, bathing, dressing, hygiene/grooming, and locomotion level  PARTICIPATION LIMITATIONS: meal prep, cleaning, laundry, driving, and community activity  PERSONAL FACTORS: Past/current  experiences, Time since onset of injury/illness/exacerbation, and 3+ comorbidities: Borderline diabetes, hip arthritis, HTN are also affecting patient's functional outcome.   REHAB POTENTIAL: Good  CLINICAL DECISION MAKING: Evolving/moderate complexity  EVALUATION COMPLEXITY: Moderate   GOALS: Goals reviewed with patient? Yes  SHORT TERM GOALS: Target date: 10/27/2023  1.  Pt will be independent with initial HEP Baseline:  Goal status: MET - 10/20/23  2.  Patient will report at least 25% improvement in L knee pain to improve QOL. Baseline: 5/10 currently, 9.5/10 worst Goal status: MET - 10/16/23 - no pain the past few sessions  3.  Pt will report >/= 26/80 on LEFS (MCID = 9 pts) to demonstrate improved functional ability Baseline: 17 / 80 = 21.3 % Goal status: MET - 10/27/23 - 58 / 80 = 72.5 %  4.  Pt will have improve L knee ROM to 10 to 90 to normalize gait pattern  Baseline: Refer to ROM table Goal status: MET - 10/06/23  5.  Pt will be able to perform TUG <20 sec w/ AD  Baseline: 18.5 sec w/ RW Goal status: MET - 10/20/23 - 9.94 sec w/o AD   LONG TERM GOALS: Target date: 11/24/2023   1.  Pt will be able to perform TUG <=13.5 sec w/o AD Baseline: 18.5 sec w/ RW Goal status: MET - 10/20/23 - 9.94 sec w/o AD  2.  Pt will demonstrate improved L LE strength to >/= 4+/5 for improved stability and ease of mobility. Baseline: Refer to MMT table Goal status: IN PROGRESS - 10/23/23 see MMT chart  3.  Pt will report >/= 35/80 on LEFS (MCID = 9 pts) to demonstrate improved functional ability Baseline: 17 / 80 = 21.3 % Goal status: MET - 10/27/23 - 58 / 80 = 72.5 %  4.  Pt will have improve L knee ROM to 0 to 110 to normalize gait pattern  Baseline:  Goal status: IN PROGRESS - 10/20/23 - L knee AROM 6-110 in supine, 11 L knee extension still lacking in seated LAQ  5.  Pt will be able to ambulate 600" w/o AD with normal gait pattern to return to community walking Baseline:   Goal status: IN PROGRESS - 10/23/23 see under treatment  6.  Pt will be able to sit or stand for 1hr w/o increased pain in L knee Baseline: Quite difficult to do  Goal status: PARTIALLY MET - 10/23/23 - able to stand ~20 min until pain; can sit for an hour   PLAN:  PT FREQUENCY: 2-3x/week - 3x/wk x 2 weeks, tapering to 2x/week for duration of POC   PT DURATION: 8 weeks  PLANNED INTERVENTIONS: 97164- PT Re-evaluation, 97750- Physical Performance Testing, 97110-Therapeutic exercises, 97530- Therapeutic activity, 97112-  Neuromuscular re-education, [phone]- Self Care, 82956- Manual therapy, [phone]- Gait training, [phone]- Aquatic Therapy, [phone]- Electrical stimulation (manual), [id number]- Vasopneumatic device, [id number]- Ultrasound, [id number]- Ionotophoresis 4mg /ml Dexamethasone , Patient/Family education, Balance training, Stair training, Taping, Dry Needling, Joint mobilization, Scar mobilization, Compression bandaging, DME instructions, Cryotherapy, and Moist heat  PLAN FOR NEXT SESSION: progress flexibility & ROM focus more on extension/quad activation, continue joint mobs, progress LE/core strengthening exercises    Francisco Irving, PT 10/27/2023, 12:05 PM     Date of referral: 08/29/23 Referring provider: Claiborne Crew, MD Referring diagnosis? M17.12 (ICD-10-CM) - Unilateral primary osteoarthritis, left knee  Treatment diagnosis? (if different than referring diagnosis)  Muscle weakness (generalized)  Acute pain of left knee  Stiffness of left knee, not elsewhere classified  Other abnormalities of gait and mobility  Localized edema  What was this (referring dx) caused by? Surgery (Type: L TKA)  Nature of Condition: Initial Onset (within last 3 months)   Laterality: Lt  Current Functional Measure Score: LEFS 17 / 80 = 21.3 %  Objective measurements identify impairments when they are compared to normal values, the uninvolved extremity, and prior level of function.  [x]  Yes  []  No  Objective  assessment of functional ability: Moderate functional limitations   Briefly describe symptoms: L knee dull pain and swelling, limited and painful L knee flexion/extension ROM, B hip discomfort, B LE/core muscle weakness  How did symptoms start: After L TKA surgery on 09/23/23  Average pain intensity:  Last 24 hours: 6/10  Past week: 8/10  How often does the pt experience symptoms? Constantly  How much have the symptoms interfered with usual daily activities? Quite a bit  How has condition changed since care began at this facility? NA - initial visit  In general, how is the patients overall health? Very Good  Onset date: 09/23/23   BACK PAIN (STarT Back Screening Tool) - (When applicable):  Has your back pain spread down your leg(s) at sometime in the last 2 weeks? []  Yes   []  No Have you had pain in the shoulder or neck at sometime in the past 2 weeks? []  Yes   []  No Have you only walked short distances because of your back pain? []  Yes   []  No In the past 2 weeks, have you dressed more slowly than usual because of your back pain? []  Yes   []  No Do you think it is not really safe for person with a condition like yours to be physically active? []  Yes   []  No Have worrying thoughts been going through your mind a lot of the time? []  Yes   []  No Do you feel that your back pain is terrible and it is never going to get any better? []  Yes   []  No In general, have you stopped enjoying all the things you usually enjoy? []  Yes   []  No Overall, how bothersome has your back pain been in the last 2 weeks? []  Not at all   []  Slightly     []  Moderate   []  Very much     []  Extremely

## 2023-10-29 ENCOUNTER — Ambulatory Visit

## 2023-10-29 DIAGNOSIS — M25662 Stiffness of left knee, not elsewhere classified: Secondary | ICD-10-CM

## 2023-10-29 DIAGNOSIS — M25562 Pain in left knee: Secondary | ICD-10-CM | POA: Diagnosis not present

## 2023-10-29 DIAGNOSIS — R600 Localized edema: Secondary | ICD-10-CM

## 2023-10-29 DIAGNOSIS — M6281 Muscle weakness (generalized): Secondary | ICD-10-CM

## 2023-10-29 DIAGNOSIS — R2689 Other abnormalities of gait and mobility: Secondary | ICD-10-CM | POA: Diagnosis not present

## 2023-10-29 NOTE — Therapy (Signed)
 OUTPATIENT PHYSICAL THERAPY TREATMENT    Patient Name: Patricia Lee MRN: [id number] DOB:Sep 29, 1945, 78 y.o., female Today's Date: 10/29/2023  END OF SESSION:  PT End of Session - 10/29/23 0938     Visit Number 12    Date for PT Re-Evaluation 11/24/23    Authorization Type [employer]    Authorization Time Period 09/20/23-11/24/23    Authorization - Number of Visits 14    PT Start Time 0931    PT Stop Time 1014    PT Time Calculation (min) 43 min    Activity Tolerance Patient tolerated treatment well    Behavior During Therapy [facility] for tasks assessed/performed                        Past Medical History:  Diagnosis Date   Allergy    spring seasonal   Arthritis    hip    Borderline diabetes 12/06/2016   Hyperlipidemia 10/21/2016   Hypertension    Pneumonia 2019   Preventative health care 02/19/2022   Past Surgical History:  Procedure Laterality Date   COLONOSCOPY     JOINT REPLACEMENT  Right hip   POLYPECTOMY     TOTAL HIP ARTHROPLASTY Right 04/09/2016   Procedure: RIGHT TOTAL HIP ARTHROPLASTY ANTERIOR APPROACH;  Surgeon: Claiborne Crew, MD;  Location: [facility];  Service: Orthopedics;  Laterality: Right;   TOTAL KNEE ARTHROPLASTY Left 09/23/2023   Procedure: ARTHROPLASTY, KNEE, TOTAL;  Surgeon: Claiborne Crew, MD;  Location: [facility];  Service: Orthopedics;  Laterality: Left;   Patient Active Problem List   Diagnosis Date Noted   S/P total knee arthroplasty, left 09/23/2023   Pre-op evaluation 08/15/2023   Back pain 06/24/2023   Tinnitus 02/25/2023   COVID-19 01/07/2023   Hyponatremia 01/07/2023   Preventative health care 02/19/2022   Hyperglycemia 02/13/2021   Chronic left shoulder pain 02/13/2021   Borderline diabetes 12/06/2016   Hyperlipidemia 10/21/2016   Overweight (BMI 25.0-29.9) 04/10/2016   S/P right THA, 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   History of skin cancer 07/28/2012   HERPES ZOSTER 11/27/2008   PREMATURE VENTRICULAR CONTRACTIONS 11/17/2008    Essential hypertension 09/10/2007    PCP: Dorrene Gaucher, NP  REFERRING PROVIDER: Claiborne Crew, MD  REFERRING DIAG: [phone] (ICD-10-CM) - Unilateral primary osteoarthritis, left knee   THERAPY DIAG:  Muscle weakness (generalized)  Acute pain of left knee  Stiffness of left knee, not elsewhere classified  Other abnormalities of gait and mobility  Localized edema  RATIONALE FOR EVALUATION AND TREATMENT: Rehabilitation  ONSET DATE: 09/23/23 - L TKA  NEXT MD VISIT: 11/14/23   SUBJECTIVE:   SUBJECTIVE STATEMENT: Pt denies pain, just stiffness and fullness around the knee.  PERTINENT HISTORY: Borderline diabetes, hip arthritis, HTN PAIN:  Are you having pain? Yes: NPRS scale: 0/10  Pain location: N/A Pain description:  N/A Aggravating factors: bending knee, standing too long, turning over in bed, keeping legs straight Relieving factors: not moving  PRECAUTIONS: Knee  RED FLAGS: None   WEIGHT BEARING RESTRICTIONS: No  FALLS:  Has patient fallen in last 6 months? No  LIVING ENVIRONMENT: Lives with: lives alone Lives in: House/apartment Stairs: Yes: Internal: 1 steps; none Has following equipment at home: Single point cane, Walker - 2 wheeled, and elevated toilet seat  OCCUPATION: Retired  PLOF: Independent: [employer] 3x/wk, walk in neighborhood, [employer] trips, walk 2 blocks  PATIENT GOALS: Be able to walk, swim, go to the beach, walk in sand w/o any pain,  get back to normal life   OBJECTIVE:  Note: Objective measures were completed at Evaluation unless otherwise noted.  DIAGNOSTIC FINDINGS:   PATIENT SURVEYS:  LEFS 17 / 80 = 21.3 % 10/27/23: 58 / 80 = 72.5 %  COGNITION: Overall cognitive status: Within functional limits for tasks assessed     SENSATION: WFL  EDEMA:  Circumferential:   R = 41.0 cm at 6 cm above patella, 38.5 cm at mid-patella, 34.0 cm at 6 cm below patella L = 46.5 cm at 6 cm above patella, 44.0 cm at  mid-patella, 38.5 cm at 6 cm below patella  MUSCLE LENGTH: Hamstrings: mod tight R, mild tight  ITB: mild tight R Piriformis: NT Hip flexors: mild tight R  POSTURE: rounded shoulders, forward head, and flexed trunk   PALPATION:  TTP and pain in L lateral knee & posterior knee/popliteal region  LOWER EXTREMITY ROM:  Active ROM Right eval Left eval L 10/03/23 Supine  L 10/06/23 Supine  L 10/13/23 L 10/20/23   Knee flexion 110 81 85 102 103 seated / 106 supine 110   Knee extension -9 in LAQ,      0 supported -22 in LAQ,   -21 supported 13 9 9 6  supine, 11 LAQ   (Blank rows = not tested)  LOWER EXTREMITY MMT:  MMT Right eval Left eval R 10/23/23 L 10/23/23  Hip flexion      Hip extension 4 4- (tested in S/L) 4 4  Hip abduction 4 3+ 4 4-  Hip adduction 4 4- Sitting 4+ Sitting 4+  Hip internal rotation      Hip external rotation      Knee flexion 4 3+ ^ dull 4+ 4  Knee extension 4 3+ ^ dull 4+ 4  Ankle dorsiflexion 4 4- 4+ 4+   (Blank rows = not tested) ^: pain  FUNCTIONAL TESTS:  Timed up and go (TUG): 18.5 sec with RW (10/01/23)                                                                                                                           TREATMENT DATE:   10/29/23 THERAPEUTIC EXERCISE: To improve strength, endurance, ROM, and flexibility.  Demonstration, verbal and tactile cues throughout for technique.  Rec Bike - L3 x 6 min With 6 inch step:  Standing L heel drop gatsroc stretch 2x30"  Heel drop heel raise 2x10 B Standing runner stretch with femur IR and extension mobilization 3x15" BATCA leg press 20# 2 x 10; 5lb LLE x 10 BATCA knee extension 15# B LE; x 8 RLE 5lb MANUAL THERAPY: To promote improved flexibility, improved joint mobility, and increased ROM utilizing joint mobilization and scar mobilization. STM to hamstring tendons with knee extended Knee extension mobilizations with heel supported by towel 10/27/23 THERAPEUTIC EXERCISE: To improve strength,  endurance, ROM, and flexibility.  Demonstration, verbal and tactile cues throughout for technique.  Rec Bike - L3 x 7 min Standing L gastroc  stretch in runner stretch position 3 x 30"  THERAPEUTIC ACTIVITIES: To improve functional performance.  Demonstration, verbal and tactile cues throughout for technique. LEFS: 58 / 80 = 72.5 % Stairs: Level of Assistance: Modified independence Stair Negotiation Technique: Alternating Pattern Forwards with Single Rail on Right Number of Stairs: 14  Height of Stairs: 7"  Comments: limited toe clearance on L on ascent, catching toes on edge of step; increased effort on L with stair ascent but only mild eccentric weakness on descent  L LE only, then alt toe clears to 8" step x 20 each - working on improving foot clearance L Forward 8" step-up x 15 - single UE support on counter  L Lateral 8" step-up x 10 - B UE support on counter  B Lateral 8" step-up x 10 - B UE support on counter  L Forward 6" eccentric step-down x 10 - single UE support on counter  L Lateral 6" eccentric step-down x 10 - B UE support on counter  MANUAL THERAPY: To promote improved flexibility, improved joint mobility, and increased ROM utilizing joint mobilization and scar mobilization.  L knee incisional scar massage along superior half of incision  L knee femorotibial PA mild with slight femoral IR in standing gastroc stretch position  SELF CARE:  Instruction provided for home performance of scar mobilization, cautioning pt to avoid areas where scabbing still present and avoid using lotion or oil until scar fully healed. Reviewed proper resting positions to promote knee extension at home, encouraging use of support ongoing with distal L LE including added pillow or towel roll even when sitting in recliner to encourage maximal knee extension.  Falls  NEUROMUSCULAR RE-EDUCATION: To improve balance, coordination, kinesthesia, posture, and proprioception. L Retro step 2 x 10 - 1 set each  with contralateral and ipsilateral arm raise - better L knee extension note with ipsilateral arm raise   10/23/23 THERAPEUTIC EXERCISE: To improve strength, endurance, ROM, and flexibility.  Demonstration, verbal and tactile cues throughout for technique.  Rec Bike - L3 x 8 min LE MMT Gait 600' supervision no AD mild antalgic gait otherwise WFL  NEUROMUSCULAR RE-EDUCATION: To improve coordination, kinesthesia, and proprioception. Standing hip abduction x 10 RTB at thighs B Standing hip extension x 10 RTB at thighs B Squat RTB at thighs 10x3"  MANUAL THERAPY: To promote improved joint mobility and increased ROM Traditional PA mobs for knee extension grade III-IV Knee extension stretching with overpressure    10/20/23 THERAPEUTIC EXERCISE: To improve strength, endurance, ROM, and flexibility.  Demonstration, verbal and tactile cues throughout for technique.  Rec Bike - L3 x 8 min Prone hang x 4 min Prone L TKE 10 x 3-5" Supine leg lengthener + quad set with heel on 6" FR 12 x 3", 2 sets Gravity assisted L knee flexion with L LE supported over PT arms, progressing to contract/relax into L knee flexion PROM Seated LAQ x 10 BATCA leg press 20# 2 x 10 BATCA knee flexion 15# B LE con/ecc x 10, 10# B con/L ecc x 10 BATCA knee extension 5# B LE con/ecc x 10  THERAPEUTIC ACTIVITIES: To improve functional performance.  Demonstration, verbal and tactile cues throughout for technique. TUG: 9.94 sec w/o AD  MANUAL THERAPY: To promote normalized muscle tension, improved flexibility, improved joint mobility, increased ROM, and reduced pain utilizing joint mobilization, connective tissue massage, and therapeutic massage.  STM/DTM and IASTM with roller stick and edge tool to HS and ITB in prone hang position L LE  longitudinal distraction in prone hang position L knee tibiofemoral P/A mobs in prone hang position to promote L knee extension L knee femorotibial A/P mobs in supine with FR behind calf  to promote L knee extension  NEUROMUSCULAR RE-EDUCATION: To improve coordination, kinesthesia, and proprioception. Standing blue TB TKE 10 x 5", 2 sets  MODALITIES: Moist heat to posterior knee with heel propped on FR for gentle extension stretch x 6 min    PATIENT EDUCATION:  Education details: HEP progression - retro step with ipsilateral lateral arm raise to promote knee extension and scar tissue mobilization  Person educated: Patient Education method: Explanation, Demonstration, Tactile cues, Verbal cues, and Handouts Education comprehension: verbalized understanding, returned demonstration, verbal cues required, tactile cues required, and needs further education  HOME EXERCISE PROGRAM: Access Code: [email or web address] URL: [email or web address] Date: 10/27/2023 Prepared by: Felecia Hopper  Exercises - Supine Hamstring Stretch with Strap (Mirrored)  - 2-3 x daily - 7 x weekly - 3 reps - 30 sec hold - Quad Setting and Stretching  - 2-3 x daily - 7 x weekly - 2 sets - 10 reps - 3-5 sec hold - Seated Passive Knee Extension  - 2-3 x daily - 7 x weekly - 3 reps - 30-60 sec hold - [place]  - 1 x daily - 5-7 x weekly - 2 sets - 10 reps - Modified Thomas Stretch  - 1 x daily - 5-7 x weekly - 2 sets - 30 hold - Supine Heel Slide with Strap  - 1 x daily - 7 x weekly - 2 sets - 10 reps - Seated Long Arc Quad  - 1 x daily - 7 x weekly - 2 sets - 10 reps - Gastroc Stretch on Wall  - 1 x daily - 7 x weekly - 2 sets - 2 reps - 30 seconds hold - Terminal Knee Extension with Ball and Counter  - 1 x daily - 7 x weekly - 2-3 sets - 10 reps - 5 seconds hold - Prone Knee Extension Hang  - 1 x daily - 7 x weekly - 2 sets - 2 reps - 1 min hold - Prone Quadriceps Stretch with Strap  - 1 x daily - 7 x weekly - 2 sets - 30 hold - Roller Massage Elongated IT Band Release  - 1 x daily - 7 x weekly - 3 sets - Standing Terminal Knee Extension with Resistance  - 1 x daily - 3-4 x weekly - 2 sets - 10  reps - 5 sec hold - Standing Hip Abduction with Resistance at Ankles and Counter Support  - 1 x daily - 3-4 x weekly - 2 sets - 10 reps - Standing Hip Extension with Resistance at Ankles and Counter Support  - 1 x daily - 3-4 x weekly - 2 sets - 10 reps - Retro Step  - 1 x daily - 7 x weekly - 2 sets - 10 reps - 3 sec hold - Anterior Knee Scar Massage  - 2 x daily - 7 x weekly - ~2 min hold  ASSESSMENT:  CLINICAL IMPRESSION: Pt continues to be limited with full knee extension. Focused on strengthening with some posterior capsule stretching. After MT and exercise only able to achieve 8 deg of knee extension at most. We focused more on isolating LLE with weight machines but she fatigued quickly in L LE. Patricia Lee will benefit from continued skilled PT to address ongoing ROM, strength and balance deficits to improve mobility and activity  tolerance with decreased pain interference.   OBJECTIVE IMPAIRMENTS: Abnormal gait, decreased activity tolerance, decreased balance, decreased coordination, decreased endurance, decreased knowledge of condition, decreased knowledge of use of DME, decreased mobility, difficulty walking, decreased ROM, decreased strength, increased edema, increased fascial restrictions, impaired perceived functional ability, increased muscle spasms, impaired flexibility, impaired sensation, impaired tone, improper body mechanics, and pain.   ACTIVITY LIMITATIONS: lifting, bending, sitting, standing, squatting, sleeping, stairs, transfers, bed mobility, bathing, dressing, hygiene/grooming, and locomotion level  PARTICIPATION LIMITATIONS: meal prep, cleaning, laundry, driving, and community activity  PERSONAL FACTORS: Past/current experiences, Time since onset of injury/illness/exacerbation, and 3+ comorbidities: Borderline diabetes, hip arthritis, HTN are also affecting patient's functional outcome.   REHAB POTENTIAL: Good  CLINICAL DECISION MAKING: Evolving/moderate  complexity  EVALUATION COMPLEXITY: Moderate   GOALS: Goals reviewed with patient? Yes  SHORT TERM GOALS: Target date: 10/27/2023  1.  Pt will be independent with initial HEP Baseline:  Goal status: MET - 10/20/23  2.  Patient will report at least 25% improvement in L knee pain to improve QOL. Baseline: 5/10 currently, 9.5/10 worst Goal status: MET - 10/16/23 - no pain the past few sessions  3.  Pt will report >/= 26/80 on LEFS (MCID = 9 pts) to demonstrate improved functional ability Baseline: 17 / 80 = 21.3 % Goal status: MET - 10/27/23 - 58 / 80 = 72.5 %  4.  Pt will have improve L knee ROM to 10 to 90 to normalize gait pattern  Baseline: Refer to ROM table Goal status: MET - 10/06/23  5.  Pt will be able to perform TUG <20 sec w/ AD  Baseline: 18.5 sec w/ RW Goal status: MET - 10/20/23 - 9.94 sec w/o AD   LONG TERM GOALS: Target date: 11/24/2023   1.  Pt will be able to perform TUG <=13.5 sec w/o AD Baseline: 18.5 sec w/ RW Goal status: MET - 10/20/23 - 9.94 sec w/o AD  2.  Pt will demonstrate improved L LE strength to >/= 4+/5 for improved stability and ease of mobility. Baseline: Refer to MMT table Goal status: IN PROGRESS - 10/23/23 see MMT chart  3.  Pt will report >/= 35/80 on LEFS (MCID = 9 pts) to demonstrate improved functional ability Baseline: 17 / 80 = 21.3 % Goal status: MET - 10/27/23 - 58 / 80 = 72.5 %  4.  Pt will have improve L knee ROM to 0 to 110 to normalize gait pattern  Baseline:  Goal status: IN PROGRESS - 10/20/23 - L knee AROM 6-110 in supine, 11 L knee extension still lacking in seated LAQ  5.  Pt will be able to ambulate 600" w/o AD with normal gait pattern to return to community walking Baseline:  Goal status: IN PROGRESS - 10/23/23 see under treatment  6.  Pt will be able to sit or stand for 1hr w/o increased pain in L knee Baseline: Quite difficult to do  Goal status: PARTIALLY MET - 10/23/23 - able to stand ~20 min until pain; can sit  for an hour   PLAN:  PT FREQUENCY: 2-3x/week - 3x/wk x 2 weeks, tapering to 2x/week for duration of POC   PT DURATION: 8 weeks  PLANNED INTERVENTIONS: 97164- PT Re-evaluation, 97750- Physical Performance Testing, 97110-Therapeutic exercises, 97530- Therapeutic activity, [id number]- Neuromuscular re-education, 97535- Self Care, 16109- Manual therapy, [id number]- Gait training, [id number]- Aquatic Therapy, [id number]- Electrical stimulation (manual), [id number]- Vasopneumatic device, [id number]- Ultrasound, [id number]- Ionotophoresis 4mg /ml Dexamethasone , Patient/Family education, Balance training, Stair training, Taping,  Dry Needling, Joint mobilization, Scar mobilization, Compression bandaging, DME instructions, Cryotherapy, and Moist heat  PLAN FOR NEXT SESSION: progress flexibility & ROM focus more on extension/quad activation, continue joint mobs, progress LE/core strengthening exercises    Patricia Lee, PTA 10/29/2023, 10:15 AM     Date of referral: 08/29/23 Referring provider: Claiborne Crew, MD Referring diagnosis? M17.12 (ICD-10-CM) - Unilateral primary osteoarthritis, left knee  Treatment diagnosis? (if different than referring diagnosis)  Muscle weakness (generalized)  Acute pain of left knee  Stiffness of left knee, not elsewhere classified  Other abnormalities of gait and mobility  Localized edema  What was this (referring dx) caused by? Surgery (Type: L TKA)  Nature of Condition: Initial Onset (within last 3 months)   Laterality: Lt  Current Functional Measure Score: LEFS 17 / 80 = 21.3 %  Objective measurements identify impairments when they are compared to normal values, the uninvolved extremity, and prior level of function.  [x]  Yes  []  No  Objective assessment of functional ability: Moderate functional limitations   Briefly describe symptoms: L knee dull pain and swelling, limited and painful L knee flexion/extension ROM, B hip discomfort, B LE/core muscle weakness  How did symptoms  start: After L TKA surgery on 09/23/23  Average pain intensity:  Last 24 hours: 6/10  Past week: 8/10  How often does the pt experience symptoms? Constantly  How much have the symptoms interfered with usual daily activities? Quite a bit  How has condition changed since care began at this facility? NA - initial visit  In general, how is the patients overall health? Very Good  Onset date: 09/23/23   BACK PAIN (STarT Back Screening Tool) - (When applicable):  Has your back pain spread down your leg(s) at sometime in the last 2 weeks? []  Yes   []  No Have you had pain in the shoulder or neck at sometime in the past 2 weeks? []  Yes   []  No Have you only walked short distances because of your back pain? []  Yes   []  No In the past 2 weeks, have you dressed more slowly than usual because of your back pain? []  Yes   []  No Do you think it is not really safe for person with a condition like yours to be physically active? []  Yes   []  No Have worrying thoughts been going through your mind a lot of the time? []  Yes   []  No Do you feel that your back pain is terrible and it is never going to get any better? []  Yes   []  No In general, have you stopped enjoying all the things you usually enjoy? []  Yes   []  No Overall, how bothersome has your back pain been in the last 2 weeks? []  Not at all   []  Slightly     []  Moderate   []  Very much     []  Extremely

## 2023-11-04 ENCOUNTER — Other Ambulatory Visit (HOSPITAL_COMMUNITY): Payer: Self-pay

## 2023-11-05 ENCOUNTER — Other Ambulatory Visit: Payer: Self-pay | Admitting: Family

## 2023-11-11 ENCOUNTER — Ambulatory Visit: Attending: Orthopedic Surgery

## 2023-11-11 DIAGNOSIS — R2689 Other abnormalities of gait and mobility: Secondary | ICD-10-CM | POA: Diagnosis not present

## 2023-11-11 DIAGNOSIS — M6281 Muscle weakness (generalized): Secondary | ICD-10-CM | POA: Diagnosis not present

## 2023-11-11 DIAGNOSIS — M25562 Pain in left knee: Secondary | ICD-10-CM | POA: Diagnosis not present

## 2023-11-11 DIAGNOSIS — M25662 Stiffness of left knee, not elsewhere classified: Secondary | ICD-10-CM

## 2023-11-11 DIAGNOSIS — R600 Localized edema: Secondary | ICD-10-CM | POA: Diagnosis not present

## 2023-11-11 NOTE — Therapy (Signed)
 OUTPATIENT PHYSICAL THERAPY TREATMENT    Patient Name: Patricia Lee MRN: [id number] DOB:1945/12/25, 78 y.o., female Today's Date: 11/11/2023  END OF SESSION:  PT End of Session - 11/11/23 1151     Visit Number 13    Date for PT Re-Evaluation 11/24/23    Authorization Type [employer]    Authorization Time Period 09/20/23-11/24/23    Authorization - Visit Number 13    Authorization - Number of Visits 14    PT Start Time 1145    PT Stop Time 1231    PT Time Calculation (min) 46 min    Activity Tolerance Patient tolerated treatment well    Behavior During Therapy [facility] for tasks assessed/performed                         Past Medical History:  Diagnosis Date   Allergy    spring seasonal   Arthritis    hip    Borderline diabetes 12/06/2016   Hyperlipidemia 10/21/2016   Hypertension    Pneumonia 2019   Preventative health care 02/19/2022   Past Surgical History:  Procedure Laterality Date   COLONOSCOPY     JOINT REPLACEMENT  Right hip   POLYPECTOMY     TOTAL HIP ARTHROPLASTY Right 04/09/2016   Procedure: RIGHT TOTAL HIP ARTHROPLASTY ANTERIOR APPROACH;  Surgeon: Claiborne Crew, MD;  Location: [facility];  Service: Orthopedics;  Laterality: Right;   TOTAL KNEE ARTHROPLASTY Left 09/23/2023   Procedure: ARTHROPLASTY, KNEE, TOTAL;  Surgeon: Claiborne Crew, MD;  Location: [facility];  Service: Orthopedics;  Laterality: Left;   Patient Active Problem List   Diagnosis Date Noted   S/P total knee arthroplasty, left 09/23/2023   Pre-op evaluation 08/15/2023   Back pain 06/24/2023   Tinnitus 02/25/2023   COVID-19 01/07/2023   Hyponatremia 01/07/2023   Preventative health care 02/19/2022   Hyperglycemia 02/13/2021   Chronic left shoulder pain 02/13/2021   Borderline diabetes 12/06/2016   Hyperlipidemia 10/21/2016   Overweight (BMI 25.0-29.9) 04/10/2016   S/P right THA, 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   History of skin cancer 07/28/2012   HERPES ZOSTER 11/27/2008   PREMATURE  VENTRICULAR CONTRACTIONS 11/17/2008   Essential hypertension 09/10/2007    PCP: Dorrene Gaucher, NP  REFERRING PROVIDER: Claiborne Crew, MD  REFERRING DIAG: [phone] (ICD-10-CM) - Unilateral primary osteoarthritis, left knee   THERAPY DIAG:  Muscle weakness (generalized)  Acute pain of left knee  Stiffness of left knee, not elsewhere classified  Other abnormalities of gait and mobility  Localized edema  RATIONALE FOR EVALUATION AND TREATMENT: Rehabilitation  ONSET DATE: 09/23/23 - L TKA  NEXT MD VISIT: 11/14/23   SUBJECTIVE:   SUBJECTIVE STATEMENT: Pt reports she has been walking 1.5 miles per day, no problem. Feels like her knee is straightening more. Sometime she has pain along the lateral knee but this goes away throughout the day.  PERTINENT HISTORY: Borderline diabetes, hip arthritis, HTN PAIN:  Are you having pain? Yes: NPRS scale: 0/10  Pain location: N/A Pain description:  N/A Aggravating factors: bending knee, standing too long, turning over in bed, keeping legs straight Relieving factors: not moving  PRECAUTIONS: Knee  RED FLAGS: None   WEIGHT BEARING RESTRICTIONS: No  FALLS:  Has patient fallen in last 6 months? No  LIVING ENVIRONMENT: Lives with: lives alone Lives in: House/apartment Stairs: Yes: Internal: 1 steps; none Has following equipment at home: Single point cane, Walker - 2 wheeled, and elevated toilet seat  OCCUPATION: Retired  PLOF: Independent: [employer] 3x/wk, walk in neighborhood, [employer] trips, walk 2 blocks  PATIENT GOALS: Be able to walk, swim, go to the beach, walk in sand w/o any pain, get back to normal life   OBJECTIVE:  Note: Objective measures were completed at Evaluation unless otherwise noted.  DIAGNOSTIC FINDINGS:   PATIENT SURVEYS:  LEFS 17 / 80 = 21.3 % 10/27/23: 58 / 80 = 72.5 %  COGNITION: Overall cognitive status: Within functional limits for tasks assessed     SENSATION: WFL  EDEMA:   Circumferential:   R = 41.0 cm at 6 cm above patella, 38.5 cm at mid-patella, 34.0 cm at 6 cm below patella L = 46.5 cm at 6 cm above patella, 44.0 cm at mid-patella, 38.5 cm at 6 cm below patella  MUSCLE LENGTH: Hamstrings: mod tight R, mild tight  ITB: mild tight R Piriformis: NT Hip flexors: mild tight R  POSTURE: rounded shoulders, forward head, and flexed trunk   PALPATION:  TTP and pain in L lateral knee & posterior knee/popliteal region  LOWER EXTREMITY ROM:  Active ROM Right eval Left eval L 10/03/23 Supine  L 10/06/23 Supine  L 10/13/23 L 10/20/23  L 11/11/23  Knee flexion 110 81 85 102 103 seated / 106 supine 110  115 supine  Knee extension -9 in LAQ,      0 supported -22 in LAQ,   -21 supported 13 9 9 6  supine, 11 LAQ 9 supine 5 supine after mobs   (Blank rows = not tested)  LOWER EXTREMITY MMT:  MMT Right eval Left eval R 10/23/23 L 10/23/23  Hip flexion      Hip extension 4 4- (tested in S/L) 4 4  Hip abduction 4 3+ 4 4-  Hip adduction 4 4- Sitting 4+ Sitting 4+  Hip internal rotation      Hip external rotation      Knee flexion 4 3+ ^ dull 4+ 4  Knee extension 4 3+ ^ dull 4+ 4  Ankle dorsiflexion 4 4- 4+ 4+   (Blank rows = not tested) ^: pain  FUNCTIONAL TESTS:  Timed up and go (TUG): 18.5 sec with RW (10/01/23)                                                                                                                           TREATMENT DATE:   11/11/23 THERAPEUTIC EXERCISE: To improve strength, endurance, ROM, and flexibility.  Demonstration, verbal and tactile cues throughout for technique.  Rec Bike - L3 x 6 min Leg extension LLE 5lb short arc for TKE 2x10  MANUAL THERAPY: To promote improved flexibility, improved joint mobility, and increased ROM utilizing joint mobilization and scar mobilization. Tiba on femur mobs with knee extended and flexed (traditional mobs) grade III-IV  Knee extension overpressure with heel propped up NEUROMUSCULAR  RE-EDUCATION: To improve balance, coordination, kinesthesia, posture, and proprioception. Standing TKE blue TB 10x5" Standing squat + blue TB TKE  10x3" Standing TKE blue TB + retro step x 20RLE  10/29/23 THERAPEUTIC EXERCISE: To improve strength, endurance, ROM, and flexibility.  Demonstration, verbal and tactile cues throughout for technique.  Rec Bike - L3 x 6 min With 6 inch step:  Standing L heel drop gatsroc stretch 2x30"  Heel drop heel raise 2x10 B Standing runner stretch with femur IR and extension mobilization 3x15" BATCA leg press 20# 2 x 10; 5lb LLE x 10 BATCA knee extension 15# B LE; x 8 RLE 5lb MANUAL THERAPY: To promote improved flexibility, improved joint mobility, and increased ROM utilizing joint mobilization and scar mobilization. STM to hamstring tendons with knee extended Knee extension mobilizations with heel supported by towel 10/27/23 THERAPEUTIC EXERCISE: To improve strength, endurance, ROM, and flexibility.  Demonstration, verbal and tactile cues throughout for technique.  Rec Bike - L3 x 7 min Standing L gastroc stretch in runner stretch position 3 x 30"  THERAPEUTIC ACTIVITIES: To improve functional performance.  Demonstration, verbal and tactile cues throughout for technique. LEFS: 58 / 80 = 72.5 % Stairs: Level of Assistance: Modified independence Stair Negotiation Technique: Alternating Pattern Forwards with Single Rail on Right Number of Stairs: 14  Height of Stairs: 7"  Comments: limited toe clearance on L on ascent, catching toes on edge of step; increased effort on L with stair ascent but only mild eccentric weakness on descent  L LE only, then alt toe clears to 8" step x 20 each - working on improving foot clearance L Forward 8" step-up x 15 - single UE support on counter  L Lateral 8" step-up x 10 - B UE support on counter  B Lateral 8" step-up x 10 - B UE support on counter  L Forward 6" eccentric step-down x 10 - single UE support on counter  L  Lateral 6" eccentric step-down x 10 - B UE support on counter  MANUAL THERAPY: To promote improved flexibility, improved joint mobility, and increased ROM utilizing joint mobilization and scar mobilization.  L knee incisional scar massage along superior half of incision  L knee femorotibial PA mild with slight femoral IR in standing gastroc stretch position  SELF CARE:  Instruction provided for home performance of scar mobilization, cautioning pt to avoid areas where scabbing still present and avoid using lotion or oil until scar fully healed. Reviewed proper resting positions to promote knee extension at home, encouraging use of support ongoing with distal L LE including added pillow or towel roll even when sitting in recliner to encourage maximal knee extension.  Falls  NEUROMUSCULAR RE-EDUCATION: To improve balance, coordination, kinesthesia, posture, and proprioception. L Retro step 2 x 10 - 1 set each with contralateral and ipsilateral arm raise - better L knee extension note with ipsilateral arm raise   10/23/23 THERAPEUTIC EXERCISE: To improve strength, endurance, ROM, and flexibility.  Demonstration, verbal and tactile cues throughout for technique.  Rec Bike - L3 x 8 min LE MMT Gait 600' supervision no AD mild antalgic gait otherwise WFL  NEUROMUSCULAR RE-EDUCATION: To improve coordination, kinesthesia, and proprioception. Standing hip abduction x 10 RTB at thighs B Standing hip extension x 10 RTB at thighs B Squat RTB at thighs 10x3"  MANUAL THERAPY: To promote improved joint mobility and increased ROM Traditional PA mobs for knee extension grade III-IV Knee extension stretching with overpressure    10/20/23 THERAPEUTIC EXERCISE: To improve strength, endurance, ROM, and flexibility.  Demonstration, verbal and tactile cues throughout for technique.  Rec Bike - L3 x 8  min Prone hang x 4 min Prone L TKE 10 x 3-5" Supine leg lengthener + quad set with heel on 6" FR 12 x 3", 2  sets Gravity assisted L knee flexion with L LE supported over PT arms, progressing to contract/relax into L knee flexion PROM Seated LAQ x 10 BATCA leg press 20# 2 x 10 BATCA knee flexion 15# B LE con/ecc x 10, 10# B con/L ecc x 10 BATCA knee extension 5# B LE con/ecc x 10  THERAPEUTIC ACTIVITIES: To improve functional performance.  Demonstration, verbal and tactile cues throughout for technique. TUG: 9.94 sec w/o AD  MANUAL THERAPY: To promote normalized muscle tension, improved flexibility, improved joint mobility, increased ROM, and reduced pain utilizing joint mobilization, connective tissue massage, and therapeutic massage.  STM/DTM and IASTM with roller stick and edge tool to HS and ITB in prone hang position L LE longitudinal distraction in prone hang position L knee tibiofemoral P/A mobs in prone hang position to promote L knee extension L knee femorotibial A/P mobs in supine with FR behind calf to promote L knee extension  NEUROMUSCULAR RE-EDUCATION: To improve coordination, kinesthesia, and proprioception. Standing blue TB TKE 10 x 5", 2 sets  MODALITIES: Moist heat to posterior knee with heel propped on FR for gentle extension stretch x 6 min    PATIENT EDUCATION:  Education details: HEP progression - retro step with ipsilateral lateral arm raise to promote knee extension and scar tissue mobilization  Person educated: Patient Education method: Explanation, Demonstration, Tactile cues, Verbal cues, and Handouts Education comprehension: verbalized understanding, returned demonstration, verbal cues required, tactile cues required, and needs further education  HOME EXERCISE PROGRAM: Access Code: [email or web address] URL: [email or web address] Date: 10/27/2023 Prepared by: Felecia Hopper  Exercises - Supine Hamstring Stretch with Strap (Mirrored)  - 2-3 x daily - 7 x weekly - 3 reps - 30 sec hold - Quad Setting and Stretching  - 2-3 x daily - 7 x weekly - 2 sets - 10 reps  - 3-5 sec hold - Seated Passive Knee Extension  - 2-3 x daily - 7 x weekly - 3 reps - 30-60 sec hold - [place]  - 1 x daily - 5-7 x weekly - 2 sets - 10 reps - Modified Thomas Stretch  - 1 x daily - 5-7 x weekly - 2 sets - 30 hold - Supine Heel Slide with Strap  - 1 x daily - 7 x weekly - 2 sets - 10 reps - Seated Long Arc Quad  - 1 x daily - 7 x weekly - 2 sets - 10 reps - Gastroc Stretch on Wall  - 1 x daily - 7 x weekly - 2 sets - 2 reps - 30 seconds hold - Terminal Knee Extension with Ball and Counter  - 1 x daily - 7 x weekly - 2-3 sets - 10 reps - 5 seconds hold - Prone Knee Extension Hang  - 1 x daily - 7 x weekly - 2 sets - 2 reps - 1 min hold - Prone Quadriceps Stretch with Strap  - 1 x daily - 7 x weekly - 2 sets - 30 hold - Roller Massage Elongated IT Band Release  - 1 x daily - 7 x weekly - 3 sets - Standing Terminal Knee Extension with Resistance  - 1 x daily - 3-4 x weekly - 2 sets - 10 reps - 5 sec hold - Standing Hip Abduction with Resistance at Ankles and Counter Support  - 1  x daily - 3-4 x weekly - 2 sets - 10 reps - Standing Hip Extension with Resistance at Ankles and Counter Support  - 1 x daily - 3-4 x weekly - 2 sets - 10 reps - Retro Step  - 1 x daily - 7 x weekly - 2 sets - 10 reps - 3 sec hold - Anterior Knee Scar Massage  - 2 x daily - 7 x weekly - ~2 min hold  ASSESSMENT:  CLINICAL IMPRESSION: Pt returns after 2 week absence of PT. She has not lost any ROM of her knee, she still remains limited by knee extension which we achieved 5 deg after vigorous stretching. Focused strengthening and motor training on TKE with tactile cues from blue TB. Pt needs new insurance auth next visit as well as MD PN for Friday 11/14/23. Eniya will benefit from continued skilled PT to address ongoing ROM, strength and balance deficits to improve mobility and activity tolerance with decreased pain interference.   OBJECTIVE IMPAIRMENTS: Abnormal gait, decreased activity tolerance,  decreased balance, decreased coordination, decreased endurance, decreased knowledge of condition, decreased knowledge of use of DME, decreased mobility, difficulty walking, decreased ROM, decreased strength, increased edema, increased fascial restrictions, impaired perceived functional ability, increased muscle spasms, impaired flexibility, impaired sensation, impaired tone, improper body mechanics, and pain.   ACTIVITY LIMITATIONS: lifting, bending, sitting, standing, squatting, sleeping, stairs, transfers, bed mobility, bathing, dressing, hygiene/grooming, and locomotion level  PARTICIPATION LIMITATIONS: meal prep, cleaning, laundry, driving, and community activity  PERSONAL FACTORS: Past/current experiences, Time since onset of injury/illness/exacerbation, and 3+ comorbidities: Borderline diabetes, hip arthritis, HTN are also affecting patient's functional outcome.   REHAB POTENTIAL: Good  CLINICAL DECISION MAKING: Evolving/moderate complexity  EVALUATION COMPLEXITY: Moderate   GOALS: Goals reviewed with patient? Yes  SHORT TERM GOALS: Target date: 10/27/2023  1.  Pt will be independent with initial HEP Baseline:  Goal status: MET - 10/20/23  2.  Patient will report at least 25% improvement in L knee pain to improve QOL. Baseline: 5/10 currently, 9.5/10 worst Goal status: MET - 10/16/23 - no pain the past few sessions  3.  Pt will report >/= 26/80 on LEFS (MCID = 9 pts) to demonstrate improved functional ability Baseline: 17 / 80 = 21.3 % Goal status: MET - 10/27/23 - 58 / 80 = 72.5 %  4.  Pt will have improve L knee ROM to 10 to 90 to normalize gait pattern  Baseline: Refer to ROM table Goal status: MET - 10/06/23  5.  Pt will be able to perform TUG <20 sec w/ AD  Baseline: 18.5 sec w/ RW Goal status: MET - 10/20/23 - 9.94 sec w/o AD   LONG TERM GOALS: Target date: 11/24/2023   1.  Pt will be able to perform TUG <=13.5 sec w/o AD Baseline: 18.5 sec w/ RW Goal status: MET -  10/20/23 - 9.94 sec w/o AD  2.  Pt will demonstrate improved L LE strength to >/= 4+/5 for improved stability and ease of mobility. Baseline: Refer to MMT table Goal status: IN PROGRESS - 10/23/23 see MMT chart  3.  Pt will report >/= 35/80 on LEFS (MCID = 9 pts) to demonstrate improved functional ability Baseline: 17 / 80 = 21.3 % Goal status: MET - 10/27/23 - 58 / 80 = 72.5 %  4.  Pt will have improve L knee ROM to 0 to 110 to normalize gait pattern  Baseline:  Goal status: IN PROGRESS - 10/20/23 - L knee AROM  6-110 in supine, 11 L knee extension still lacking in seated LAQ  5.  Pt will be able to ambulate 600" w/o AD with normal gait pattern to return to community walking Baseline:  Goal status: IN PROGRESS - 10/23/23 see under treatment  6.  Pt will be able to sit or stand for 1hr w/o increased pain in L knee Baseline: Quite difficult to do  Goal status: PARTIALLY MET - 10/23/23 - able to stand ~20 min until pain; can sit for an hour   PLAN:  PT FREQUENCY: 2-3x/week - 3x/wk x 2 weeks, tapering to 2x/week for duration of POC   PT DURATION: 8 weeks  PLANNED INTERVENTIONS: 97164- PT Re-evaluation, 97750- Physical Performance Testing, 97110-Therapeutic exercises, 97530- Therapeutic activity, 97112- Neuromuscular re-education, 97535- Self Care, 16109- Manual therapy, [id number]- Gait training, [phone]- Aquatic Therapy, [phone]- Electrical stimulation (manual), 97016- Vasopneumatic device, [id number]- Ultrasound, 91478- Ionotophoresis 4mg /ml Dexamethasone , Patient/Family education, Balance training, Stair training, Taping, Dry Needling, Joint mobilization, Scar mobilization, Compression bandaging, DME instructions, Cryotherapy, and Moist heat  PLAN FOR NEXT SESSION:  focus more on extension/quad activation, continue joint mobs limited mobility in AP direction, progress LE/core strengthening exercises    Naylin Burkle L Nichola Cieslinski, PTA 11/11/2023, 12:38 PM     Date of referral: 08/29/23 Referring provider:  Claiborne Crew, MD Referring diagnosis? M17.12 (ICD-10-CM) - Unilateral primary osteoarthritis, left knee  Treatment diagnosis? (if different than referring diagnosis)  Muscle weakness (generalized)  Acute pain of left knee  Stiffness of left knee, not elsewhere classified  Other abnormalities of gait and mobility  Localized edema  What was this (referring dx) caused by? Surgery (Type: L TKA)  Nature of Condition: Initial Onset (within last 3 months)   Laterality: Lt  Current Functional Measure Score: LEFS 17 / 80 = 21.3 %  Objective measurements identify impairments when they are compared to normal values, the uninvolved extremity, and prior level of function.  [x]  Yes  []  No  Objective assessment of functional ability: Moderate functional limitations   Briefly describe symptoms: L knee dull pain and swelling, limited and painful L knee flexion/extension ROM, B hip discomfort, B LE/core muscle weakness  How did symptoms start: After L TKA surgery on 09/23/23  Average pain intensity:  Last 24 hours: 6/10  Past week: 8/10  How often does the pt experience symptoms? Constantly  How much have the symptoms interfered with usual daily activities? Quite a bit  How has condition changed since care began at this facility? NA - initial visit  In general, how is the patients overall health? Very Good  Onset date: 09/23/23   BACK PAIN (STarT Back Screening Tool) - (When applicable):  Has your back pain spread down your leg(s) at sometime in the last 2 weeks? []  Yes   []  No Have you had pain in the shoulder or neck at sometime in the past 2 weeks? []  Yes   []  No Have you only walked short distances because of your back pain? []  Yes   []  No In the past 2 weeks, have you dressed more slowly than usual because of your back pain? []  Yes   []  No Do you think it is not really safe for person with a condition like yours to be physically active? []  Yes   []  No Have worrying  thoughts been going through your mind a lot of the time? []  Yes   []  No Do you feel that your back pain is terrible and it is never going to  get any better? []  Yes   []  No In general, have you stopped enjoying all the things you usually enjoy? []  Yes   []  No Overall, how bothersome has your back pain been in the last 2 weeks? []  Not at all   []  Slightly     []  Moderate   []  Very much     []  Extremely

## 2023-11-13 ENCOUNTER — Ambulatory Visit

## 2023-11-13 DIAGNOSIS — R600 Localized edema: Secondary | ICD-10-CM | POA: Diagnosis not present

## 2023-11-13 DIAGNOSIS — M25662 Stiffness of left knee, not elsewhere classified: Secondary | ICD-10-CM

## 2023-11-13 DIAGNOSIS — M25562 Pain in left knee: Secondary | ICD-10-CM | POA: Diagnosis not present

## 2023-11-13 DIAGNOSIS — R2689 Other abnormalities of gait and mobility: Secondary | ICD-10-CM | POA: Diagnosis not present

## 2023-11-13 DIAGNOSIS — M6281 Muscle weakness (generalized): Secondary | ICD-10-CM | POA: Diagnosis not present

## 2023-11-13 NOTE — Therapy (Addendum)
 OUTPATIENT PHYSICAL THERAPY TREATMENT / DISCHARGE SUMMARY    Patient Name: Patricia Lee MRN: [id number] DOB:November 23, 1945, 78 y.o., female Today's Date: 11/13/2023  END OF SESSION:  PT End of Session - 11/13/23 1252     Visit Number 14    Date for PT Re-Evaluation 11/24/23    Authorization Type [employer]    Authorization Time Period 09/20/23-11/24/23    Authorization - Visit Number 14    Authorization - Number of Visits 14    PT Start Time 1147    PT Stop Time 1232    PT Time Calculation (min) 45 min    Activity Tolerance Patient tolerated treatment well    Behavior During Therapy [facility] for tasks assessed/performed                          Past Medical History:  Diagnosis Date   Allergy    spring seasonal   Arthritis    hip    Borderline diabetes 12/06/2016   Hyperlipidemia 10/21/2016   Hypertension    Pneumonia 2019   Preventative health care 02/19/2022   Past Surgical History:  Procedure Laterality Date   COLONOSCOPY     JOINT REPLACEMENT  Right hip   POLYPECTOMY     TOTAL HIP ARTHROPLASTY Right 04/09/2016   Procedure: RIGHT TOTAL HIP ARTHROPLASTY ANTERIOR APPROACH;  Surgeon: Donnice Car, MD;  Location: [facility];  Service: Orthopedics;  Laterality: Right;   TOTAL KNEE ARTHROPLASTY Left 09/23/2023   Procedure: ARTHROPLASTY, KNEE, TOTAL;  Surgeon: Car Donnice, MD;  Location: [facility];  Service: Orthopedics;  Laterality: Left;   Patient Active Problem List   Diagnosis Date Noted   S/P total knee arthroplasty, left 09/23/2023   Pre-op evaluation 08/15/2023   Back pain 06/24/2023   Tinnitus 02/25/2023   COVID-19 01/07/2023   Hyponatremia 01/07/2023   Preventative health care 02/19/2022   Hyperglycemia 02/13/2021   Chronic left shoulder pain 02/13/2021   Borderline diabetes 12/06/2016   Hyperlipidemia 10/21/2016   Overweight (BMI 25.0-29.9) 04/10/2016   S/P right THA, 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   History of skin cancer 07/28/2012   HERPES ZOSTER  11/27/2008   PREMATURE VENTRICULAR CONTRACTIONS 11/17/2008   Essential hypertension 09/10/2007    PCP: Daryl Setter, NP  REFERRING PROVIDER: Car Donnice, MD  REFERRING DIAG: [phone] (ICD-10-CM) - Unilateral primary osteoarthritis, left knee   THERAPY DIAG:  Muscle weakness (generalized)  Acute pain of left knee  Stiffness of left knee, not elsewhere classified  Other abnormalities of gait and mobility  Localized edema  RATIONALE FOR EVALUATION AND TREATMENT: Rehabilitation  ONSET DATE: 09/23/23 - L TKA  NEXT MD VISIT: 11/14/23   SUBJECTIVE:   SUBJECTIVE STATEMENT: Pt reports not much change, she feels pretty good with everything  PERTINENT HISTORY: Borderline diabetes, hip arthritis, HTN PAIN:  Are you having pain? Yes: NPRS scale: 0/10  Pain location: N/A Pain description:  N/A Aggravating factors: bending knee, standing too long, turning over in bed, keeping legs straight Relieving factors: not moving  PRECAUTIONS: Knee  RED FLAGS: None   WEIGHT BEARING RESTRICTIONS: No  FALLS:  Has patient fallen in last 6 months? No  LIVING ENVIRONMENT: Lives with: lives alone Lives in: House/apartment Stairs: Yes: Internal: 1 steps; none Has following equipment at home: Single point cane, Walker - 2 wheeled, and elevated toilet seat  OCCUPATION: Retired  PLOF: Independent: [employer] 3x/wk, walk in neighborhood, [employer] trips, walk 2 blocks  PATIENT GOALS: Be  able to walk, swim, go to the beach, walk in sand w/o any pain, get back to normal life   OBJECTIVE:  Note: Objective measures were completed at Evaluation unless otherwise noted.  DIAGNOSTIC FINDINGS:   PATIENT SURVEYS:  LEFS 17 / 80 = 21.3 % 10/27/23: 58 / 80 = 72.5 %  COGNITION: Overall cognitive status: Within functional limits for tasks assessed     SENSATION: WFL  EDEMA:  Circumferential:   R = 41.0 cm at 6 cm above patella, 38.5 cm at mid-patella, 34.0 cm at 6 cm below  patella L = 46.5 cm at 6 cm above patella, 44.0 cm at mid-patella, 38.5 cm at 6 cm below patella  MUSCLE LENGTH: Hamstrings: mod tight R, mild tight  ITB: mild tight R Piriformis: NT Hip flexors: mild tight R  POSTURE: rounded shoulders, forward head, and flexed trunk   PALPATION:  TTP and pain in L lateral knee & posterior knee/popliteal region  LOWER EXTREMITY ROM:  Active ROM Right eval Left eval L 10/03/23 Supine  L 10/06/23 Supine  L 10/13/23 L 10/20/23  L 11/11/23  Knee flexion 110 81 85 102 103 seated / 106 supine 110  115 supine  Knee extension -9 in LAQ,      0 supported -22 in LAQ,   -21 supported 13 9 9 6  supine, 11 LAQ 9 supine 5 supine after mobs   (Blank rows = not tested)  LOWER EXTREMITY MMT:  MMT Right eval Left eval R 10/23/23 L 10/23/23  Hip flexion      Hip extension 4 4- (tested in S/L) 4 4  Hip abduction 4 3+ 4 4-  Hip adduction 4 4- Sitting 4+ Sitting 4+  Hip internal rotation      Hip external rotation      Knee flexion 4 3+ ^ dull 4+ 4  Knee extension 4 3+ ^ dull 4+ 4  Ankle dorsiflexion 4 4- 4+ 4+   (Blank rows = not tested) ^: pain  FUNCTIONAL TESTS:  Timed up and go (TUG): 18.5 sec with RW (10/01/23)                                                                                                                           TREATMENT DATE:   11/13/23 THERAPEUTIC EXERCISE: To improve strength, endurance, ROM, and flexibility.  Demonstration, verbal and tactile cues throughout for technique.  Rec Bike - L3 x 6 min Assessed gait- slightly antalgic with decreased TKE on L Prone hang 2 min 3lb weight; 1 min 3lb weight- measured 3 deg of extension in this position Prone TKE with foam roll under dorsum of foot x 20  MANUAL THERAPY: To promote improved flexibility, improved joint mobility, and increased ROM utilizing joint mobilization and scar mobilization. Tiba on femur mobs with knee extended and flexed (traditional mobs) grade III-IV  Knee extension  overpressure with heel propped up  11/11/23 THERAPEUTIC EXERCISE: To improve strength, endurance, ROM, and flexibility.  Demonstration, verbal and tactile cues throughout for technique.  Rec Bike - L3 x 6 min Leg extension LLE 5lb short arc for TKE 2x10  MANUAL THERAPY: To promote improved flexibility, improved joint mobility, and increased ROM utilizing joint mobilization and scar mobilization. Tiba on femur mobs with knee extended and flexed (traditional mobs) grade III-IV  Knee extension overpressure with heel propped up NEUROMUSCULAR RE-EDUCATION: To improve balance, coordination, kinesthesia, posture, and proprioception. Standing TKE blue TB 10x5 Standing squat + blue TB TKE 10x3 Standing TKE blue TB + retro step x 20RLE  10/29/23 THERAPEUTIC EXERCISE: To improve strength, endurance, ROM, and flexibility.  Demonstration, verbal and tactile cues throughout for technique.  Rec Bike - L3 x 6 min With 6 inch step:  Standing L heel drop gatsroc stretch 2x30  Heel drop heel raise 2x10 B Standing runner stretch with femur IR and extension mobilization 3x15 BATCA leg press 20# 2 x 10; 5lb LLE x 10 BATCA knee extension 15# B LE; x 8 RLE 5lb MANUAL THERAPY: To promote improved flexibility, improved joint mobility, and increased ROM utilizing joint mobilization and scar mobilization. STM to hamstring tendons with knee extended Knee extension mobilizations with heel supported by towel 10/27/23 THERAPEUTIC EXERCISE: To improve strength, endurance, ROM, and flexibility.  Demonstration, verbal and tactile cues throughout for technique.  Rec Bike - L3 x 7 min Standing L gastroc stretch in runner stretch position 3 x 30  THERAPEUTIC ACTIVITIES: To improve functional performance.  Demonstration, verbal and tactile cues throughout for technique. LEFS: 58 / 80 = 72.5 % Stairs: Level of Assistance: Modified independence Stair Negotiation Technique: Alternating Pattern Forwards with Single Rail  on Right Number of Stairs: 14  Height of Stairs: 7  Comments: limited toe clearance on L on ascent, catching toes on edge of step; increased effort on L with stair ascent but only mild eccentric weakness on descent  L LE only, then alt toe clears to 8 step x 20 each - working on improving foot clearance L Forward 8 step-up x 15 - single UE support on counter  L Lateral 8 step-up x 10 - B UE support on counter  B Lateral 8 step-up x 10 - B UE support on counter  L Forward 6 eccentric step-down x 10 - single UE support on counter  L Lateral 6 eccentric step-down x 10 - B UE support on counter  MANUAL THERAPY: To promote improved flexibility, improved joint mobility, and increased ROM utilizing joint mobilization and scar mobilization.  L knee incisional scar massage along superior half of incision  L knee femorotibial PA mild with slight femoral IR in standing gastroc stretch position  SELF CARE:  Instruction provided for home performance of scar mobilization, cautioning pt to avoid areas where scabbing still present and avoid using lotion or oil until scar fully healed. Reviewed proper resting positions to promote knee extension at home, encouraging use of support ongoing with distal L LE including added pillow or towel roll even when sitting in recliner to encourage maximal knee extension.  Falls  NEUROMUSCULAR RE-EDUCATION: To improve balance, coordination, kinesthesia, posture, and proprioception. L Retro step 2 x 10 - 1 set each with contralateral and ipsilateral arm raise - better L knee extension note with ipsilateral arm raise   10/23/23 THERAPEUTIC EXERCISE: To improve strength, endurance, ROM, and flexibility.  Demonstration, verbal and tactile cues throughout for technique.  Rec Bike - L3 x 8 min LE MMT Gait 600' supervision no AD mild antalgic gait otherwise  WFL  NEUROMUSCULAR RE-EDUCATION: To improve coordination, kinesthesia, and proprioception. Standing hip abduction  x 10 RTB at thighs B Standing hip extension x 10 RTB at thighs B Squat RTB at thighs 10x3  MANUAL THERAPY: To promote improved joint mobility and increased ROM Traditional PA mobs for knee extension grade III-IV Knee extension stretching with overpressure    10/20/23 THERAPEUTIC EXERCISE: To improve strength, endurance, ROM, and flexibility.  Demonstration, verbal and tactile cues throughout for technique.  Rec Bike - L3 x 8 min Prone hang x 4 min Prone L TKE 10 x 3-5 Supine leg lengthener + quad set with heel on 6 FR 12 x 3, 2 sets Gravity assisted L knee flexion with L LE supported over PT arms, progressing to contract/relax into L knee flexion PROM Seated LAQ x 10 BATCA leg press 20# 2 x 10 BATCA knee flexion 15# B LE con/ecc x 10, 10# B con/L ecc x 10 BATCA knee extension 5# B LE con/ecc x 10  THERAPEUTIC ACTIVITIES: To improve functional performance.  Demonstration, verbal and tactile cues throughout for technique. TUG: 9.94 sec w/o AD  MANUAL THERAPY: To promote normalized muscle tension, improved flexibility, improved joint mobility, increased ROM, and reduced pain utilizing joint mobilization, connective tissue massage, and therapeutic massage.  STM/DTM and IASTM with roller stick and edge tool to HS and ITB in prone hang position L LE longitudinal distraction in prone hang position L knee tibiofemoral P/A mobs in prone hang position to promote L knee extension L knee femorotibial A/P mobs in supine with FR behind calf to promote L knee extension  NEUROMUSCULAR RE-EDUCATION: To improve coordination, kinesthesia, and proprioception. Standing blue TB TKE 10 x 5, 2 sets  MODALITIES: Moist heat to posterior knee with heel propped on FR for gentle extension stretch x 6 min    PATIENT EDUCATION:  Education details: HEP progression - retro step with ipsilateral lateral arm raise to promote knee extension and scar tissue mobilization  Person educated: Patient Education  method: Explanation, Demonstration, Tactile cues, Verbal cues, and Handouts Education comprehension: verbalized understanding, returned demonstration, verbal cues required, tactile cues required, and needs further education  HOME EXERCISE PROGRAM: Access Code: [email or web address] URL: [email or web address] Date: 10/27/2023 Prepared by: Elijah Hidden  Exercises - Supine Hamstring Stretch with Strap (Mirrored)  - 2-3 x daily - 7 x weekly - 3 reps - 30 sec hold - Quad Setting and Stretching  - 2-3 x daily - 7 x weekly - 2 sets - 10 reps - 3-5 sec hold - Seated Passive Knee Extension  - 2-3 x daily - 7 x weekly - 3 reps - 30-60 sec hold - [place]  - 1 x daily - 5-7 x weekly - 2 sets - 10 reps - Modified Thomas Stretch  - 1 x daily - 5-7 x weekly - 2 sets - 30 hold - Supine Heel Slide with Strap  - 1 x daily - 7 x weekly - 2 sets - 10 reps - Seated Long Arc Quad  - 1 x daily - 7 x weekly - 2 sets - 10 reps - Gastroc Stretch on Wall  - 1 x daily - 7 x weekly - 2 sets - 2 reps - 30 seconds hold - Terminal Knee Extension with Ball and Counter  - 1 x daily - 7 x weekly - 2-3 sets - 10 reps - 5 seconds hold - Prone Knee Extension Hang  - 1 x daily - 7 x weekly - 2 sets -  2 reps - 1 min hold - Prone Quadriceps Stretch with Strap  - 1 x daily - 7 x weekly - 2 sets - 30 hold - Roller Massage Elongated IT Band Release  - 1 x daily - 7 x weekly - 3 sets - Standing Terminal Knee Extension with Resistance  - 1 x daily - 3-4 x weekly - 2 sets - 10 reps - 5 sec hold - Standing Hip Abduction with Resistance at Ankles and Counter Support  - 1 x daily - 3-4 x weekly - 2 sets - 10 reps - Standing Hip Extension with Resistance at Ankles and Counter Support  - 1 x daily - 3-4 x weekly - 2 sets - 10 reps - Retro Step  - 1 x daily - 7 x weekly - 2 sets - 10 reps - 3 sec hold - Anterior Knee Scar Massage  - 2 x daily - 7 x weekly - ~2 min hold  ASSESSMENT:  CLINICAL IMPRESSION: Pt continues to show decreased  L TKE despite reporting functional improvements. Her score on the LEFS has improved from 21.3% to 83.8%. Her pain levels have greatly improved. The only limited factor is lack of TKE on the LLE. As of now we are seeing a ROM 5-115. Patricia Lee will benefit from continued skilled PT to address ongoing ROM, strength and balance deficits to improve mobility and activity tolerance with decreased pain interference.   OBJECTIVE IMPAIRMENTS: Abnormal gait, decreased activity tolerance, decreased balance, decreased coordination, decreased endurance, decreased knowledge of condition, decreased knowledge of use of DME, decreased mobility, difficulty walking, decreased ROM, decreased strength, increased edema, increased fascial restrictions, impaired perceived functional ability, increased muscle spasms, impaired flexibility, impaired sensation, impaired tone, improper body mechanics, and pain.   ACTIVITY LIMITATIONS: lifting, bending, sitting, standing, squatting, sleeping, stairs, transfers, bed mobility, bathing, dressing, hygiene/grooming, and locomotion level  PARTICIPATION LIMITATIONS: meal prep, cleaning, laundry, driving, and community activity  PERSONAL FACTORS: Past/current experiences, Time since onset of injury/illness/exacerbation, and 3+ comorbidities: Borderline diabetes, hip arthritis, HTN are also affecting patient's functional outcome.   REHAB POTENTIAL: Good  CLINICAL DECISION MAKING: Evolving/moderate complexity  EVALUATION COMPLEXITY: Moderate   GOALS: Goals reviewed with patient? Yes  SHORT TERM GOALS: Target date: 10/27/2023  1.  Pt will be independent with initial HEP Baseline:  Goal status: MET - 10/20/23  2.  Patient will report at least 25% improvement in L knee pain to improve QOL. Baseline: 5/10 currently, 9.5/10 worst Goal status: MET - 10/16/23 - no pain the past few sessions  3.  Pt will report >/= 26/80 on LEFS (MCID = 9 pts) to demonstrate improved functional  ability Baseline: 17 / 80 = 21.3 % Goal status: MET - 10/27/23 - 58 / 80 = 72.5 %  4.  Pt will have improve L knee ROM to 10 to 90 to normalize gait pattern  Baseline: Refer to ROM table Goal status: MET - 10/06/23  5.  Pt will be able to perform TUG <20 sec w/ AD  Baseline: 18.5 sec w/ RW Goal status: MET - 10/20/23 - 9.94 sec w/o AD   LONG TERM GOALS: Target date: 11/24/2023   1.  Pt will be able to perform TUG <=13.5 sec w/o AD Baseline: 18.5 sec w/ RW Goal status: MET - 10/20/23 - 9.94 sec w/o AD  2.  Pt will demonstrate improved L LE strength to >/= 4+/5 for improved stability and ease of mobility. Baseline: Refer to MMT table Goal status: IN PROGRESS -  10/23/23 see MMT chart  3.  Pt will report >/= 35/80 on LEFS (MCID = 9 pts) to demonstrate improved functional ability Baseline: 17 / 80 = 21.3 % Goal status: MET - 10/27/23 - 58 / 80 = 72.5 %  4.  Pt will have improve L knee ROM to 0 to 110 to normalize gait pattern  Baseline:  Goal status: IN PROGRESS - 10/20/23 - L knee AROM 6-110 in supine, 11 L knee extension still lacking in seated LAQ  5.  Pt will be able to ambulate 600 w/o AD with normal gait pattern to return to community walking Baseline:  Goal status: IN PROGRESS - 10/23/23 see under treatment  6.  Pt will be able to sit or stand for 1hr w/o increased pain in L knee Baseline: Quite difficult to do  Goal status: PARTIALLY MET - 10/23/23 - able to stand ~20 min until pain; can sit for an hour   PLAN:  PT FREQUENCY: 2-3x/week - 3x/wk x 2 weeks, tapering to 2x/week for duration of POC   PT DURATION: 8 weeks  PLANNED INTERVENTIONS: 02835- PT Re-evaluation, 97750- Physical Performance Testing, 97110-Therapeutic exercises, 97530- Therapeutic activity, [id number]- Neuromuscular re-education, 97535- Self Care, 02859- Manual therapy, [id number]- Gait training, [phone]- Aquatic Therapy, [phone]- Electrical stimulation (manual), [id number]- Vasopneumatic device, [id number]- Ultrasound, 02966-  Ionotophoresis 4mg /ml Dexamethasone , Patient/Family education, Balance training, Stair training, Taping, Dry Needling, Joint mobilization, Scar mobilization, Compression bandaging, DME instructions, Cryotherapy, and Moist heat  PLAN FOR NEXT SESSION:  focus more on extension/quad activation, continue joint mobs limited mobility in AP direction, progress LE/core strengthening exercises    [place] L Najah Liverman, PTA 11/13/2023, 1:08 PM    PHYSICAL THERAPY DISCHARGE SUMMARY  Visits from Start of Care: 14  Current functional level related to goals / functional outcomes: Refer to above clinical impression and goal assessment for status as of last visit on 11/13/2023. Patient was released from PT by MD as a follow-up on 11/14/2023, therefore will proceed with discharge from PT for this episode.     Remaining deficits: As above.   Education / Equipment: HEP  Patient agrees to discharge. Patient goals were partially met. Patient is being discharged due to the physician's request.  Patricia Lee, PT 05/12/2024, 3:34 PM  [facility] [address]  [address] [place], [place], [address] Phone: [phone]   Fax:  [phone]    Date of referral: 08/29/23 Referring provider: Ernie Cough, MD Referring diagnosis? M17.12 (ICD-10-CM) - Unilateral primary osteoarthritis, left knee  Treatment diagnosis? (if different than referring diagnosis)  Muscle weakness (generalized)  Acute pain of left knee  Stiffness of left knee, not elsewhere classified  Other abnormalities of gait and mobility  Localized edema  What was this (referring dx) caused by? Surgery (Type: L TKA)  Nature of Condition: Initial Onset (within last 3 months)   Laterality: Lt  Current Functional Measure Score: LEFS Lower Extremity Functional Score: 67 / 80 = 83.8 %  Objective measurements identify impairments when they are compared to normal values, the uninvolved  extremity, and prior level of function.  [x]  Yes  []  No  Objective assessment of functional ability: Moderate functional limitations   Briefly describe symptoms: L knee dull pain and swelling, limited and painful L knee flexion/extension ROM, B hip discomfort, B LE/core muscle weakness  How did symptoms start: After L TKA surgery on 09/23/23  Average pain intensity:  Last 24 hours: 1/10  Past week: 1/10  How  often does the pt experience symptoms? Occasionally  How much have the symptoms interfered with usual daily activities? Not at all  How has condition changed since care began at this facility? Much better  In general, how is the patients overall health? Very Good  Onset date: 09/23/23   BACK PAIN (STarT Back Screening Tool) - (When applicable):  Has your back pain spread down your leg(s) at sometime in the last 2 weeks? []  Yes   []  No Have you had pain in the shoulder or neck at sometime in the past 2 weeks? []  Yes   []  No Have you only walked short distances because of your back pain? []  Yes   []  No In the past 2 weeks, have you dressed more slowly than usual because of your back pain? []  Yes   []  No Do you think it is not really safe for person with a condition like yours to be physically active? []  Yes   []  No Have worrying thoughts been going through your mind a lot of the time? []  Yes   []  No Do you feel that your back pain is terrible and it is never going to get any better? []  Yes   []  No In general, have you stopped enjoying all the things you usually enjoy? []  Yes   []  No Overall, how bothersome has your back pain been in the last 2 weeks? []  Not at all   []  Slightly     []  Moderate   []  Very much     []  Extremely

## 2023-11-14 DIAGNOSIS — Z96652 Presence of left artificial knee joint: Secondary | ICD-10-CM | POA: Diagnosis not present

## 2023-11-14 DIAGNOSIS — Z471 Aftercare following joint replacement surgery: Secondary | ICD-10-CM | POA: Diagnosis not present

## 2023-11-17 ENCOUNTER — Encounter: Admitting: Physical Therapy

## 2023-11-20 ENCOUNTER — Encounter

## 2023-11-22 ENCOUNTER — Other Ambulatory Visit: Payer: Self-pay | Admitting: Family

## 2023-11-24 ENCOUNTER — Other Ambulatory Visit: Payer: Self-pay | Admitting: Family

## 2023-11-24 ENCOUNTER — Encounter: Admitting: Physical Therapy

## 2023-12-23 ENCOUNTER — Ambulatory Visit (INDEPENDENT_AMBULATORY_CARE_PROVIDER_SITE_OTHER): Payer: Medicare Other | Admitting: Family

## 2023-12-23 VITALS — BP 133/57 | HR 60 | Temp 97.6°F | Resp 16 | Ht 67.0 in | Wt 169.4 lb

## 2023-12-23 DIAGNOSIS — E785 Hyperlipidemia, unspecified: Secondary | ICD-10-CM | POA: Diagnosis not present

## 2023-12-23 DIAGNOSIS — I10 Essential (primary) hypertension: Secondary | ICD-10-CM | POA: Diagnosis not present

## 2023-12-23 DIAGNOSIS — R739 Hyperglycemia, unspecified: Secondary | ICD-10-CM

## 2023-12-23 LAB — BASIC METABOLIC PANEL WITH GFR
BUN: 27 mg/dL — ABNORMAL HIGH (ref 6–23)
CO2: 33 meq/L — ABNORMAL HIGH (ref 19–32)
Calcium: 10.6 mg/dL — ABNORMAL HIGH (ref 8.4–10.5)
Chloride: 101 meq/L (ref 96–112)
Creatinine, Ser: 0.96 mg/dL (ref 0.40–1.20)
GFR: 56.69 mL/min — ABNORMAL LOW (ref >60.00)
Glucose, Bld: 109 mg/dL — ABNORMAL HIGH (ref 70–99)
Potassium: 5.2 meq/L — ABNORMAL HIGH (ref 3.5–5.1)
Sodium: 141 meq/L (ref 135–145)

## 2023-12-23 LAB — HEMOGLOBIN A1C: Hgb A1c MFr Bld: 6.2 % (ref 4.6–6.5)

## 2023-12-23 MED ORDER — CHLORTHALIDONE 25 MG PO TABS: 25.0000 mg | ORAL_TABLET | Freq: Every day | ORAL | 1 refills | Status: DC

## 2023-12-23 NOTE — Patient Instructions (Signed)
 VISIT SUMMARY:  You had a follow-up visit to monitor your hypertension, hyperlipidemia, and diabetes. Your blood pressure and cholesterol levels are well-controlled, and your A1c has improved. We discussed your concerns about kidney function and the Shingrix  vaccine.  YOUR PLAN:  TYPE 2 DIABETES MELLITUS: Your A1c has improved to 5.7%, indicating good blood sugar control. -We will order a hemoglobin A1c test to continue monitoring your blood sugar levels.  HYPERTENSION: Your blood pressure is well-controlled. The swelling in your left ankle is likely related to your recent surgery. -Continue taking amlodipine  2.5 mg twice daily. -Continue taking chlorthalidone  once daily. -We have refilled your chlorthalidone  prescription. -We discussed potential causes of your ankle swelling, including your surgery and medication effects.  ABNORMAL KIDNEY TEST: Your previous high kidney function tests were likely due to NSAID use. Current ibuprofen use may still affect your kidney function. -We will order kidney function tests to monitor your kidney health. -Try alternative pain management options like Tylenol  for arthritis.  HYPERLIPIDEMIA: Your cholesterol levels are well-controlled with your current medication. -Continue taking atorvastatin  10 mg daily.  SHINGLES VACCINATION: You have concerns about the Shingrix  vaccine, but it significantly reduces the risk of shingles recurrence. -Consider getting the Shingrix  vaccine. We discussed its benefits and the very low risk of serious side effects.  GENERAL HEALTH MAINTENANCE: We discussed the importance of healthy eating habits, especially during the summer.  FOLLOW-UP: We need to continue monitoring your chronic conditions. -Schedule a follow-up appointment in six months. -Stop by the lab for the ordered tests.

## 2023-12-23 NOTE — Assessment & Plan Note (Signed)
 BP Readings from Last 3 Encounters:  12/23/23 (!) 133/57  09/24/23 (!) 129/55  09/11/23 (!) 164/64   Stable on amlodipine  2.5mg  bid and daily chlorthalidone .  Continue same.

## 2023-12-23 NOTE — Progress Notes (Signed)
 Subjective:     Patient ID: Patricia Lee, female    DOB: March 31, 1946, 78 y.o.   MRN: [id number]  Chief Complaint  Patient presents with   Follow-up    Here for 6 month follow up for high blood sugar    HPI  Discussed the use of AI scribe software for clinical note transcription with the patient, who gave verbal consent to proceed.  History of Present Illness  Patricia Lee is a 78 year old female with hypertension and hyperlipidemia who presents for a follow-up visit.  Her blood pressure at home is 122/70 mmHg, and in the office, it is 133/57 mmHg. She takes amlodipine  2.5 mg twice daily and chlorthalidone  once daily. She experiences evening swelling in her left ankle, which began post-surgery on the left leg.  Her A1c improved from 6.3% to 5.7% without changes to her routine. She takes atorvastatin  10 mg for hyperlipidemia, with her last LDL at 91 mg/dL in January.  She is concerned about kidney function, previously noted as high but now normal. She was taking Aleve at the time of the elevated levels. Currently, she takes 200 mg of ibuprofen before bed.  She has had shingles and received the zostavax vaccine but not the Shingrix  vaccine due to concerns about side effects.        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/09/2016   Procedure: RIGHT TOTAL HIP ARTHROPLASTY ANTERIOR APPROACH;  Surgeon: Donnice Car, MD;  Location: [facility];  Service:  Orthopedics;  Laterality: Right;   TOTAL KNEE ARTHROPLASTY Left 09/23/2023   Procedure: ARTHROPLASTY, KNEE, TOTAL;  Surgeon: Car Donnice, MD;  Location: [facility];  Service: Orthopedics;  Laterality: Left;    Family History  Problem Relation Age of Onset   Stomach cancer Mother        died at 41   Arthritis Mother    Cancer Mother    Colon cancer Father        died at 53   Cancer Father    Heart failure Maternal Grandmother    Cancer Paternal Grandmother    Colon polyps Neg Hx    Rectal cancer Neg Hx    Esophageal cancer Neg Hx    Pancreatic cancer Neg Hx    Liver disease Neg Hx     Social History   Socioeconomic History   Marital status: Widowed    Spouse name: Not on file   Number of children: Not on file   Years of education: Not on file   Highest education level: Associate degree: occupational, Scientist, product/process development, or vocational program  Occupational History   Occupation: retired  Tobacco Use   Smoking status: Former    Current packs/day: 0.00    Average packs/day: 0.3 packs/day for 20.0 years (5.0 ttl pk-yrs)    Types: Cigarettes    Start date: 06/10/1973  Quit date: 06/10/1993    Years since quitting: 30.5   Smokeless tobacco: Never  Vaping Use   Vaping status: Never Used  Substance and Sexual Activity   Alcohol use: Yes    Alcohol/week: 7.0 standard drinks of alcohol    Types: 7 Glasses of wine per week    Comment: wine, 1-2 glasses every other day   Drug use: No   Sexual activity: Not Currently    Birth control/protection: Post-menopausal  Other Topics Concern   Not on file  Social History Narrative   2 sons, one in [place] and one in [place].    Retired from [employer]   [facility] times a [facility]   Active in her church   Husband died in January 19, 2013   Social Drivers of Health   Financial Resource Strain: Low Risk  (12/17/2023)   Overall Financial Resource Strain (CARDIA)    Difficulty of Paying Living Expenses: Not hard at all  Food  Insecurity: No Food Insecurity (12/17/2023)   Hunger Vital Sign    Worried About Running Out of Food in the Last Year: Never true    Ran Out of Food in the Last Year: Never true  Transportation Needs: No Transportation Needs (12/17/2023)   PRAPARE - Administrator, Civil Service (Medical): No    Lack of Transportation (Non-Medical): No  Physical Activity: Sufficiently Active (12/17/2023)   Exercise Vital Sign    Days of Exercise per Week: 4 days    Minutes of Exercise per Session: 40 min  Stress: No Stress Concern Present (12/17/2023)   [employer] of Occupational Health - Occupational Stress Questionnaire    Feeling of Stress: Only a little  Social Connections: Moderately Integrated (12/17/2023)   Social Connection and Isolation Panel    Frequency of Communication with Friends and Family: More than three times a week    Frequency of Social Gatherings with Friends and Family: Twice a week    Attends Religious Services: More than 4 times per year    Active Member of [employer] or Organizations: Yes    Attends Banker Meetings: More than 4 times per year    Marital Status: Widowed  Intimate Partner Violence: Not At Risk (09/23/2023)   Humiliation, Afraid, Rape, and Kick questionnaire    Fear of Current or Ex-Partner: No    Emotionally Abused: No    Physically Abused: No    Sexually Abused: No    Outpatient Medications Prior to Visit  Medication Sig Dispense Refill   amLODipine  (NORVASC ) 2.5 MG tablet TAKE 1 TABLET BY MOUTH TWICE  DAILY 180 tablet 3   amoxicillin  (AMOXIL ) 500 MG tablet Take 4 tablets by mouth 1 hour prior to dental work 4 tablet 2   APPLE CIDER VINEGAR PO Take 1 tablet by mouth daily.     atorvastatin  (LIPITOR) 10 MG tablet TAKE 1 TABLET BY MOUTH DAILY 100 tablet 1   betamethasone  dipropionate 0.05 % cream Apply topically 2 (two) times daily. (Patient taking differently: Apply 1 Application topically daily as needed (flare up).) 30 g 0    Cholecalciferol (VITAMIN D) 50 MCG (2000 UT) tablet Take 2,000 Units by mouth daily.     Collagen-Vitamin C-Biotin (COLLAGEN PO) Take 2,500 mg by mouth daily.     gabapentin  (NEURONTIN ) 300 MG capsule Take 1 capsule (300 mg total) by mouth once daily as needed (back pain). 30 capsule 1   Multiple Vitamins-Minerals (MULTIVITAMIN WITH MINERALS) tablet Take  1 tablet by mouth daily.     Omega-3 Fatty Acids ([facility] FISH OIL) 1200 MG CAPS Take 1,200 mg by mouth daily.     polycarbophil (FIBERCON) 625 MG tablet Take 625 mg by mouth daily.     Turmeric 500 MG CAPS Take 500 mg by mouth daily.     chlorthalidone  (HYGROTON ) 25 MG tablet Take 1 tablet (25 mg total) by mouth daily. 90 tablet 0   methocarbamol  (ROBAXIN ) 500 MG tablet Take 1 tablet (500 mg total) by mouth every 6 (six) hours as needed for muscle spasms. (Patient not taking: Reported on 12/23/2023) 40 tablet 2   oxyCODONE  (OXY IR/ROXICODONE ) 5 MG immediate release tablet Take 1 tablet (5 mg total) by mouth every 4 (four) hours as needed for severe pain (pain score 7-10). (Patient not taking: Reported on 12/23/2023) 42 tablet 0   oxyCODONE  (OXY IR/ROXICODONE ) 5 MG immediate release tablet Take 1 tablet by mouth every 4-6 hours as needed for severe post op pain (Patient not taking: Reported on 12/23/2023) 42 tablet 0   oxyCODONE  (OXY IR/ROXICODONE ) 5 MG immediate release tablet Take 1 tablet by mouth every 4-6 hours as needed for severe post op pain (Patient not taking: Reported on 12/23/2023) 30 tablet 0   polyethylene glycol powder (GLYCOLAX /MIRALAX ) 17 GM/SCOOP powder Dissolve 17 g in liquid and take by mouth 2 (two) times daily. (Patient not taking: Reported on 12/23/2023) 238 g 0   No facility-administered medications prior to visit.    Allergies  Allergen Reactions   Codeine Other (See Comments)    Tachycardia and insomnia   Erythromycin Other (See Comments)    Tachycardia and insomnia   Influenza Vaccines Other (See Comments)    Nerve reaction  similar to shingles- all nerves in back on fire    Prednisone Other (See Comments)    Tachycardia and insomnia   Tetracyclines & Related Other (See Comments)    Tachycardia/insomnia    ROS See HPI    Objective:    Physical Exam Constitutional:      General: She is not in acute distress.    Appearance: Normal appearance. She is well-developed.  HENT:     Head: Normocephalic and atraumatic.     Right Ear: External ear normal.     Left Ear: External ear normal.  Eyes:     General: No scleral icterus. Neck:     Thyroid : No thyromegaly.  Cardiovascular:     Rate and Rhythm: Normal rate and regular rhythm.     Heart sounds: Normal heart sounds. No murmur heard. Pulmonary:     Effort: Pulmonary effort is normal. No respiratory distress.     Breath sounds: Normal breath sounds. No wheezing.  Musculoskeletal:     Cervical back: Neck supple.     Comments: 1+ LLE swelling  Skin:    General: Skin is warm and dry.  Neurological:     Mental Status: She is alert and oriented to person, place, and time.  Psychiatric:        Mood and Affect: Mood normal.        Behavior: Behavior normal.        Thought Content: Thought content normal.        Judgment: Judgment normal.      BP (!) 133/57   Pulse 60   Temp 97.6 F (36.4 C) (Oral)   Resp 16   Ht 5' 7 (1.702 m)   Wt 169 lb 6.4 oz (76.8 kg)   SpO2  98%   BMI 26.53 kg/m  Wt Readings from Last 3 Encounters:  12/23/23 169 lb 6.4 oz (76.8 kg)  09/23/23 173 lb 1 oz (78.5 kg)  09/11/23 173 lb 1 oz (78.5 kg)       Assessment & Plan:   Problem List Items Addressed This Visit       Unprioritized   Hyperlipidemia   Lab Results  Component Value Date   CHOL 193 06/24/2023   HDL 84.70 06/24/2023   LDLCALC 91 06/24/2023   LDLDIRECT 136.2 07/21/2012   TRIG 83.0 06/24/2023   CHOLHDL 2 06/24/2023   At goal on lipitor. Continue same.       Relevant Medications   chlorthalidone  (HYGROTON ) 25 MG tablet   Hyperglycemia    Update A1C.  Last A1C was much improved. Update today.      Relevant Orders   HgB A1c   Essential hypertension - Primary   BP Readings from Last 3 Encounters:  12/23/23 (!) 133/57  09/24/23 (!) 129/55  09/11/23 (!) 164/64   Stable on amlodipine  2.5mg  bid and daily chlorthalidone .  Continue same.       Relevant Medications   chlorthalidone  (HYGROTON ) 25 MG tablet   Other Relevant Orders   Basic Metabolic Panel (BMET)    I have discontinued Skilar L. Gordan's oxyCODONE , polyethylene glycol powder, methocarbamol , oxyCODONE , and oxyCODONE . I am also having her maintain her multivitamin with minerals, [facility] Fish Oil, Turmeric, Vitamin D, polycarbophil, betamethasone  dipropionate, gabapentin , Collagen-Vitamin C-Biotin (COLLAGEN PO), APPLE CIDER VINEGAR PO, amoxicillin , atorvastatin , amLODipine , and chlorthalidone .  Meds ordered this encounter  Medications   chlorthalidone  (HYGROTON ) 25 MG tablet    Sig: Take 1 tablet (25 mg total) by mouth daily.    Dispense:  90 tablet    Refill:  1    Supervising Provider:   DOMENICA BLACKBIRD A [[id number]]

## 2023-12-23 NOTE — Assessment & Plan Note (Signed)
 Lab Results  Component Value Date   CHOL 193 06/24/2023   HDL 84.70 06/24/2023   LDLCALC 91 06/24/2023   LDLDIRECT 136.2 07/21/2012   TRIG 83.0 06/24/2023   CHOLHDL 2 06/24/2023   At goal on lipitor. Continue same.

## 2023-12-23 NOTE — Assessment & Plan Note (Addendum)
 Update A1C.  Last A1C was much improved. Update today.

## 2023-12-25 ENCOUNTER — Other Ambulatory Visit: Payer: Self-pay

## 2023-12-25 ENCOUNTER — Telehealth: Payer: Self-pay | Admitting: Family

## 2023-12-25 DIAGNOSIS — E875 Hyperkalemia: Secondary | ICD-10-CM

## 2023-12-25 DIAGNOSIS — D692 Other nonthrombocytopenic purpura: Secondary | ICD-10-CM | POA: Diagnosis not present

## 2023-12-25 DIAGNOSIS — Z85828 Personal history of other malignant neoplasm of skin: Secondary | ICD-10-CM | POA: Diagnosis not present

## 2023-12-25 DIAGNOSIS — L821 Other seborrheic keratosis: Secondary | ICD-10-CM | POA: Diagnosis not present

## 2023-12-25 DIAGNOSIS — D1801 Hemangioma of skin and subcutaneous tissue: Secondary | ICD-10-CM | POA: Diagnosis not present

## 2023-12-25 DIAGNOSIS — D2372 Other benign neoplasm of skin of left lower limb, including hip: Secondary | ICD-10-CM | POA: Diagnosis not present

## 2023-12-25 DIAGNOSIS — L814 Other melanin hyperpigmentation: Secondary | ICD-10-CM | POA: Diagnosis not present

## 2023-12-25 DIAGNOSIS — Z08 Encounter for follow-up examination after completed treatment for malignant neoplasm: Secondary | ICD-10-CM | POA: Diagnosis not present

## 2023-12-25 NOTE — Telephone Encounter (Signed)
 Potassium was a little high.  Please repeat bmet. Dx hyperkalemia.  A1C has risen since last check but still remains at goal.

## 2023-12-30 DIAGNOSIS — Z1231 Encounter for screening mammogram for malignant neoplasm of breast: Secondary | ICD-10-CM | POA: Diagnosis not present

## 2023-12-30 LAB — HM MAMMOGRAPHY

## 2023-12-31 NOTE — Telephone Encounter (Signed)
 Conversation not documented? However Pt is coming tomorrow, 7/24 or repeat bmp.

## 2024-01-01 ENCOUNTER — Encounter: Payer: Self-pay | Admitting: Family

## 2024-01-01 ENCOUNTER — Other Ambulatory Visit (HOSPITAL_BASED_OUTPATIENT_CLINIC_OR_DEPARTMENT_OTHER): Payer: Self-pay

## 2024-01-01 ENCOUNTER — Ambulatory Visit: Payer: Self-pay | Admitting: Family

## 2024-01-01 ENCOUNTER — Other Ambulatory Visit (INDEPENDENT_AMBULATORY_CARE_PROVIDER_SITE_OTHER)

## 2024-01-01 DIAGNOSIS — E875 Hyperkalemia: Secondary | ICD-10-CM | POA: Diagnosis not present

## 2024-01-01 LAB — BASIC METABOLIC PANEL WITH GFR
BUN: 21 mg/dL (ref 6–23)
CO2: 31 meq/L (ref 19–32)
Calcium: 10 mg/dL (ref 8.4–10.5)
Chloride: 100 meq/L (ref 96–112)
Creatinine, Ser: 0.99 mg/dL (ref 0.40–1.20)
GFR: 54.63 mL/min — ABNORMAL LOW (ref >60.00)
Glucose, Bld: 127 mg/dL — ABNORMAL HIGH (ref 70–99)
Potassium: 4.1 meq/L (ref 3.5–5.1)
Sodium: 139 meq/L (ref 135–145)

## 2024-01-22 ENCOUNTER — Ambulatory Visit (INDEPENDENT_AMBULATORY_CARE_PROVIDER_SITE_OTHER)

## 2024-01-22 DIAGNOSIS — Z0000 Encounter for general adult medical examination without abnormal findings: Secondary | ICD-10-CM | POA: Diagnosis not present

## 2024-01-22 NOTE — Progress Notes (Signed)
 Subjective:   Patricia Lee is a 78 y.o. who presents for a Medicare Wellness preventive visit.  As a reminder, Annual Wellness Visits don't include a physical exam, and some assessments may be limited, especially if this visit is performed virtually. We may recommend an in-person follow-up visit with your provider if needed.  Visit Complete: Virtual I connected with  Patricia Lee on 01/22/24 by a audio enabled telemedicine application and verified that I am speaking with the correct person using two identifiers.  Patient Location: Home  Provider Location: Home Office  I discussed the limitations of evaluation and management by telemedicine. The patient expressed understanding and agreed to proceed.  Vital Signs: Because this visit was a virtual/telehealth visit, some criteria may be missing or patient reported. Any vitals not documented were not able to be obtained and vitals that have been documented are patient reported.  VideoError- Librarian, academic were attempted between this provider and patient, however failed, due to patient having technical difficulties OR patient did not have access to video capability.  We continued and completed visit with audio only.   Persons Participating in Visit: Patient.  AWV Questionnaire: Yes: Patient Medicare AWV questionnaire was completed by the patient on 01/19/2024; I have confirmed that all information answered by patient is correct and no changes since this date.  Cardiac Risk Factors include: advanced age (>79men, >61 women);dyslipidemia;hypertension     Objective:    Today's Vitals   There is no height or weight on file to calculate BMI.     01/22/2024    9:32 AM 09/29/2023   11:03 AM 09/23/2023   12:05 PM 09/11/2023   10:45 AM 01/01/2023   10:59 AM 10/07/2022    1:04 PM 09/27/2021   11:54 AM  Advanced Directives  Does Patient Have a Medical Advance Directive? Yes Yes Yes Yes No Yes Yes  Type of Sports coach of [place];Living will Healthcare Power of [employer] Power of [employer] Power of [employer] Power of [place];Living will Healthcare Power of [place];Living will  Does patient want to make changes to medical advance directive?  No - Patient declined No - Patient declined   No - Patient declined   Copy of Healthcare Power of Attorney in Chart? Yes - validated most recent copy scanned in chart (See row information) Yes - validated most recent copy scanned in chart (See row information) Yes - validated most recent copy scanned in chart (See row information) Yes - validated most recent copy scanned in chart (See row information)  Yes - validated most recent copy scanned in chart (See row information) No - copy requested  Would patient like information on creating a medical advance directive?     No - Patient declined      Current Medications (verified) Outpatient Encounter Medications as of 01/22/2024  Medication Sig   amLODipine  (NORVASC ) 2.5 MG tablet TAKE 1 TABLET BY MOUTH TWICE  DAILY   amoxicillin  (AMOXIL ) 500 MG tablet Take 4 tablets by mouth 1 hour prior to dental work   APPLE CIDER VINEGAR PO Take 1 tablet by mouth daily.   atorvastatin  (LIPITOR) 10 MG tablet TAKE 1 TABLET BY MOUTH DAILY   chlorthalidone  (HYGROTON ) 25 MG tablet Take 1 tablet (25 mg total) by mouth daily.   Cholecalciferol (VITAMIN D) 50 MCG (2000 UT) tablet Take 2,000 Units by mouth daily.   Collagen-Vitamin C-Biotin (COLLAGEN PO) Take 2,500 mg by mouth daily.   gabapentin  (  NEURONTIN ) 300 MG capsule Take 1 capsule (300 mg total) by mouth once daily as needed (back pain).   Multiple Vitamins-Minerals (MULTIVITAMIN WITH MINERALS) tablet Take 1 tablet by mouth daily.   Omega-3 Fatty Acids (CVS FISH OIL) 1200 MG CAPS Take 1,200 mg by mouth daily.   polycarbophil (FIBERCON) 625 MG tablet Take 625 mg by mouth daily.   Turmeric 500 MG CAPS Take 500 mg by mouth daily.    betamethasone  dipropionate 0.05 % cream Apply topically 2 (two) times daily. (Patient taking differently: Apply 1 Application topically daily as needed (flare up).)   No facility-administered encounter medications on file as of 01/22/2024.    Allergies (verified) Codeine, Erythromycin, Influenza vaccines, Prednisone, and Tetracyclines & related   History: Past Medical History:  Diagnosis Date   Allergy    spring seasonal   Arthritis    hip    Borderline diabetes 12/06/2016   Hyperlipidemia 10/21/2016   Hypertension    Pneumonia 2018/02/20   Preventative health care 02/19/2022   Past Surgical History:  Procedure Laterality Date   COLONOSCOPY     JOINT REPLACEMENT  Right hip   POLYPECTOMY     TOTAL HIP ARTHROPLASTY Right 04/09/2016   Procedure: RIGHT TOTAL HIP ARTHROPLASTY ANTERIOR APPROACH;  Surgeon: Donnice Car, MD;  Location: [facility];  Service: Orthopedics;  Laterality: Right;   TOTAL KNEE ARTHROPLASTY Left 09/23/2023   Procedure: ARTHROPLASTY, KNEE, TOTAL;  Surgeon: Car Donnice, MD;  Location: [facility];  Service: Orthopedics;  Laterality: Left;   Family History  Problem Relation Age of Onset   Stomach cancer Mother        died at 86   Arthritis Mother    Cancer Mother    Varicose Veins Mother    Colon cancer Father        died at 67   Cancer Father    Heart failure Maternal Grandmother    Cancer Paternal Grandmother    Colon polyps Neg Hx    Rectal cancer Neg Hx    Esophageal cancer Neg Hx    Pancreatic cancer Neg Hx    Liver disease Neg Hx    Social History   Socioeconomic History   Marital status: Widowed    Spouse name: Not on file   Number of children: Not on file   Years of education: Not on file   Highest education level: Associate degree: occupational, Scientist, product/process development, or vocational program  Occupational History   Occupation: retired  Tobacco Use   Smoking status: Former    Current packs/day: 0.00    Average packs/day: 0.3 packs/day for 23.3 years (6.0 ttl  pk-yrs)    Types: Cigarettes    Start date: 06/10/1973    Quit date: 06/10/1993    Years since quitting: 30.6   Smokeless tobacco: Never  Vaping Use   Vaping status: Never Used  Substance and Sexual Activity   Alcohol use: Yes    Alcohol/week: 7.0 standard drinks of alcohol    Types: 7 Glasses of wine per week    Comment: wine, 1-2 glasses every other day   Drug use: No   Sexual activity: Not Currently    Birth control/protection: Post-menopausal  Other Topics Concern   Not on file  Social History Narrative   2 sons, one in [place] and one in [place].    Retired from [employer]   Enjoys going to the Y 3 times a week   Active in her church   Husband died in Feb 20, 2013  Social Drivers of Corporate investment banker Strain: Low Risk  (01/22/2024)   Overall Financial Resource Strain (CARDIA)    Difficulty of Paying Living Expenses: Not hard at all  Food Insecurity: No Food Insecurity (01/22/2024)   Hunger Vital Sign    Worried About Running Out of Food in the Last Year: Never true    Ran Out of Food in the Last Year: Never true  Transportation Needs: No Transportation Needs (01/22/2024)   PRAPARE - Administrator, Civil Service (Medical): No    Lack of Transportation (Non-Medical): No  Physical Activity: Sufficiently Active (01/22/2024)   Exercise Vital Sign    Days of Exercise per Week: 6 days    Minutes of Exercise per Session: 60 min  Stress: No Stress Concern Present (01/22/2024)   [employer] of Occupational Health - Occupational Stress Questionnaire    Feeling of Stress: Not at all  Social Connections: Moderately Integrated (01/22/2024)   Social Connection and Isolation Panel    Frequency of Communication with Friends and Family: More than three times a week    Frequency of Social Gatherings with Friends and Family: More than three times a week    Attends Religious Services: More than 4 times per year    Active Member of [employer] or Organizations:  Yes    Attends Banker Meetings: More than 4 times per year    Marital Status: Widowed    Tobacco Counseling Counseling given: Not Answered    Clinical Intake:  Pre-visit preparation completed: Yes  Pain : No/denies pain     Nutritional Risks: None Diabetes: No  Lab Results  Component Value Date   HGBA1C 6.2 12/23/2023   HGBA1C 5.7 (H) 09/11/2023   HGBA1C 6.3 06/24/2023     How often do you need to have someone help you when you read instructions, pamphlets, or other written materials from your doctor or pharmacy?: 1 - Never  Interpreter Needed?: No  Information entered by :: NAllen LPN   Activities of Daily Living     01/19/2024    2:31 PM 09/23/2023    4:30 PM  In your present state of health, do you have any difficulty performing the following activities:  Hearing? 0   Vision? 0   Difficulty concentrating or making decisions? 0   Walking or climbing stairs? 0   Dressing or bathing? 0   Doing errands, shopping? 0 0  Preparing Food and eating ? N   Using the Toilet? N   In the past six months, have you accidently leaked urine? N   Do you have problems with loss of bowel control? N   Managing your Medications? N   Managing your Finances? N   Housekeeping or managing your Housekeeping? N     Patient Care Team: O'Sullivan, Melissa, NP as PCP - General (Internal Medicine)  I have updated your Care Teams any recent Medical Services you may have received from other providers in the past year.     Assessment:   This is a routine wellness examination for Hashir Deleeuw.  Hearing/Vision screen Hearing Screening - Comments:: Denies hearing issues Vision Screening - Comments:: Regular eye exams,    Goals Addressed             This Visit's Progress    Patient Stated       01/22/2024, get use to knee       Depression Screen     01/22/2024    9:33 AM  02/25/2023    9:13 AM 10/07/2022    1:04 PM 08/20/2022    9:03 AM 02/19/2022    9:08 AM  09/27/2021   11:51 AM 08/14/2021    9:02 AM  PHQ 2/9 Scores  PHQ - 2 Score 0 0 0 0 0 0 0  PHQ- 9 Score 1 0         Fall Risk     01/19/2024    2:31 PM 02/25/2023    9:13 AM 09/30/2022   11:34 AM 08/20/2022    9:02 AM 02/19/2022    9:08 AM  Fall Risk   Falls in the past year? 0 0 0 0 0  Number falls in past yr: 0 0 0 0 0  Injury with Fall? 0 0 0 0 0  Risk for fall due to : Medication side effect No Fall Risks No Fall Risks No Fall Risks   Follow up Falls prevention discussed;Falls evaluation completed Falls evaluation completed Falls evaluation completed Falls evaluation completed     MEDICARE RISK AT HOME:  Medicare Risk at Home Any stairs in or around the home?: (Patient-Rptd) No If so, are there any without handrails?: (Patient-Rptd) No Home free of loose throw rugs in walkways, pet beds, electrical cords, etc?: (Patient-Rptd) Yes Adequate lighting in your home to reduce risk of falls?: (Patient-Rptd) Yes Life alert?: (Patient-Rptd) No Use of a cane, walker or w/c?: (Patient-Rptd) No Grab bars in the bathroom?: (Patient-Rptd) Yes Shower chair or bench in shower?: (Patient-Rptd) Yes Elevated toilet seat or a handicapped toilet?: (Patient-Rptd) Yes  TIMED UP AND GO:  Was the test performed?  No  Cognitive Function: 6CIT completed        01/22/2024    9:33 AM 10/07/2022    1:08 PM 09/27/2021   11:59 AM 06/15/2020    9:12 AM 04/29/2019    9:42 AM  6CIT Screen  What Year? 0 points 0 points 0 points 0 points 0 points  What month? 0 points 0 points 0 points 0 points   What time? 0 points 0 points 0 points 0 points 0 points  Count back from 20 0 points 0 points 0 points 0 points 0 points  Months in reverse 0 points 0 points 0 points 0 points 0 points  Repeat phrase 0 points 0 points 0 points 0 points 0 points  Total Score 0 points 0 points 0 points 0 points     Immunizations Immunization History  Administered Date(s) Administered   Influenza Whole 02/26/2010, 03/14/2011    Influenza-Unspecified 02/08/2013, 03/22/2014   PFIZER Comirnaty (Gray Top)Covid-19 Tri-Sucrose Vaccine 12/18/2020, 03/22/2022   PFIZER(Purple Top)SARS-COV-2 Vaccination 07/07/2019, 07/28/2019, 03/13/2020, 07/17/2020   Pfizer Covid-19 Vaccine Bivalent Booster 29yrs & up 03/21/2021   Pfizer(Comirnaty )Fall Seasonal Vaccine 12 years and older 04/16/2023   Pneumococcal Conjugate-13 08/19/2013   Pneumococcal Polysaccharide-23 11/17/2008, 01/13/2018   Td 07/04/2005   Tdap 02/28/2016   Zoster, Live 02/26/2010    Screening Tests Health Maintenance  Topic Date Due   Zoster Vaccines- Shingrix  (1 of 2) 07/11/1995   COVID-19 Vaccine (9 - 2024-25 season) 10/14/2023   Medicare Annual Wellness (AWV)  01/21/2025   DTaP/Tdap/Td (3 - Td or Tdap) 02/27/2026   Pneumococcal Vaccine: 50+ Years  Completed   HPV VACCINES  Aged Out   Meningococcal B Vaccine  Aged Out   INFLUENZA VACCINE  Discontinued   DEXA SCAN  Discontinued   Colonoscopy  Discontinued   Hepatitis C Screening  Discontinued    Health Maintenance  Health Maintenance Due  Topic Date Due   Zoster Vaccines- Shingrix  (1 of 2) 07/11/1995   COVID-19 Vaccine (9 - 2024-25 season) 10/14/2023   Health Maintenance Items Addressed: Thinking about shingles vaccine.  Additional Screening:  Vision Screening: Recommended annual ophthalmology exams for early detection of glaucoma and other disorders of the eye. Would you like a referral to an eye doctor? No    Dental Screening: Recommended annual dental exams for proper oral hygiene  Community Resource Referral / Chronic Care Management: CRR required this visit?  No   CCM required this visit?  No   Plan:    I have personally reviewed and noted the following in the patient's chart:   Medical and social history Use of alcohol, tobacco or illicit drugs  Current medications and supplements including opioid prescriptions. Patient is not currently taking opioid prescriptions. Functional  ability and status Nutritional status Physical activity Advanced directives List of other physicians Hospitalizations, surgeries, and ER visits in previous 12 months Vitals Screenings to include cognitive, depression, and falls Referrals and appointments  In addition, I have reviewed and discussed with patient certain preventive protocols, quality metrics, and best practice recommendations. A written personalized care plan for preventive services as well as general preventive health recommendations were provided to patient.   Ardella FORBES Dawn, LPN   1/[id number]/[id number]   After Visit Summary: (MyChart) Due to this being a telephonic visit, the after visit summary with patients personalized plan was offered to patient via MyChart   Notes: Nothing significant to report at this time.

## 2024-01-22 NOTE — Patient Instructions (Signed)
 Patricia Lee , Thank you for taking time out of your busy schedule to complete your Annual Wellness Visit with me. I enjoyed our conversation and look forward to speaking with you again next year. I, as well as your care team,  appreciate your ongoing commitment to your health goals. Please review the following plan we discussed and let me know if I can assist you in the future. Your Game plan/ To Do List    Referrals: If you haven't heard from the office you've been referred to, please reach out to them at the phone provided.   Follow up Visits: We will see or speak with you next year for your Next Medicare AWV with our clinical staff Have you seen your provider in the last 6 months (3 months if uncontrolled diabetes)? Yes  Clinician Recommendations:  Aim for 30 minutes of exercise or brisk walking, 6-8 glasses of water , and 5 servings of fruits and vegetables each day.       This is a list of the screenings recommended for you:  Health Maintenance  Topic Date Due   Zoster (Shingles) Vaccine (1 of 2) 07/11/1995   COVID-19 Vaccine (9 - 2024-25 season) 10/14/2023   Medicare Annual Wellness Visit  01/21/2025   DTaP/Tdap/Td vaccine (3 - Td or Tdap) 02/27/2026   Pneumococcal Vaccine for age over 4  Completed   HPV Vaccine  Aged Out   Meningitis B Vaccine  Aged Out   Flu Shot  Discontinued   DEXA scan (bone density measurement)  Discontinued   Colon Cancer Screening  Discontinued   Hepatitis C Screening  Discontinued    Advanced directives: (In Chart) A copy of your advanced directives are scanned into your chart should your provider ever need it. Advance Care Planning is important because it:  [x]  Makes sure you receive the medical care that is consistent with your values, goals, and preferences  [x]  It provides guidance to your family and loved ones and reduces their decisional burden about whether or not they are making the right decisions based on your wishes.  Follow the link  provided in your after visit summary or read over the paperwork we have mailed to you to help you started getting your Advance Directives in place. If you need assistance in completing these, please reach out to us  so that we can help you!  See attachments for Preventive Care and Fall Prevention Tips.

## 2024-03-09 ENCOUNTER — Ambulatory Visit: Payer: Self-pay | Admitting: *Deleted

## 2024-03-09 ENCOUNTER — Encounter: Payer: Self-pay | Admitting: Family Medicine

## 2024-03-09 ENCOUNTER — Other Ambulatory Visit (HOSPITAL_BASED_OUTPATIENT_CLINIC_OR_DEPARTMENT_OTHER): Payer: Self-pay

## 2024-03-09 ENCOUNTER — Ambulatory Visit (INDEPENDENT_AMBULATORY_CARE_PROVIDER_SITE_OTHER): Admitting: Family Medicine

## 2024-03-09 VITALS — BP 124/72 | HR 92 | Ht 67.0 in | Wt 169.2 lb

## 2024-03-09 DIAGNOSIS — N3000 Acute cystitis without hematuria: Secondary | ICD-10-CM | POA: Diagnosis not present

## 2024-03-09 LAB — POC URINALSYSI DIPSTICK (AUTOMATED)
Bilirubin, UA: NEGATIVE
Blood, UA: POSITIVE
Glucose, UA: NEGATIVE
Ketones, UA: NEGATIVE
Nitrite, UA: NEGATIVE
Protein, UA: POSITIVE — AB
Spec Grav, UA: 1.01 (ref 1.010–1.025)
Urobilinogen, UA: 0.2 U/dL
pH, UA: 6.5 (ref 5.0–8.0)

## 2024-03-09 MED ORDER — SULFAMETHOXAZOLE-TRIMETHOPRIM 800-160 MG PO TABS
1.0000 | ORAL_TABLET | Freq: Two times a day (BID) | ORAL | 0 refills | Status: DC
  Filled 2024-03-09: qty 6, 3d supply, fill #0

## 2024-03-09 NOTE — Telephone Encounter (Signed)
  FYI Only or Action Required?: FYI only for provider.  Patient was last seen in primary care on 12/23/2023 by Daryl Setter, NP.  Called Nurse Triage reporting Dysuria.  Symptoms began several days ago.  Interventions attempted: Rest, hydration, or home remedies.  Symptoms are: gradually worsening.  Triage Disposition: See HCP Within 4 Hours (Or PCP Triage)  Patient/caregiver understands and will follow disposition?: Yes                Copied from CRM [id number]. Topic: Clinical - Red Word Triage >> Mar 09, 2024  8:41 AM Timindy P wrote: Red Word that prompted transfer to Nurse Triage: Back pain, cloudy urine, urinary urgency Reason for Disposition  Side (flank) or lower back pain present  Answer Assessment - Initial Assessment Questions [facility] None available with PCP or at [facility].       1. SEVERITY: How bad is the pain?  (e.g., Scale 1-10; mild, moderate, or severe)     Moderate low back above hips  2. FREQUENCY: How many times have you had painful urination today?      None  3. PATTERN: Is pain present every time you urinate or just sometimes?      na 4. ONSET: When did the painful urination start?      No pain with urination last Thursday  5. FEVER: Do you have a fever? If Yes, ask: What is your temperature, how was it measured, and when did it start?     na 6. PAST UTI: Have you had a urine infection before? If Yes, ask: When was the last time? and What happened that time?      Many years  ago  7. CAUSE: What do you think is causing the painful urination?  (e.g., UTI, scratch, Herpes sore)     UTI  8. OTHER SYMPTOMS: Do you have any other symptoms? (e.g., blood in urine, flank pain, genital sores, urgency, vaginal discharge)     Cloudy urine , urinary urgency , back pain  9. PREGNANCY: Is there any chance you are pregnant? When was your last menstrual period?      na  Protocols used: Urination Pain - Female-A-AH

## 2024-03-09 NOTE — Progress Notes (Signed)
 [facility] [address] [place] [address] Dept: [phone] Dept Fax: [phone]  Office Visit  Subjective:    Patient ID: Patricia Lee, female    DOB: 08-Jun-1946, 78 y.o..   MRN: [id number]  Chief Complaint  Patient presents with   Urinary Tract Infection   History of Present Illness:  Patient is in today with a 3-day history of bilateral low back pain and a cloudy appearing urine. She notes that last night she was up three times to urinate. She has not had any dysuria. She has had a prior UTI, but some years ago. She notes she is scheduled for a dental crown next week. Since she had a prior knee joint replacement, she was prescribed amoxicillin  to take this weekend.  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   History of skin cancer 07/28/2012   HERPES ZOSTER 11/27/2008   PREMATURE VENTRICULAR CONTRACTIONS 11/17/2008   Essential hypertension 09/10/2007   Past Surgical History:  Procedure Laterality Date   COLONOSCOPY     JOINT REPLACEMENT  Right hip   POLYPECTOMY     TOTAL HIP ARTHROPLASTY Right 04/09/2016   Procedure: RIGHT TOTAL HIP ARTHROPLASTY ANTERIOR APPROACH;  Surgeon: Donnice Car, MD;  Location: [facility];  Service: Orthopedics;  Laterality: Right;   TOTAL KNEE ARTHROPLASTY Left 09/23/2023   Procedure: ARTHROPLASTY, KNEE, TOTAL;  Surgeon: Car Donnice, MD;  Location: [facility];  Service: Orthopedics;  Laterality: Left;   Family History  Problem Relation Age of Onset    Stomach cancer Mother        died at 39   Arthritis Mother    Cancer Mother    Varicose Veins Mother    Colon cancer Father        died at 31   Cancer Father    Heart failure Maternal Grandmother    Cancer Paternal Grandmother    Colon polyps Neg Hx    Rectal cancer Neg Hx    Esophageal cancer Neg Hx    Pancreatic cancer Neg Hx    Liver disease Neg Hx    Outpatient Medications Prior to Visit  Medication Sig Dispense Refill   amLODipine  (NORVASC ) 2.5 MG tablet TAKE 1 TABLET BY MOUTH TWICE  DAILY 180 tablet 3   amoxicillin  (AMOXIL ) 500 MG tablet Take 4 tablets by mouth 1 hour prior to dental work 4 tablet 2   APPLE CIDER VINEGAR PO Take 1 tablet by mouth daily.     atorvastatin  (LIPITOR) 10 MG tablet TAKE 1 TABLET BY MOUTH DAILY 100 tablet 1   betamethasone  dipropionate 0.05 % cream Apply topically 2 (two) times daily. (Patient taking differently: Apply 1 Application topically daily as needed (flare up).) 30 g 0   chlorthalidone  (HYGROTON ) 25 MG tablet Take 1 tablet (25 mg total) by mouth daily. 90 tablet 1   Cholecalciferol (VITAMIN D) 50 MCG (2000 UT) tablet Take 2,000 Units by mouth daily.     Collagen-Vitamin C-Biotin (COLLAGEN PO) Take 2,500 mg by mouth daily.     gabapentin  (NEURONTIN )  300 MG capsule Take 1 capsule (300 mg total) by mouth once daily as needed (back pain). 30 capsule 1   Multiple Vitamins-Minerals (MULTIVITAMIN WITH MINERALS) tablet Take 1 tablet by mouth daily.     Omega-3 Fatty Acids (CVS FISH OIL) 1200 MG CAPS Take 1,200 mg by mouth daily.     polycarbophil (FIBERCON) 625 MG tablet Take 625 mg by mouth daily.     Turmeric 500 MG CAPS Take 500 mg by mouth daily.     No facility-administered medications prior to visit.   Allergies  Allergen Reactions   Codeine Other (See Comments)    Tachycardia and insomnia   Erythromycin Other (See Comments)    Tachycardia and insomnia   Influenza Vaccines Other (See Comments)    Nerve reaction similar to shingles- all  nerves in back on fire    Prednisone Other (See Comments)    Tachycardia and insomnia   Tetracyclines & Related Other (See Comments)    Tachycardia/insomnia     Objective:   Today's Vitals   03/09/24 1029  BP: 124/72  Pulse: 92  SpO2: 95%  Weight: 169 lb 3.2 oz (76.7 kg)  Height: 5' 7 (1.702 m)   Body mass index is 26.5 kg/m.   General: Well developed, well nourished. No acute distress. Psych: Alert and oriented. Normal mood and affect.  Health Maintenance Due  Topic Date Due   Zoster Vaccines- Shingrix  (1 of 2) 07/11/1995   COVID-19 Vaccine (9 - 2024-25 season) 02/09/2024   Lab Results Component Ref Range & Units (hover) 10:34 (03/09/24)  Color, UA yellow  Clarity, UA clear  Glucose, UA Negative  Bilirubin, UA neg  Ketones, UA neg  Spec Grav, UA 1.010  Blood, UA pos  pH, UA 6.5  Protein, UA Positive Abnormal   Urobilinogen, UA 0.2  Nitrite, UA neg  Leukocytes, UA Large (3+) Abnormal      Assessment & Plan:   Problem List Items Addressed This Visit   None Visit Diagnoses       Acute cystitis without hematuria    -  Primary   Symptoms and urine dipstick c/w acute UTI. I will send urine for culture. I will prescribed 3-days of Septra . Discussed use of Azo if needed.   Relevant Medications   sulfamethoxazole -trimethoprim  (BACTRIM  DS) 800-160 MG tablet   Other Relevant Orders   POCT Urinalysis Dipstick (Automated) (Completed)   Urine Culture       Return if symptoms worsen or fail to improve.   Garnette CHRISTELLA Simpler, MD

## 2024-03-09 NOTE — Patient Instructions (Signed)

## 2024-03-10 ENCOUNTER — Other Ambulatory Visit (HOSPITAL_BASED_OUTPATIENT_CLINIC_OR_DEPARTMENT_OTHER): Payer: Self-pay

## 2024-03-12 ENCOUNTER — Ambulatory Visit: Payer: Self-pay | Admitting: Family Medicine

## 2024-03-12 LAB — URINE CULTURE
MICRO NUMBER:: 17036213
SPECIMEN QUALITY:: ADEQUATE

## 2024-03-29 ENCOUNTER — Encounter: Payer: Self-pay | Admitting: Family

## 2024-03-30 MED ORDER — AMOXICILLIN 500 MG PO TABS: 2000.0000 mg | ORAL_TABLET | Freq: Every day | ORAL | 0 refills | Status: AC

## 2024-05-09 ENCOUNTER — Other Ambulatory Visit: Payer: Self-pay | Admitting: Family

## 2024-05-09 DIAGNOSIS — I10 Essential (primary) hypertension: Secondary | ICD-10-CM

## 2024-06-07 ENCOUNTER — Other Ambulatory Visit: Payer: Self-pay | Admitting: Family

## 2024-06-29 ENCOUNTER — Ambulatory Visit: Admitting: Family

## 2024-07-11 ENCOUNTER — Encounter: Payer: Self-pay | Admitting: Family

## 2024-07-13 ENCOUNTER — Telehealth: Admitting: Family

## 2024-07-13 ENCOUNTER — Encounter: Payer: Self-pay | Admitting: Family

## 2024-07-13 VITALS — BP 134/72

## 2024-07-13 DIAGNOSIS — E785 Hyperlipidemia, unspecified: Secondary | ICD-10-CM

## 2024-07-13 DIAGNOSIS — R7303 Prediabetes: Secondary | ICD-10-CM

## 2024-07-13 DIAGNOSIS — M545 Low back pain, unspecified: Secondary | ICD-10-CM

## 2024-07-13 DIAGNOSIS — Z96652 Presence of left artificial knee joint: Secondary | ICD-10-CM

## 2024-07-13 DIAGNOSIS — Z85828 Personal history of other malignant neoplasm of skin: Secondary | ICD-10-CM

## 2024-07-13 DIAGNOSIS — I10 Essential (primary) hypertension: Secondary | ICD-10-CM

## 2024-07-13 NOTE — Assessment & Plan Note (Signed)
She continues to follow regularly with dermatology.

## 2024-07-13 NOTE — Patient Instructions (Signed)
" °  VISIT SUMMARY: During your visit, we reviewed your medications and discussed your overall health. Your last A1c was 6.2, which is below the target of 6.5, indicating good control of your blood sugar levels. We also discussed your hypertension, cholesterol levels, and follow-up appointments with dermatology and orthopedics.  YOUR PLAN: -PREDIABETES: Prediabetes means your blood sugar levels are higher than normal but not high enough to be classified as diabetes. Your A1c is currently 6.2, which is below the target of 6.5. We have ordered an A1c test with your next lab draw and advised you to fast before your lab appointment for accurate triglyceride levels.  -ESSENTIAL HYPERTENSION: Essential hypertension is high blood pressure with no identifiable cause. Your blood pressure is well controlled at 134/72 mmHg with your current medications. Continue taking chlorthalidone  and amlodipine  as prescribed. We have also ordered kidney function and electrolytes tests with your next lab draw.  -HYPERLIPIDEMIA: Hyperlipidemia means you have high levels of fats (lipids) in your blood. Your cholesterol levels were stable a year ago. We have ordered a cholesterol test with your next lab draw to monitor your levels.  -HISTORY OF SKIN CANCER: You have a history of skin cancer and are under regular dermatological care. Your next appointment is in July, and we have provided a referral for your skin check.  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 You plan to schedule an orthopedic appointment, and we have provided a referral for follow-up on your left knee replacement.  -GENERAL HEALTH MAINTENANCE: You are up to date with your medications and have a sufficient supply of gabapentin  for back pain as needed. We have scheduled a lab appointment and a six-month follow-up to monitor your health.  INSTRUCTIONS: Please  schedule your lab appointment and ensure you fast before the appointment for accurate triglyceride levels. Follow up with dermatology in July for your skin check and with orthopedics within the next two weeks for your left knee replacement follow-up. Continue taking your medications as prescribed and maintain your physical activity routine.    Contains text generated by [employer].   "

## 2024-07-13 NOTE — Assessment & Plan Note (Signed)
 Currently stable. Managed with prn gabapentin .

## 2024-07-13 NOTE — Assessment & Plan Note (Signed)
 Lab Results  Component Value Date   HGBA1C 6.2 12/23/2023   HGBA1C 5.7 (H) 09/11/2023   HGBA1C 6.3 06/24/2023   Lab Results  Component Value Date   LDLCALC 91 06/24/2023   CREATININE 0.99 01/01/2024

## 2024-07-13 NOTE — Assessment & Plan Note (Addendum)
 Lab Results  Component Value Date   CHOL 193 06/24/2023   HDL 84.70 06/24/2023   LDLCALC 91 06/24/2023   LDLDIRECT 136.2 07/21/2012   TRIG 83.0 06/24/2023   CHOLHDL 2 06/24/2023   Continues atorvastatin .  Stopped apple cider vinegar and fish oil.

## 2024-07-13 NOTE — Assessment & Plan Note (Signed)
 Dur for 1 year follow up in April with Dr. Ernie.

## 2024-07-20 ENCOUNTER — Other Ambulatory Visit

## 2025-01-11 ENCOUNTER — Ambulatory Visit: Admitting: Family

## 2025-01-27 ENCOUNTER — Ambulatory Visit
